# Patient Record
Sex: Female | Born: 1935 | Race: Black or African American | Hispanic: No | Marital: Married | Smoking: Never smoker
Health system: Southern US, Community
[De-identification: ages and names within clinical notes are randomized; demographics above are authoritative.]

## PROBLEM LIST (undated history)

## (undated) DIAGNOSIS — M65312 Trigger thumb, left thumb: Secondary | ICD-10-CM

## (undated) DIAGNOSIS — E213 Hyperparathyroidism, unspecified: Secondary | ICD-10-CM

## (undated) DIAGNOSIS — E7800 Pure hypercholesterolemia, unspecified: Secondary | ICD-10-CM

## (undated) DIAGNOSIS — I10 Essential (primary) hypertension: Secondary | ICD-10-CM

## (undated) DIAGNOSIS — J302 Other seasonal allergic rhinitis: Secondary | ICD-10-CM

## (undated) HISTORY — PX: TRIGGER FINGER RELEASE: SHX641

## (undated) HISTORY — PX: OTHER SURGICAL HISTORY: SHX169

---

## 1973-10-05 HISTORY — PX: ABDOMINAL HYSTERECTOMY: SHX81

## 1998-08-01 ENCOUNTER — Ambulatory Visit (HOSPITAL_COMMUNITY): Admission: RE | Admit: 1998-08-01 | Discharge: 1998-08-01 | Payer: Self-pay | Admitting: Internal Medicine

## 1998-08-01 ENCOUNTER — Encounter: Payer: Self-pay | Admitting: Internal Medicine

## 1999-04-03 ENCOUNTER — Ambulatory Visit (HOSPITAL_BASED_OUTPATIENT_CLINIC_OR_DEPARTMENT_OTHER): Admission: RE | Admit: 1999-04-03 | Discharge: 1999-04-03 | Payer: Self-pay | Admitting: Orthopedic Surgery

## 1999-08-14 ENCOUNTER — Ambulatory Visit (HOSPITAL_COMMUNITY): Admission: RE | Admit: 1999-08-14 | Discharge: 1999-08-14 | Payer: Self-pay | Admitting: Internal Medicine

## 1999-08-14 ENCOUNTER — Encounter: Payer: Self-pay | Admitting: Internal Medicine

## 2002-01-03 ENCOUNTER — Other Ambulatory Visit: Admission: RE | Admit: 2002-01-03 | Discharge: 2002-01-03 | Payer: Self-pay | Admitting: *Deleted

## 2003-02-12 ENCOUNTER — Ambulatory Visit (HOSPITAL_COMMUNITY): Admission: RE | Admit: 2003-02-12 | Discharge: 2003-02-12 | Payer: Self-pay | Admitting: Internal Medicine

## 2003-02-12 ENCOUNTER — Encounter: Payer: Self-pay | Admitting: Internal Medicine

## 2003-02-13 ENCOUNTER — Other Ambulatory Visit: Admission: RE | Admit: 2003-02-13 | Discharge: 2003-02-13 | Payer: Self-pay | Admitting: *Deleted

## 2003-11-19 ENCOUNTER — Ambulatory Visit (HOSPITAL_COMMUNITY): Admission: RE | Admit: 2003-11-19 | Discharge: 2003-11-19 | Payer: Self-pay | Admitting: Internal Medicine

## 2004-02-13 ENCOUNTER — Other Ambulatory Visit: Admission: RE | Admit: 2004-02-13 | Discharge: 2004-02-13 | Payer: Self-pay | Admitting: *Deleted

## 2007-09-12 ENCOUNTER — Other Ambulatory Visit: Admission: RE | Admit: 2007-09-12 | Discharge: 2007-09-12 | Payer: Self-pay | Admitting: *Deleted

## 2008-10-17 ENCOUNTER — Other Ambulatory Visit: Admission: RE | Admit: 2008-10-17 | Discharge: 2008-10-17 | Payer: Self-pay | Admitting: Gynecology

## 2011-12-02 ENCOUNTER — Ambulatory Visit (HOSPITAL_COMMUNITY)
Admission: RE | Admit: 2011-12-02 | Discharge: 2011-12-02 | Disposition: A | Discharge status: Home / Self Care | Payer: PRIVATE HEALTH INSURANCE | Source: Ambulatory Visit | Attending: Internal Medicine | Admitting: Internal Medicine

## 2011-12-02 DIAGNOSIS — M7989 Other specified soft tissue disorders: Secondary | ICD-10-CM

## 2011-12-02 DIAGNOSIS — R609 Edema, unspecified: Secondary | ICD-10-CM

## 2011-12-02 DIAGNOSIS — M79609 Pain in unspecified limb: Secondary | ICD-10-CM

## 2011-12-02 NOTE — Progress Notes (Signed)
Bilateral lower extremity venous duplex completed.  Preliminary report is negative for DVT, SVT, or a Baker's cyst. 

## 2012-12-03 DIAGNOSIS — M65312 Trigger thumb, left thumb: Secondary | ICD-10-CM

## 2012-12-03 HISTORY — DX: Trigger thumb, left thumb: M65.312

## 2012-12-06 ENCOUNTER — Other Ambulatory Visit: Payer: Self-pay | Admitting: Orthopedic Surgery

## 2012-12-07 ENCOUNTER — Other Ambulatory Visit: Payer: Self-pay | Admitting: Orthopedic Surgery

## 2012-12-08 ENCOUNTER — Other Ambulatory Visit: Payer: Self-pay

## 2012-12-08 ENCOUNTER — Encounter (HOSPITAL_BASED_OUTPATIENT_CLINIC_OR_DEPARTMENT_OTHER): Payer: Self-pay | Admitting: *Deleted

## 2012-12-08 ENCOUNTER — Encounter (HOSPITAL_BASED_OUTPATIENT_CLINIC_OR_DEPARTMENT_OTHER)
Admission: RE | Admit: 2012-12-08 | Discharge: 2012-12-08 | Disposition: A | Discharge status: Home / Self Care | Payer: Medicare Other | Source: Ambulatory Visit | Attending: Orthopedic Surgery | Admitting: Orthopedic Surgery

## 2012-12-08 LAB — BASIC METABOLIC PANEL
BUN: 14 mg/dL (ref 6–23)
Chloride: 102 mEq/L (ref 96–112)
Creatinine, Ser: 0.71 mg/dL (ref 0.50–1.10)
GFR calc Af Amer: >90 mL/min (ref >90)
Glucose, Bld: 98 mg/dL (ref 70–99)
Potassium: 3.6 mEq/L (ref 3.5–5.1)

## 2012-12-08 NOTE — Pre-Procedure Instructions (Signed)
To come for BMET and EKG 

## 2012-12-12 ENCOUNTER — Ambulatory Visit (HOSPITAL_BASED_OUTPATIENT_CLINIC_OR_DEPARTMENT_OTHER)
Admission: RE | Admit: 2012-12-12 | Discharge: 2012-12-12 | Disposition: A | Discharge status: Home / Self Care | Payer: Medicare Other | Source: Ambulatory Visit | Attending: Orthopedic Surgery | Admitting: Orthopedic Surgery

## 2012-12-12 ENCOUNTER — Encounter (HOSPITAL_BASED_OUTPATIENT_CLINIC_OR_DEPARTMENT_OTHER): Payer: Self-pay | Admitting: Anesthesiology

## 2012-12-12 ENCOUNTER — Encounter (HOSPITAL_BASED_OUTPATIENT_CLINIC_OR_DEPARTMENT_OTHER): Payer: Self-pay | Admitting: *Deleted

## 2012-12-12 ENCOUNTER — Encounter (HOSPITAL_BASED_OUTPATIENT_CLINIC_OR_DEPARTMENT_OTHER)
Admission: RE | Disposition: A | Discharge status: Home / Self Care | Payer: Self-pay | Source: Ambulatory Visit | Attending: Orthopedic Surgery

## 2012-12-12 ENCOUNTER — Ambulatory Visit (HOSPITAL_BASED_OUTPATIENT_CLINIC_OR_DEPARTMENT_OTHER): Payer: Medicare Other | Admitting: Anesthesiology

## 2012-12-12 DIAGNOSIS — J309 Allergic rhinitis, unspecified: Secondary | ICD-10-CM | POA: Insufficient documentation

## 2012-12-12 DIAGNOSIS — I10 Essential (primary) hypertension: Secondary | ICD-10-CM | POA: Insufficient documentation

## 2012-12-12 DIAGNOSIS — Z7982 Long term (current) use of aspirin: Secondary | ICD-10-CM | POA: Insufficient documentation

## 2012-12-12 DIAGNOSIS — Z79899 Other long term (current) drug therapy: Secondary | ICD-10-CM | POA: Insufficient documentation

## 2012-12-12 DIAGNOSIS — M6530 Trigger finger, unspecified finger: Secondary | ICD-10-CM | POA: Insufficient documentation

## 2012-12-12 HISTORY — PX: TRIGGER FINGER RELEASE: SHX641

## 2012-12-12 HISTORY — DX: Other seasonal allergic rhinitis: J30.2

## 2012-12-12 HISTORY — DX: Trigger thumb, left thumb: M65.312

## 2012-12-12 HISTORY — DX: Essential (primary) hypertension: I10

## 2012-12-12 HISTORY — DX: Pure hypercholesterolemia, unspecified: E78.00

## 2012-12-12 SURGERY — RELEASE, A1 PULLEY, FOR TRIGGER FINGER
Anesthesia: General | Site: Thumb | Laterality: Left | Wound class: Clean

## 2012-12-12 MED ORDER — ONDANSETRON HCL 4 MG/2ML IJ SOLN
INTRAMUSCULAR | Status: DC | PRN
  Administered 2012-12-12: 4 mg via INTRAVENOUS

## 2012-12-12 MED ORDER — FENTANYL CITRATE 0.05 MG/ML IJ SOLN
INTRAMUSCULAR | Status: DC | PRN
  Administered 2012-12-12: 50 ug via INTRAVENOUS

## 2012-12-12 MED ORDER — CHLORHEXIDINE GLUCONATE 4 % EX LIQD: 60.0000 mL | Freq: Once | CUTANEOUS | Status: DC

## 2012-12-12 MED ORDER — MIDAZOLAM HCL 2 MG/2ML IJ SOLN: 1.0000 mg | INTRAMUSCULAR | Status: DC | PRN

## 2012-12-12 MED ORDER — OXYCODONE HCL 5 MG PO TABS: 5.0000 mg | ORAL_TABLET | Freq: Once | ORAL | Status: DC | PRN

## 2012-12-12 MED ORDER — MIDAZOLAM HCL 2 MG/ML PO SYRP: 12.0000 mg | ORAL_SOLUTION | Freq: Once | ORAL | Status: DC | PRN

## 2012-12-12 MED ORDER — LACTATED RINGERS IV SOLN
INTRAVENOUS | Status: DC
  Administered 2012-12-12: 13:00:00 via INTRAVENOUS

## 2012-12-12 MED ORDER — CEFAZOLIN SODIUM-DEXTROSE 2-3 GM-% IV SOLR
2.0000 g | INTRAVENOUS | Status: DC
  Administered 2012-12-12: 2 g via INTRAVENOUS

## 2012-12-12 MED ORDER — PROPOFOL 10 MG/ML IV EMUL
INTRAVENOUS | Status: DC | PRN
  Administered 2012-12-12: 50 ug/kg/min via INTRAVENOUS

## 2012-12-12 MED ORDER — FENTANYL CITRATE 0.05 MG/ML IJ SOLN: 50.0000 ug | INTRAMUSCULAR | Status: DC | PRN

## 2012-12-12 MED ORDER — OXYCODONE HCL 5 MG/5ML PO SOLN: 5.0000 mg | Freq: Once | ORAL | Status: DC | PRN

## 2012-12-12 MED ORDER — DEXAMETHASONE SODIUM PHOSPHATE 4 MG/ML IJ SOLN
INTRAMUSCULAR | Status: DC | PRN
  Administered 2012-12-12: 10 mg via INTRAVENOUS

## 2012-12-12 MED ORDER — BUPIVACAINE HCL (PF) 0.25 % IJ SOLN
INTRAMUSCULAR | Status: DC | PRN
  Administered 2012-12-12: 5 mL

## 2012-12-12 MED ORDER — CEFAZOLIN SODIUM-DEXTROSE 2-3 GM-% IV SOLR: 2.0000 g | INTRAVENOUS | Status: DC

## 2012-12-12 MED ORDER — LIDOCAINE HCL (PF) 0.5 % IJ SOLN
INTRAMUSCULAR | Status: DC | PRN
  Administered 2012-12-12: 35 mL via INTRAVENOUS

## 2012-12-12 MED ORDER — HYDROMORPHONE HCL PF 1 MG/ML IJ SOLN: 0.2500 mg | INTRAMUSCULAR | Status: DC | PRN

## 2012-12-12 MED ORDER — LIDOCAINE HCL (CARDIAC) 20 MG/ML IV SOLN
INTRAVENOUS | Status: DC | PRN
  Administered 2012-12-12: 30 mg via INTRAVENOUS

## 2012-12-12 MED ORDER — HYDROCODONE-ACETAMINOPHEN 5-325 MG PO TABS: 1.0000 | ORAL_TABLET | Freq: Four times a day (QID) | ORAL | Status: DC | PRN

## 2012-12-12 SURGICAL SUPPLY — 35 items
BANDAGE COBAN STERILE 2 (GAUZE/BANDAGES/DRESSINGS) ×2 IMPLANT
BLADE SURG 15 STRL LF DISP TIS (BLADE) ×1 IMPLANT
BLADE SURG 15 STRL SS (BLADE) ×1
BNDG ESMARK 4X9 LF (GAUZE/BANDAGES/DRESSINGS) IMPLANT
CHLORAPREP W/TINT 26ML (MISCELLANEOUS) ×2 IMPLANT
CLOTH BEACON ORANGE TIMEOUT ST (SAFETY) ×2 IMPLANT
CORDS BIPOLAR (ELECTRODE) IMPLANT
COVER MAYO STAND STRL (DRAPES) ×2 IMPLANT
COVER TABLE BACK 60X90 (DRAPES) ×2 IMPLANT
CUFF TOURNIQUET SINGLE 18IN (TOURNIQUET CUFF) ×2 IMPLANT
DECANTER SPIKE VIAL GLASS SM (MISCELLANEOUS) IMPLANT
DRAPE EXTREMITY T 121X128X90 (DRAPE) ×2 IMPLANT
DRAPE SURG 17X23 STRL (DRAPES) ×2 IMPLANT
GAUZE XEROFORM 1X8 LF (GAUZE/BANDAGES/DRESSINGS) ×2 IMPLANT
GLOVE BIO SURGEON STRL SZ 6.5 (GLOVE) IMPLANT
GLOVE BIOGEL PI IND STRL 8.5 (GLOVE) ×1 IMPLANT
GLOVE BIOGEL PI INDICATOR 8.5 (GLOVE) ×1
GLOVE SKINSENSE NS SZ7.0 (GLOVE) ×1
GLOVE SKINSENSE STRL SZ7.0 (GLOVE) ×1 IMPLANT
GLOVE SURG ORTHO 8.0 STRL STRW (GLOVE) ×2 IMPLANT
GOWN BRE IMP PREV XXLGXLNG (GOWN DISPOSABLE) ×2 IMPLANT
GOWN PREVENTION PLUS XLARGE (GOWN DISPOSABLE) ×2 IMPLANT
NEEDLE 27GAX1X1/2 (NEEDLE) ×2 IMPLANT
NS IRRIG 1000ML POUR BTL (IV SOLUTION) ×2 IMPLANT
PACK BASIN DAY SURGERY FS (CUSTOM PROCEDURE TRAY) ×2 IMPLANT
PADDING CAST ABS 4INX4YD NS (CAST SUPPLIES) ×1
PADDING CAST ABS COTTON 4X4 ST (CAST SUPPLIES) ×1 IMPLANT
SPONGE GAUZE 4X4 12PLY (GAUZE/BANDAGES/DRESSINGS) ×2 IMPLANT
STOCKINETTE 4X48 STRL (DRAPES) ×2 IMPLANT
SUT VICRYL RAPIDE 4/0 PS 2 (SUTURE) ×2 IMPLANT
SYR BULB 3OZ (MISCELLANEOUS) ×2 IMPLANT
SYR CONTROL 10ML LL (SYRINGE) ×2 IMPLANT
TOWEL OR 17X24 6PK STRL BLUE (TOWEL DISPOSABLE) ×4 IMPLANT
UNDERPAD 30X30 INCONTINENT (UNDERPADS AND DIAPERS) ×2 IMPLANT
WATER STERILE IRR 1000ML POUR (IV SOLUTION) IMPLANT

## 2012-12-12 NOTE — Anesthesia Postprocedure Evaluation (Signed)
  Anesthesia Post-op Note  Patient: Erica Meadows  Procedure(s) Performed: Procedure(s) with comments: RELEASE TRIGGER FINGER/A-1 PULLEY LEFT THUMB (Left) - ANESTHESIA: IV REGIONAL FAB  Patient Location: PACU  Anesthesia Type:MAC and Bier block  Level of Consciousness: awake and alert   Airway and Oxygen Therapy: Patient Spontanous Breathing  Post-op Pain: mild  Post-op Assessment: Post-op Vital signs reviewed, Patient's Cardiovascular Status Stable, Respiratory Function Stable, Patent Airway, No signs of Nausea or vomiting, Adequate PO intake and Pain level controlled  Post-op Vital Signs: stable  Complications: No apparent anesthesia complications

## 2012-12-12 NOTE — Op Note (Signed)
Other Dictation: Dictation Number [id number]

## 2012-12-12 NOTE — Brief Op Note (Signed)
12/12/2012  2:17 PM  PATIENT:  Erica Meadows  77 y.o. female  PRE-OPERATIVE DIAGNOSIS:  LOCKED LEFT TRIGGER THUMB  POST-OPERATIVE DIAGNOSIS:  LOCKED LEFT TRIGGER THUMB  PROCEDURE:  Procedure(s) with comments: RELEASE TRIGGER FINGER/A-1 PULLEY LEFT THUMB (Left) - ANESTHESIA: IV REGIONAL FAB  SURGEON:  Surgeon(s) and Role:    * Nicki Reaper, MD - Primary  PHYSICIAN ASSISTANT:   ASSISTANTS: none   ANESTHESIA:   local and regional  EBL:  Total I/O In: 600 [I.V.:600] Out: -   BLOOD ADMINISTERED:none  DRAINS: none   LOCAL MEDICATIONS USED:  MARCAINE     SPECIMEN:  No Specimen  DISPOSITION OF SPECIMEN:  N/A  COUNTS:  YES  TOURNIQUET:   Total Tourniquet Time Documented: Forearm (Left) - 17 minutes Total: Forearm (Left) - 17 minutes   DICTATION: .Other Dictation: Dictation Number [id number]  PLAN OF CARE: Discharge to home after PACU  PATIENT DISPOSITION:  PACU - hemodynamically stable.

## 2012-12-12 NOTE — H&P (Signed)
Erica Meadows is 77 years-old.  She is right-hand dominant.  She complains of her left thumb being unable to bend. This has been going on for two months.  She is status post release of her A-1 pulley by myself ten years ago.  She has no history of injury on her left side. She has no history of diabetes, thyroid problems, arthritis or gout.  She complains of an intermittent, moderate, throbbing pain. She states it had popped for a period of time, but now no longer moves and her pain has disappeared.  She states that she can fully flex it with using her opposite hand.  Activity makes it worse, rest makes it better.    ALLERGIES:  None.  MEDICATIONS:   Aspirin, Crestor, Atenolol, Losartan, amlodipine, furosemide.  FAMILY MEDICAL HISTORY:   Negative.  SOCIAL HISTORY:   She does not smoke or drink, she is retired.  REVIEW OF SYSTEMS:   Positive for glasses, high blood pressure, otherwise negative 14 points.  Erica Meadows is an 77 y.o. female.   Chief Complaint: sts lt thumb HPI: see above  Past Medical History  Diagnosis Date  . High cholesterol   . Hypertension     under control; has been on med. x 15-20 yr.  . Seasonal allergies   . Trigger thumb of left hand 12/2012    Past Surgical History  Procedure Laterality Date  . Trigger finger release Right   . Abdominal hysterectomy  1975    partial    History reviewed. No pertinent family history. Social History:  reports that she has never smoked. She has never used smokeless tobacco. She reports that she does not drink alcohol or use illicit drugs.  Allergies: No Known Allergies  Medications Prior to Admission  Medication Sig Dispense Refill  . amLODipine (NORVASC) 10 MG tablet Take 10 mg by mouth daily.      Marland Kitchen aspirin 81 MG tablet Take 81 mg by mouth daily.      Marland Kitchen atenolol (TENORMIN) 25 MG tablet Take 25 mg by mouth daily.      . furosemide (LASIX) 20 MG tablet Take 20 mg by mouth 2 (two) times daily.      Marland Kitchen  losartan-hydrochlorothiazide (HYZAAR) 100-12.5 MG per tablet Take 1 tablet by mouth daily.      . rosuvastatin (CRESTOR) 10 MG tablet Take 10 mg by mouth daily.        Results for orders placed during the hospital encounter of 12/12/12 (from the past 48 hour(s))  POCT HEMOGLOBIN-HEMACUE     Status: None   Collection Time    12/12/12 12:55 PM      Result Value Range   Hemoglobin 13.7  12.0 - 15.0 g/dL    No results found.   Pertinent items are noted in HPI.  Blood pressure 148/67, pulse 58, temperature 98.5 F (36.9 C), temperature source Oral, resp. rate 16, height 5\' 2"  (1.575 m), weight 66.792 kg (147 lb 4 oz), SpO2 98.00%.  General appearance: alert, cooperative and appears stated age Head: Normocephalic, without obvious abnormality Neck: no JVD Resp: clear to auscultation bilaterally Cardio: regular rate and rhythm, S1, S2 normal, no murmur, click, rub or gallop GI: soft, non-tender; bowel sounds normal; no masses,  no organomegaly Extremities: normal Pulses: 2+ and symmetric Skin: Skin color, texture, turgor normal. No rashes or lesions Neurologic: Grossly normal Incision/Wound: na  Assessment/Plan DIAGNOSIS:   Stenosing tenosynovitis, locked left thumb trigger.    RECOMMENDATIONS/PLAN:  She would  like to have this surgically released and is advised of the risks and complications including infection, recurrence, injury to arteries, nerves, tendons, incomplete relief of symptoms and dystrophy.  She is scheduled for release A-1 pulley left thumb as an outpatient under regional anesthesia.  KUZMA,GARY R 12/12/2012, 1:42 PM

## 2012-12-12 NOTE — Anesthesia Procedure Notes (Signed)
Procedure Name: MAC Performed by: PEARSON, DONNA W Pre-anesthesia Checklist: Patient identified, Timeout performed, Emergency Drugs available, Suction available and Patient being monitored Patient Re-evaluated:Patient Re-evaluated prior to inductionOxygen Delivery Method: Simple face mask Placement Confirmation: positive ETCO2     

## 2012-12-12 NOTE — Transfer of Care (Signed)
Immediate Anesthesia Transfer of Care Note  Patient: Erica Meadows  Procedure(s) Performed: Procedure(s) with comments: RELEASE TRIGGER FINGER/A-1 PULLEY LEFT THUMB (Left) - ANESTHESIA: IV REGIONAL FAB  Patient Location: PACU  Anesthesia Type:MAC and Bier block  Level of Consciousness: awake, alert  and oriented  Airway & Oxygen Therapy: Patient Spontanous Breathing and Patient connected to face mask oxygen  Post-op Assessment: Report given to PACU RN and Post -op Vital signs reviewed and stable  Post vital signs: Reviewed and stable  Complications: No apparent anesthesia complications

## 2012-12-12 NOTE — Anesthesia Preprocedure Evaluation (Addendum)
Anesthesia Evaluation  Patient identified by MRN, date of birth, ID band Patient awake    Reviewed: Allergy & Precautions, H&P , NPO status , Patient's Chart, lab work & pertinent test results  Airway Mallampati: I TM Distance: >3 FB Neck ROM: Full    Dental   Pulmonary  breath sounds clear to auscultation        Cardiovascular hypertension, Rhythm:Regular Rate:Normal     Neuro/Psych    GI/Hepatic   Endo/Other    Renal/GU      Musculoskeletal   Abdominal   Peds  Hematology   Anesthesia Other Findings   Reproductive/Obstetrics                           Anesthesia Physical Anesthesia Plan  ASA: II  Anesthesia Plan: MAC and Bier Block   Post-op Pain Management:    Induction: Intravenous  Airway Management Planned: Simple Face Mask  Additional Equipment:   Intra-op Plan:   Post-operative Plan:   Informed Consent: I have reviewed the patients History and Physical, chart, labs and discussed the procedure including the risks, benefits and alternatives for the proposed anesthesia with the patient or authorized representative who has indicated his/her understanding and acceptance.     Plan Discussed with: CRNA and Surgeon  Anesthesia Plan Comments:        Anesthesia Quick Evaluation

## 2012-12-13 ENCOUNTER — Encounter (HOSPITAL_BASED_OUTPATIENT_CLINIC_OR_DEPARTMENT_OTHER): Payer: Self-pay | Admitting: Orthopedic Surgery

## 2012-12-13 NOTE — Op Note (Signed)
NAMEARMANDINA, Erica Meadows              ACCOUNT NO.:  [id number]  MEDICAL RECORD NO.:  [id number]  LOCATION:                                 FACILITY:  PHYSICIAN:  Cindee Salt, M.D.            DATE OF BIRTH:  DATE OF PROCEDURE:  12/12/2012 DATE OF DISCHARGE:                              OPERATIVE REPORT   PREOPERATIVE DIAGNOSIS:  Stenosing tenosynovitis, left thumb.  POSTOPERATIVE DIAGNOSIS:  Stenosing tenosynovitis, left thumb.  OPERATION:  Release A1 pulley, left thumb.  SURGEON:  Cindee Salt, M.D.  ANESTHESIA:  Forearm-based IV regional with local infiltration.  ANESTHESIOLOGIST:  Sheldon Silvan, M.D.  HISTORY:  The patient is a 77 year old female with a history of triggering of her left thumb.  This has not responded to conservative treatment.  She has elected to undergo surgical release.  Pre, peri, and postoperative course have been discussed along with the risks and complications.  She is aware that there is no guarantee with the surgery; possibility of infection; recurrence of injury to arteries, nerves, tendons, incomplete relief of symptoms, dystrophy.  In the preoperative area, the patient is seen.  The extremity was marked by both patient and surgeon.  Antibiotic given.  PROCEDURE:  The patient was brought to the operating room where a forearm-based IV regional anesthetic was carried out without difficulty. She was prepped using ChloraPrep, supine position, left arm free.  A 3- minute dry time was allowed.  Time-out taken, confirming patient and procedure.  A transverse incision was made over the A1 pulley left thumb carried down through subcutaneous tissue.  Bleeders were electrocauterized with bipolar.  The dissection carried down to the A1 pulley.  This was isolated, released on its radial aspect protecting the neurovascular bundles both radially and ulnarly.  The finger placed through a full range motion.  No further triggering was noted.  The wound was copiously  irrigated with saline.  The skin closed with interrupted 4-0 Vicryl Rapide sutures.  Local infiltration with 0.25% Marcaine without epinephrine was injected.  A sterile compressive dressing and no splint was applied.  On deflation of the tourniquet, all fingers immediately pinked.  She was taken to the recovery room for observation in satisfactory condition.  She will be discharged home to return to [facility] in 1 week on Norco.          ______________________________ Cindee Salt, M.D.     GK/MEDQ  D:  12/12/2012  T:  12/13/2012  Job:  [id number]

## 2014-06-19 ENCOUNTER — Ambulatory Visit
Admission: RE | Admit: 2014-06-19 | Discharge: 2014-06-19 | Disposition: A | Discharge status: Home / Self Care | Payer: Medicare Other | Source: Ambulatory Visit | Attending: Gastroenterology | Admitting: Gastroenterology

## 2014-06-19 ENCOUNTER — Encounter (INDEPENDENT_AMBULATORY_CARE_PROVIDER_SITE_OTHER): Payer: Self-pay

## 2014-06-19 ENCOUNTER — Other Ambulatory Visit: Payer: Self-pay | Admitting: Gastroenterology

## 2014-06-19 DIAGNOSIS — K635 Polyp of colon: Secondary | ICD-10-CM

## 2015-08-17 IMAGING — CR DG BE W/ AIR HIGH DENSITY
10 series · 10 of 10 positions shown · non-contrast
Comparison: None.

CLINICAL DATA: Incomplete colonoscopy earlier today. Patient had a
benign polyp removed 5 years ago at colonoscopy.

EXAM:
AIR CONTRAST BARIUM ENEMA
TECHNIQUE: Initial scout AP supine abdominal image obtained to insure adequate
colon cleansing. Barium was introduced into the colon in a
retrograde fashion and refluxed from the rectum to the distal
transverse colon. As much of the barium as possible was then removed
through the indwelling tube via gravity drain. Air was then
insufflated into the colon. Spot images of the colon followed by
overhead radiographs were obtained.
FLUOROSCOPY TIME:  4 min 42 sec, pulsed fluoroscopy.

[run (1 of 10)]
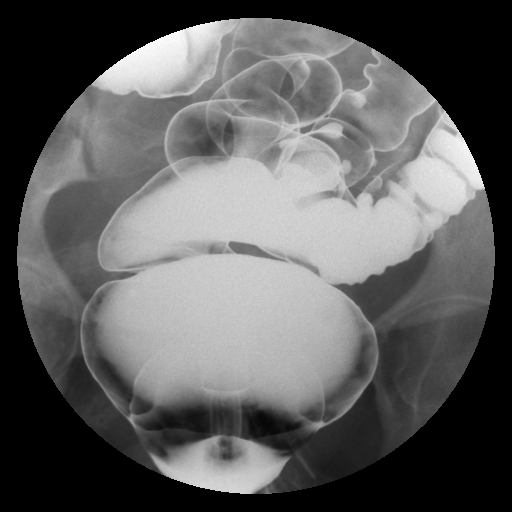

[run (2 of 10)]
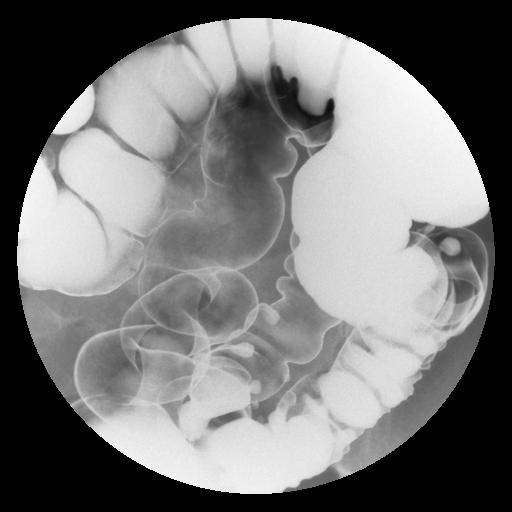

[run (3 of 10)]
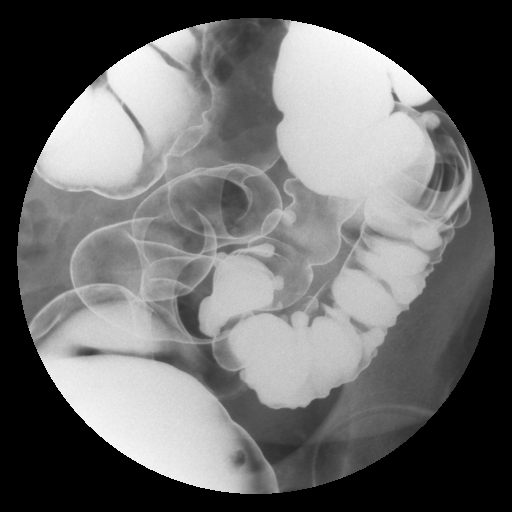

[run (4 of 10)]
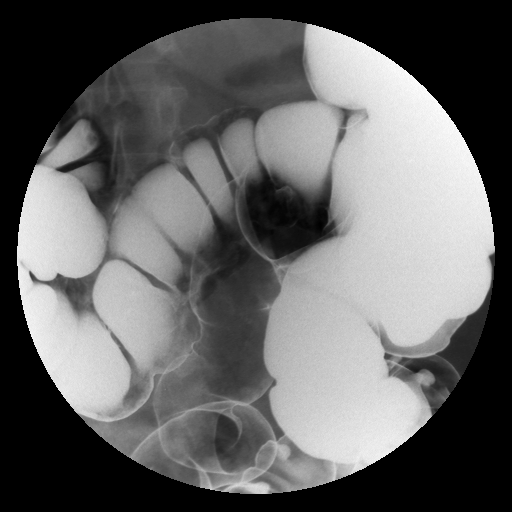

[run (5 of 10)]
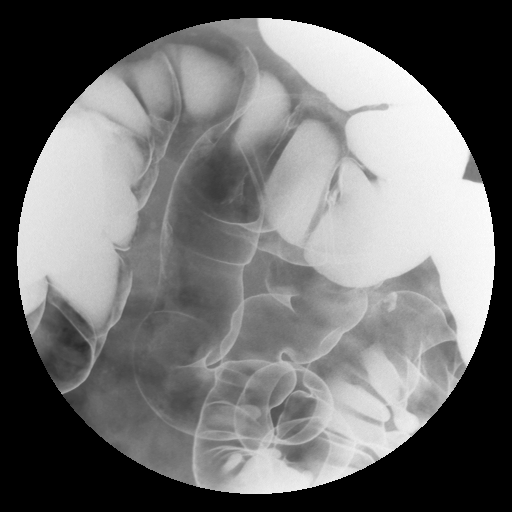

[run (6 of 10)]
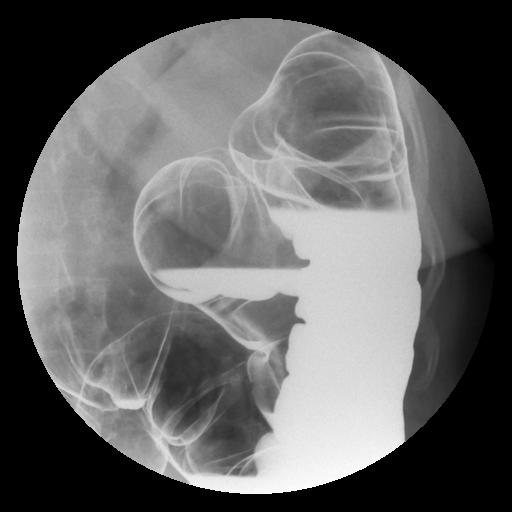

[run (7 of 10)]
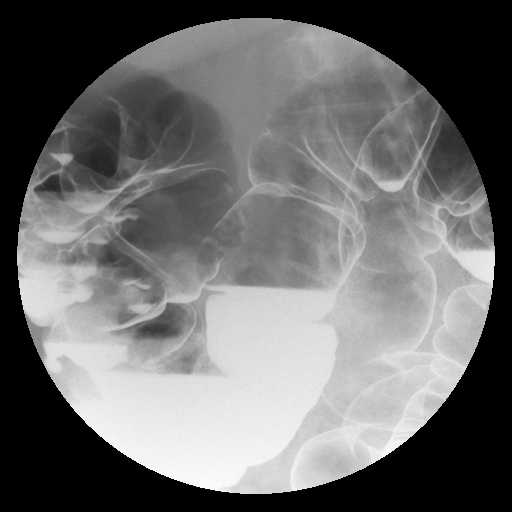

[run (8 of 10)]
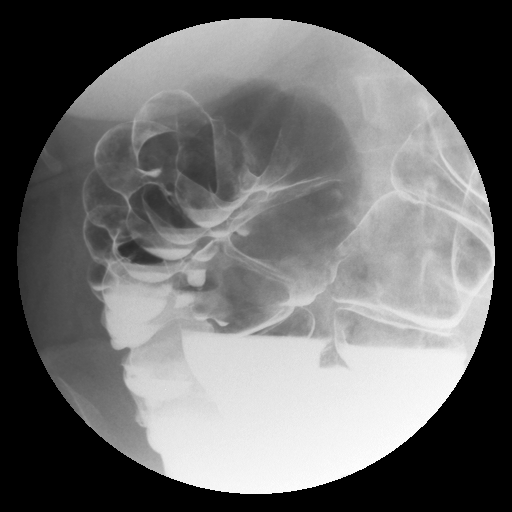

[run (9 of 10)]
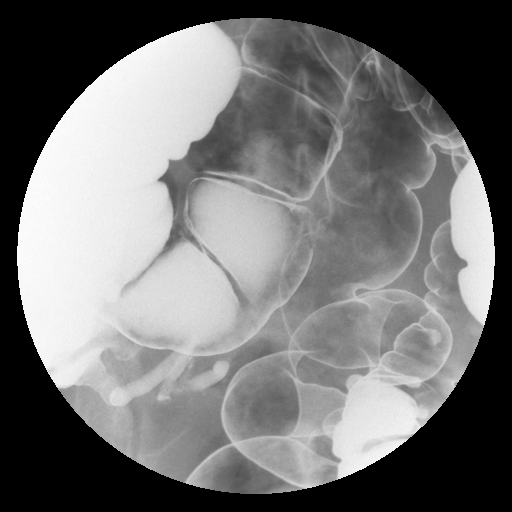

[run (10 of 10)]
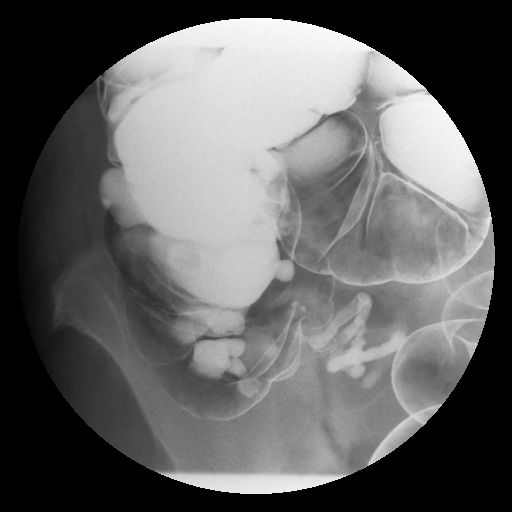

[10 of 10 positions shown; findings below may reference images not displayed]

FINDINGS: Preliminary scout AP supine abdominal image demonstrates moderate
residual air throughout the colon with fluid levels in the colon. No
free intraperitoneal air. Degenerative changes involving the lumbar
spine, sacroiliac joints, symphysis pubis and hips.

Markedly tortuous and redundant sigmoid colon containing numerous
diverticula. Benign appearing sessile polyp involving the mid
sigmoid colon arising from the right lateral wall. No visible polyps
or masses elsewhere. Diverticulosis involving the cecum and
ascending colon. Appendix fills normally with the barium mixture,
and diverticula are identified arising from the appendix. Post
evacuation image demonstrates near complete evacuation of the
rectum.
IMPRESSION: 1. Benign appearing sessile polyp arising from the mid sigmoid colon
along its right lateral wall.
2. No visible polyps or masses elsewhere in the colon.
3. Diverticulosis involving the sigmoid colon, cecum and ascending
colon. Note also made of diverticula arising from the appendix.

## 2018-01-20 ENCOUNTER — Other Ambulatory Visit (HOSPITAL_COMMUNITY): Payer: Self-pay | Admitting: Internal Medicine

## 2018-01-21 ENCOUNTER — Other Ambulatory Visit (HOSPITAL_COMMUNITY): Payer: Self-pay | Admitting: Internal Medicine

## 2018-02-22 ENCOUNTER — Other Ambulatory Visit (HOSPITAL_COMMUNITY): Payer: Self-pay | Admitting: Internal Medicine

## 2018-02-22 DIAGNOSIS — E213 Hyperparathyroidism, unspecified: Secondary | ICD-10-CM

## 2018-03-03 ENCOUNTER — Encounter (HOSPITAL_COMMUNITY): Payer: Self-pay | Admitting: Emergency Medicine

## 2018-03-03 ENCOUNTER — Emergency Department (HOSPITAL_COMMUNITY)
Admission: EM | Admit: 2018-03-03 | Discharge: 2018-03-03 | Disposition: A | Discharge status: Home / Self Care | Payer: Medicare Other | Attending: Emergency Medicine | Admitting: Emergency Medicine

## 2018-03-03 DIAGNOSIS — Y93G1 Activity, food preparation and clean up: Secondary | ICD-10-CM | POA: Diagnosis not present

## 2018-03-03 DIAGNOSIS — S61412A Laceration without foreign body of left hand, initial encounter: Secondary | ICD-10-CM | POA: Diagnosis not present

## 2018-03-03 DIAGNOSIS — Z23 Encounter for immunization: Secondary | ICD-10-CM | POA: Diagnosis not present

## 2018-03-03 DIAGNOSIS — W260XXA Contact with knife, initial encounter: Secondary | ICD-10-CM | POA: Diagnosis not present

## 2018-03-03 DIAGNOSIS — S61422A Laceration with foreign body of left hand, initial encounter: Secondary | ICD-10-CM

## 2018-03-03 DIAGNOSIS — I10 Essential (primary) hypertension: Secondary | ICD-10-CM | POA: Diagnosis not present

## 2018-03-03 DIAGNOSIS — Y999 Unspecified external cause status: Secondary | ICD-10-CM | POA: Insufficient documentation

## 2018-03-03 DIAGNOSIS — Y92010 Kitchen of single-family (private) house as the place of occurrence of the external cause: Secondary | ICD-10-CM | POA: Diagnosis not present

## 2018-03-03 MED ORDER — TETANUS-DIPHTH-ACELL PERTUSSIS 5-2.5-18.5 LF-MCG/0.5 IM SUSP
0.5000 mL | Freq: Once | INTRAMUSCULAR | Status: AC
Start: 2018-03-03 — End: 2018-03-03
  Administered 2018-03-03: 0.5 mL via INTRAMUSCULAR
  Filled 2018-03-03: qty 0.5

## 2018-03-03 MED ORDER — BACITRACIN-NEOMYCIN-POLYMYXIN 400-5-5000 EX OINT
TOPICAL_OINTMENT | Freq: Once | CUTANEOUS | Status: AC
  Administered 2018-03-03: 22:00:00 via TOPICAL
  Filled 2018-03-03: qty 1

## 2018-03-03 MED ORDER — BACITRACIN ZINC 500 UNIT/GM EX OINT
TOPICAL_OINTMENT | CUTANEOUS | Status: AC
  Filled 2018-03-03: qty 4.5

## 2018-03-03 MED ORDER — LIDOCAINE HCL (PF) 1 % IJ SOLN
30.0000 mL | Freq: Once | INTRAMUSCULAR | Status: AC
  Administered 2018-03-03: 30 mL
  Filled 2018-03-03: qty 30

## 2018-03-03 NOTE — ED Provider Notes (Signed)
[facility] DEPT Provider Note   CSN: [id number] Arrival date & time: 03/03/18  [place]     History   Chief Complaint Chief Complaint  Patient presents with  . Laceration    HPI Erica Meadows is a 82 y.o. female here for evaluation of laceration to the left dorsal hand sustained immediately PTA.  She was using a knife while cooking and accidentally cut herself.  States she cannot raise her left index finger.  She denies paresthesias.  Unknown tetanus status.  She is right-hand dominant.  HPI  Past Medical History:  Diagnosis Date  . High cholesterol   . Hypertension    under control; has been on med. x 15-20 yr.  . Seasonal allergies   . Trigger thumb of left hand 12/2012    There are no active problems to display for this patient.   Past Surgical History:  Procedure Laterality Date  . ABDOMINAL HYSTERECTOMY  1975   partial  . TRIGGER FINGER RELEASE Right   . TRIGGER FINGER RELEASE Left 12/12/2012   Procedure: RELEASE TRIGGER FINGER/A-1 PULLEY LEFT THUMB;  Surgeon: Wynonia Sours, MD;  Location: [facility];  Service: Orthopedics;  Laterality: Left;  ANESTHESIA: IV REGIONAL FAB     OB History   None      Home Medications    Prior to Admission medications   Medication Sig Start Date End Date Taking? Authorizing Provider  amLODipine (NORVASC) 10 MG tablet Take 10 mg by mouth daily.   Yes [provider]  aspirin 81 MG tablet Take 81 mg by mouth every evening.    Yes [provider]  carvedilol (COREG) 12.5 MG tablet Take 12.5 mg by mouth 2 (two) times daily. 02/03/18  Yes [provider]  Cholecalciferol (VITAMIN D PO) Take 5,000 Units by mouth daily.   Yes [provider]  furosemide (LASIX) 20 MG tablet Take 20 mg by mouth daily.    Yes [provider]  KLOR-CON M20 20 MEQ tablet TAKE 1 TABLET BY MOUTH DAILY WITH MEAL 02/03/18  Yes [provider]  losartan (COZAAR) 100  MG tablet Take 100 mg by mouth every evening.  02/12/18  Yes [provider]  rosuvastatin (CRESTOR) 10 MG tablet Take 10 mg by mouth daily.   Yes [provider]  HYDROcodone-acetaminophen (NORCO) 5-325 MG per tablet Take 1 tablet by mouth every 6 (six) hours as needed for pain. Patient not taking: Reported on 03/03/2018 12/12/12   Daryll Brod, MD    Family History No family history on file.  Social History Social History   Tobacco Use  . Smoking status: Never Smoker  . Smokeless tobacco: Never Used  Substance Use Topics  . Alcohol use: No  . Drug use: No     Allergies   Patient has no known allergies.   Review of Systems Review of Systems  Skin: Positive for wound.  All other systems reviewed and are negative.    Physical Exam Updated Vital Signs BP (!) 159/75 (BP Location: Right Arm)   Pulse 63   Temp 98.7 F (37.1 C) (Oral)   Resp 16   SpO2 97%   Physical Exam  Constitutional: She is oriented to person, place, and time. She appears well-developed and well-nourished.  Non-toxic appearance.  HENT:  Head: Normocephalic.  Right Ear: External ear normal.  Left Ear: External ear normal.  Nose: Nose normal.  Eyes: Conjunctivae and EOM are normal.  Neck: Full passive  range of motion without pain.  Cardiovascular: Normal rate.  Brisk cap refill to left fingertips, fingertips warm.  Pulmonary/Chest: Effort normal. No tachypnea. No respiratory distress.  Musculoskeletal: Normal range of motion.  Patient has no extension at MCP of left index finger.  Normal flexion against resistance.  Full thumb opposition to all fingers.  Normal left wrist active AROM and strength against resistance.  Neurological: She is alert and oriented to person, place, and time.  Sensation to light touch intact in median, ulnar, radial nerve distribution bilaterally.  5/5 handgrip bilaterally.  Skin: Skin is warm and dry. Capillary refill takes less than 2 seconds.  2 cm  straight laceration to dorsal, base of second metacarpal, left.  Hemostatic.  Psychiatric: Her behavior is normal. Thought content normal.     ED Treatments / Results  Labs (all labs ordered are listed, but only abnormal results are displayed) Labs Reviewed - No data to display  EKG None  Radiology No results found.  Procedures .Marland KitchenLaceration Repair Date/Time: 03/03/2018 11:25 PM Performed by: Kinnie Feil, PA-C Authorized by: Kinnie Feil, PA-C   Consent:    Consent obtained:  Verbal   Consent given by:  Patient   Risks discussed:  Infection, pain, poor cosmetic result, tendon damage and need for additional repair   Alternatives discussed:  Referral and no treatment Anesthesia (see MAR for exact dosages):    Anesthesia method:  Local infiltration   Local anesthetic:  Lidocaine 1% w/o epi Laceration details:    Location:  Hand   Hand location:  L hand, dorsum   Length (cm):  3 Repair type:    Repair type:  Simple Pre-procedure details:    Preparation:  Patient was prepped and draped in usual sterile fashion and imaging obtained to evaluate for foreign bodies Exploration:    Hemostasis achieved with:  Direct pressure   Wound exploration: wound explored through full range of motion and entire depth of wound probed and visualized     Wound extent: tendon damage     Tendon damage location:  Upper extremity   Upper extremity tendon damage location:  Finger extensor   Finger extensor tendon:  Extensor indicis   Tendon damage extent:  Partial transection   Tendon repair plan:  Refer for evaluation   Contaminated: no   Treatment:    Area cleansed with:  Saline   Amount of cleaning:  Extensive   Irrigation solution:  Sterile saline   Irrigation volume:  500   Irrigation method:  Pressure wash, syringe and tap   Visualized foreign bodies/material removed: no   Skin repair:    Repair method:  Sutures   Suture size:  5-0   Wound skin closure material used:  ethilon.   Suture technique:  Simple interrupted   Number of sutures:  3 Approximation:    Approximation:  Close Post-procedure details:    Dressing:  Antibiotic ointment and splint for protection   Patient tolerance of procedure:  Tolerated well, no immediate complications   (including critical care time)  Medications Ordered in ED Medications  bacitracin 500 UNIT/GM ointment (has no administration in time range)  Tdap (BOOSTRIX) injection 0.5 mL (0.5 mLs Intramuscular Given 03/03/18 2039)  lidocaine (PF) (XYLOCAINE) 1 % injection 30 mL (30 mLs Infiltration Given by Other 03/03/18 2035)  neomycin-bacitracin-polymyxin (NEOSPORIN) ointment ( Topical Given 03/03/18 2200)     Initial Impression / Assessment and Plan / ED Course  I have reviewed the triage vital signs and the  nursing notes.  Pertinent labs & imaging results that were available during my care of the patient were reviewed by me and considered in my medical decision making (see chart for details).    Exam concerning for tendon injury.  Extremity is neurovascularly intact.  Tetanus updated.  I spoke to Dr. Lenon Curt who recommends follow-up tomorrow in clinic.  Patient was put in a volar splint to help with index neutral position/protection.  She was given contact information and is aware she needs to call Dr. Sundra Aland office for follow-up.  Patient and family at bedside are in agreement. Final Clinical Impressions(s) / ED Diagnoses   Final diagnoses:  Laceration of left hand with foreign body, initial encounter    ED Discharge Orders    None       Arlean Hopping 03/03/18 2336    Drenda Freeze, MD 03/03/18 [phone]

## 2018-03-03 NOTE — ED Triage Notes (Signed)
Patient reports cutting top of hand with knife today. Approximately two inch laceration to left hand. Unable to move left index finger. Bleeding controlled.

## 2018-03-03 NOTE — Discharge Instructions (Addendum)
Given exam, there is concern for injury to extensor tendon of your index finger.  Keep laceration covered and wear your splint until you are evaluated by Dr. Lenon Curt.  Call his office tomorrow at 8 AM to schedule an appointment within 24 to 48 hours.

## 2018-03-03 NOTE — ED Notes (Signed)
Bed: GB61 Expected date:  Expected time:  Means of arrival:  Comments: triage

## 2018-03-05 HISTORY — PX: OTHER SURGICAL HISTORY: SHX169

## 2018-03-07 DIAGNOSIS — S61412A Laceration without foreign body of left hand, initial encounter: Secondary | ICD-10-CM | POA: Diagnosis not present

## 2018-03-07 DIAGNOSIS — M79642 Pain in left hand: Secondary | ICD-10-CM | POA: Diagnosis not present

## 2018-03-08 DIAGNOSIS — S66822A Laceration of other specified muscles, fascia and tendons at wrist and hand level, left hand, initial encounter: Secondary | ICD-10-CM | POA: Diagnosis not present

## 2018-03-08 DIAGNOSIS — S66312A Strain of extensor muscle, fascia and tendon of right middle finger at wrist and hand level, initial encounter: Secondary | ICD-10-CM | POA: Diagnosis not present

## 2018-03-09 ENCOUNTER — Encounter (HOSPITAL_COMMUNITY): Payer: Medicare Other

## 2018-03-10 ENCOUNTER — Encounter (HOSPITAL_COMMUNITY): Payer: Medicare Other

## 2018-03-21 ENCOUNTER — Encounter (HOSPITAL_COMMUNITY): Payer: Medicare Other

## 2018-03-21 DIAGNOSIS — S61412S Laceration without foreign body of left hand, sequela: Secondary | ICD-10-CM | POA: Diagnosis not present

## 2018-03-22 ENCOUNTER — Encounter (HOSPITAL_COMMUNITY): Payer: Medicare Other

## 2018-03-28 ENCOUNTER — Other Ambulatory Visit (HOSPITAL_COMMUNITY): Payer: Self-pay | Admitting: Internal Medicine

## 2018-03-28 DIAGNOSIS — E213 Hyperparathyroidism, unspecified: Secondary | ICD-10-CM

## 2018-03-29 ENCOUNTER — Other Ambulatory Visit (HOSPITAL_COMMUNITY): Payer: Self-pay | Admitting: Internal Medicine

## 2018-03-29 ENCOUNTER — Encounter (HOSPITAL_COMMUNITY): Payer: Medicare Other

## 2018-03-29 ENCOUNTER — Ambulatory Visit (HOSPITAL_COMMUNITY): Payer: Medicare Other

## 2018-03-29 DIAGNOSIS — E213 Hyperparathyroidism, unspecified: Secondary | ICD-10-CM

## 2018-03-30 ENCOUNTER — Encounter (HOSPITAL_COMMUNITY): Payer: Medicare Other

## 2018-04-01 ENCOUNTER — Ambulatory Visit: Payer: Medicare Other | Admitting: Cardiovascular Disease

## 2018-04-01 ENCOUNTER — Telehealth: Payer: Self-pay | Admitting: Cardiovascular Disease

## 2018-04-01 ENCOUNTER — Encounter: Payer: Self-pay | Admitting: Cardiovascular Disease

## 2018-04-01 VITALS — BP 134/60 | HR 59 | Ht 62.0 in | Wt 146.0 lb

## 2018-04-01 DIAGNOSIS — M7989 Other specified soft tissue disorders: Principal | ICD-10-CM

## 2018-04-01 DIAGNOSIS — I10 Essential (primary) hypertension: Secondary | ICD-10-CM | POA: Diagnosis not present

## 2018-04-01 DIAGNOSIS — M79605 Pain in left leg: Secondary | ICD-10-CM

## 2018-04-01 DIAGNOSIS — E7800 Pure hypercholesterolemia, unspecified: Secondary | ICD-10-CM

## 2018-04-01 DIAGNOSIS — E785 Hyperlipidemia, unspecified: Secondary | ICD-10-CM | POA: Insufficient documentation

## 2018-04-01 NOTE — Telephone Encounter (Signed)
Spoke with patient and received verbal OK to speak with daughter Lanelle Bal. Patient was seen by Dr. Gwenlyn Found today - referred by PCP for HTN, LE edema. Daughter states patient was under the impression she was to see Dr. Gwenlyn Found and get an echocardiogram since she has had edema since end of May. Daughter is very upset that there was no additional work up done for her mom other than an EKG. Daughter was notified that Dr. Gwenlyn Found routed his office note to Dr. Baird Cancer so she can review his assessment & plan. Advised that she needs to call her PCP office as Dr. Gwenlyn Found has evaluated her already and did not recommend additional testing. Informed her that PCP can also order echo if she feels this is needed.   Daughter also would like it on record that she weighed 136lbs when she got home, when our chart reflects 146lbs during today's visit.

## 2018-04-01 NOTE — Patient Instructions (Signed)
Your physician recommends that you schedule a follow-up appointment in: AS NEEDED  

## 2018-04-01 NOTE — Assessment & Plan Note (Signed)
History of essential hypertension with blood pressure measured today at 134/60.  She is on amlodipine, carvedilol and losartan.  Continue current meds at current dosing.

## 2018-04-01 NOTE — Assessment & Plan Note (Signed)
History of hyperlipidemia on statin therapy. 

## 2018-04-01 NOTE — Assessment & Plan Note (Signed)
Mild dependent left lower extremity edema mostly towards her ankle which has improved recently.  On exam this is really not significant and no further work-up is required.

## 2018-04-01 NOTE — Telephone Encounter (Signed)
New message   Patient's daughter Glenda Chroman ([phone]) calling with concerns regarding office visit today.  States patient was under the impression from PCP she would be getting an ECHO done today, not an EKG. Also concerned that the weight listed in the chart as 146 is incorrect.

## 2018-04-01 NOTE — Progress Notes (Signed)
04/01/2018 Erica Meadows   1936-04-01  [id number]  Primary Physician Glendale Chard, MD Primary Cardiologist: Lorretta Harp MD Lupe Carney, [place]  HPI:  Erica Meadows is a 82 y.o. thin appearing married African-American female 2, grandmother of 4 grandchildren referred by Dr. Tye Savoy for cardiovascular evaluation because of hypertension and swelling.  She has no cardiac risk factors other than hypertension and hyperlipidemia medically treated.  She is never had a heart attack or stroke.  She denies chest pain or shortness of breath.  Blood pressure appears well-controlled on her current medications.  She does have some mild dependent left lower extremity edema mostly towards the ankle.  This is really not significant on today's exam.   Current Meds  Medication Sig  . amLODipine (NORVASC) 10 MG tablet Take 10 mg by mouth daily.  Marland Kitchen aspirin 81 MG tablet Take 81 mg by mouth every evening.   . carvedilol (COREG) 12.5 MG tablet Take 12.5 mg by mouth 2 (two) times daily.  . Cholecalciferol (VITAMIN D PO) Take 5,000 Units by mouth daily.  . furosemide (LASIX) 20 MG tablet Take 20 mg by mouth daily.   Marland Kitchen KLOR-CON M20 20 MEQ tablet TAKE 1 TABLET BY MOUTH DAILY WITH MEAL  . losartan (COZAAR) 100 MG tablet Take 100 mg by mouth every evening.   . rosuvastatin (CRESTOR) 10 MG tablet Take 10 mg by mouth daily.     No Known Allergies  Social History   Socioeconomic History  . Marital status: Married    Spouse name: Not on file  . Number of children: Not on file  . Years of education: Not on file  . Highest education level: Not on file  Occupational History  . Not on file  Social Needs  . Financial resource strain: Not on file  . Food insecurity:    Worry: Not on file    Inability: Not on file  . Transportation needs:    Medical: Not on file    Non-medical: Not on file  Tobacco Use  . Smoking status: Never Smoker  . Smokeless tobacco: Never Used  Substance and Sexual  Activity  . Alcohol use: No  . Drug use: No  . Sexual activity: Not on file  Lifestyle  . Physical activity:    Days per week: Not on file    Minutes per session: Not on file  . Stress: Not on file  Relationships  . Social connections:    Talks on phone: Not on file    Gets together: Not on file    Attends religious service: Not on file    Active member of club or organization: Not on file    Attends meetings of clubs or organizations: Not on file    Relationship status: Not on file  . Intimate partner violence:    Fear of current or ex partner: Not on file    Emotionally abused: Not on file    Physically abused: Not on file    Forced sexual activity: Not on file  Other Topics Concern  . Not on file  Social History Narrative  . Not on file     Review of Systems: General: negative for chills, fever, night sweats or weight changes.  Cardiovascular: negative for chest pain, dyspnea on exertion, edema, orthopnea, palpitations, paroxysmal nocturnal dyspnea or shortness of breath Dermatological: negative for rash Respiratory: negative for cough or wheezing Urologic: negative for hematuria Abdominal: negative for nausea, vomiting, diarrhea, bright  red blood per rectum, melena, or hematemesis Neurologic: negative for visual changes, syncope, or dizziness All other systems reviewed and are otherwise negative except as noted above.    Blood pressure 134/60, pulse (!) 59, height 5\' 2"  (1.575 m), weight 146 lb (66.2 kg).  General appearance: alert and no distress Neck: no adenopathy, no carotid bruit, no JVD, supple, symmetrical, trachea midline and thyroid not enlarged, symmetric, no tenderness/mass/nodules Lungs: clear to auscultation bilaterally Heart: regular rate and rhythm, S1, S2 normal, no murmur, click, rub or gallop Extremities: extremities normal, atraumatic, no cyanosis or edema Pulses: 2+ and symmetric Skin: Skin color, texture, turgor normal. No rashes or  lesions Neurologic: Alert and oriented X 3, normal strength and tone. Normal symmetric reflexes. Normal coordination and gait  EKG sinus bradycardia 59 with LVH and septal Q waves.  I personally reviewed this EKG.  ASSESSMENT AND PLAN:   Essential hypertension History of essential hypertension with blood pressure measured today at 134/60.  She is on amlodipine, carvedilol and losartan.  Continue current meds at current dosing.  Hyperlipidemia History of hyperlipidemia on statin therapy  Pain and swelling of left lower extremity Mild dependent left lower extremity edema mostly towards her ankle which has improved recently.  On exam this is really not significant and no further work-up is required.      Lorretta Harp MD FACP,FACC,FAHA, [facility] 04/01/2018 11:02 AM

## 2018-04-01 NOTE — Telephone Encounter (Signed)
New Message   Tawanda from [facility], wants to know if the pt will get scheduled for an echo because she states she requested it in the referral. Please call

## 2018-04-04 ENCOUNTER — Other Ambulatory Visit (HOSPITAL_COMMUNITY): Payer: Medicare Other

## 2018-04-04 ENCOUNTER — Encounter (HOSPITAL_COMMUNITY): Payer: Medicare Other

## 2018-04-04 NOTE — Telephone Encounter (Signed)
Okay to order echo?

## 2018-04-04 NOTE — Telephone Encounter (Signed)
Will forward to Dr Gwenlyn Found for review regarding ordering Echo

## 2018-04-04 NOTE — Telephone Encounter (Signed)
Left message for patient to call to schedule echocardiogram.

## 2018-04-04 NOTE — Telephone Encounter (Signed)
Spoke with pt, echo scheduled. 

## 2018-04-05 ENCOUNTER — Encounter (HOSPITAL_COMMUNITY)
Admission: RE | Admit: 2018-04-05 | Discharge: 2018-04-05 | Disposition: A | Discharge status: Home / Self Care | Payer: Medicare Other | Source: Ambulatory Visit | Attending: Internal Medicine | Admitting: Internal Medicine

## 2018-04-05 ENCOUNTER — Other Ambulatory Visit (HOSPITAL_COMMUNITY): Payer: Medicare Other

## 2018-04-05 DIAGNOSIS — E213 Hyperparathyroidism, unspecified: Secondary | ICD-10-CM | POA: Diagnosis not present

## 2018-04-05 MED ORDER — TECHNETIUM TC 99M SESTAMIBI GENERIC - CARDIOLITE
25.0000 | Freq: Once | INTRAVENOUS | Status: AC | PRN
  Administered 2018-04-05: 25 via INTRAVENOUS

## 2018-04-06 DIAGNOSIS — M79642 Pain in left hand: Secondary | ICD-10-CM | POA: Diagnosis not present

## 2018-04-08 ENCOUNTER — Other Ambulatory Visit: Payer: Self-pay

## 2018-04-08 ENCOUNTER — Ambulatory Visit (HOSPITAL_COMMUNITY): Payer: Medicare Other | Attending: Cardiovascular Disease

## 2018-04-08 DIAGNOSIS — R001 Bradycardia, unspecified: Secondary | ICD-10-CM | POA: Diagnosis not present

## 2018-04-08 DIAGNOSIS — M7989 Other specified soft tissue disorders: Secondary | ICD-10-CM | POA: Diagnosis not present

## 2018-04-08 DIAGNOSIS — M79605 Pain in left leg: Secondary | ICD-10-CM | POA: Insufficient documentation

## 2018-04-08 DIAGNOSIS — E785 Hyperlipidemia, unspecified: Secondary | ICD-10-CM | POA: Diagnosis not present

## 2018-04-08 DIAGNOSIS — I10 Essential (primary) hypertension: Secondary | ICD-10-CM | POA: Diagnosis not present

## 2018-04-08 DIAGNOSIS — I083 Combined rheumatic disorders of mitral, aortic and tricuspid valves: Secondary | ICD-10-CM | POA: Insufficient documentation

## 2018-04-11 ENCOUNTER — Ambulatory Visit (HOSPITAL_COMMUNITY): Payer: Medicare Other

## 2018-04-11 ENCOUNTER — Other Ambulatory Visit (HOSPITAL_COMMUNITY): Payer: Medicare Other

## 2018-04-11 DIAGNOSIS — M79642 Pain in left hand: Secondary | ICD-10-CM | POA: Diagnosis not present

## 2018-04-12 ENCOUNTER — Ambulatory Visit (HOSPITAL_COMMUNITY): Payer: Medicare Other

## 2018-04-12 DIAGNOSIS — E213 Hyperparathyroidism, unspecified: Secondary | ICD-10-CM | POA: Diagnosis not present

## 2018-04-12 DIAGNOSIS — R609 Edema, unspecified: Secondary | ICD-10-CM | POA: Diagnosis not present

## 2018-04-12 DIAGNOSIS — D351 Benign neoplasm of parathyroid gland: Secondary | ICD-10-CM | POA: Diagnosis not present

## 2018-04-12 DIAGNOSIS — I10 Essential (primary) hypertension: Secondary | ICD-10-CM | POA: Diagnosis not present

## 2018-04-12 DIAGNOSIS — Z79899 Other long term (current) drug therapy: Secondary | ICD-10-CM | POA: Diagnosis not present

## 2018-04-13 ENCOUNTER — Ambulatory Visit: Payer: Medicare Other | Admitting: Cardiology

## 2018-04-14 DIAGNOSIS — M79642 Pain in left hand: Secondary | ICD-10-CM | POA: Diagnosis not present

## 2018-04-18 DIAGNOSIS — M79642 Pain in left hand: Secondary | ICD-10-CM | POA: Diagnosis not present

## 2018-04-21 DIAGNOSIS — M79642 Pain in left hand: Secondary | ICD-10-CM | POA: Diagnosis not present

## 2018-04-25 DIAGNOSIS — M79642 Pain in left hand: Secondary | ICD-10-CM | POA: Diagnosis not present

## 2018-04-28 DIAGNOSIS — M79642 Pain in left hand: Secondary | ICD-10-CM | POA: Diagnosis not present

## 2018-05-02 DIAGNOSIS — M79642 Pain in left hand: Secondary | ICD-10-CM | POA: Diagnosis not present

## 2018-05-02 DIAGNOSIS — E210 Primary hyperparathyroidism: Secondary | ICD-10-CM | POA: Diagnosis not present

## 2018-05-04 ENCOUNTER — Other Ambulatory Visit: Payer: Self-pay | Admitting: Surgery

## 2018-05-04 DIAGNOSIS — E210 Primary hyperparathyroidism: Secondary | ICD-10-CM

## 2018-05-05 DIAGNOSIS — E210 Primary hyperparathyroidism: Secondary | ICD-10-CM | POA: Diagnosis not present

## 2018-05-06 DIAGNOSIS — M79642 Pain in left hand: Secondary | ICD-10-CM | POA: Diagnosis not present

## 2018-05-11 ENCOUNTER — Ambulatory Visit
Admission: RE | Admit: 2018-05-11 | Discharge: 2018-05-11 | Disposition: A | Discharge status: Home / Self Care | Payer: Medicare Other | Source: Ambulatory Visit | Attending: Surgery | Admitting: Surgery

## 2018-05-11 DIAGNOSIS — E041 Nontoxic single thyroid nodule: Secondary | ICD-10-CM | POA: Diagnosis not present

## 2018-05-11 DIAGNOSIS — E210 Primary hyperparathyroidism: Secondary | ICD-10-CM

## 2018-05-12 DIAGNOSIS — M79642 Pain in left hand: Secondary | ICD-10-CM | POA: Diagnosis not present

## 2018-05-18 ENCOUNTER — Ambulatory Visit: Payer: Self-pay | Admitting: Surgery

## 2018-05-19 DIAGNOSIS — M79642 Pain in left hand: Secondary | ICD-10-CM | POA: Diagnosis not present

## 2018-05-26 DIAGNOSIS — M79642 Pain in left hand: Secondary | ICD-10-CM | POA: Diagnosis not present

## 2018-05-31 DIAGNOSIS — M79642 Pain in left hand: Secondary | ICD-10-CM | POA: Diagnosis not present

## 2018-06-08 DIAGNOSIS — M79642 Pain in left hand: Secondary | ICD-10-CM | POA: Diagnosis not present

## 2018-06-10 NOTE — Progress Notes (Signed)
Echo 04-08-18 epic   EKG 04-01-18 [place]. Gwenlyn Found  04-01-18 Epic

## 2018-06-10 NOTE — Patient Instructions (Signed)
Delisa ROSEMARIA INABINET  06/10/2018   Your procedure is scheduled on: 06-16-18   Report to [facility] Main  Entrance    Report to admitting at 6:30AM    Call this number if you have problems the morning of surgery [phone]     Remember: Do not eat food or drink liquids :After Midnight.     Take these medicines the morning of surgery with A SIP OF WATER: AMLODIPINE, CARVEDILOL, ROSUVASTATIN                                 You may not have any metal on your body including hair pins and              piercings  Do not wear jewelry, make-up, lotions, powders or perfumes, deodorant             Do not wear nail polish.  Do not shave  48 hours prior to surgery.     Do not bring valuables to the hospital. [place].  Contacts, dentures or bridgework may not be worn into surgery.       Patients discharged the day of surgery will not be allowed to drive home.  Name and phone number of your driver:  Special Instructions: N/A              Please read over the following fact sheets you were given: _____________________________________________________________________             [facility] - Preparing for Surgery Before surgery, you can play an important role.  Because skin is not sterile, your skin needs to be as free of germs as possible.  You can reduce the number of germs on your skin by washing with CHG (chlorahexidine gluconate) soap before surgery.  CHG is an antiseptic cleaner which kills germs and bonds with the skin to continue killing germs even after washing. Please DO NOT use if you have an allergy to CHG or antibacterial soaps.  If your skin becomes reddened/irritated stop using the CHG and inform your nurse when you arrive at Short Stay. Do not shave (including legs and underarms) for at least 48 hours prior to the first CHG shower.  You may shave your face/neck. Please follow these instructions  carefully:  1.  Shower with CHG Soap the night before surgery and the  morning of Surgery.  2.  If you choose to wash your hair, wash your hair first as usual with your  normal  shampoo.  3.  After you shampoo, rinse your hair and body thoroughly to remove the  shampoo.                           4.  Use CHG as you would any other liquid soap.  You can apply chg directly  to the skin and wash                       Gently with a scrungie or clean washcloth.  5.  Apply the CHG Soap to your body ONLY FROM THE NECK DOWN.   Do not use on face/ open  Wound or open sores. Avoid contact with eyes, ears mouth and genitals (private parts).                       Wash face,  Genitals (private parts) with your normal soap.             6.  Wash thoroughly, paying special attention to the area where your surgery  will be performed.  7.  Thoroughly rinse your body with warm water from the neck down.  8.  DO NOT shower/wash with your normal soap after using and rinsing off  the CHG Soap.                9.  Pat yourself dry with a clean towel.            10.  Wear clean pajamas.            11.  Place clean sheets on your bed the night of your first shower and do not  sleep with pets. Day of Surgery : Do not apply any lotions/deodorants the morning of surgery.  Please wear clean clothes to the hospital/surgery center.  FAILURE TO FOLLOW THESE INSTRUCTIONS MAY RESULT IN THE CANCELLATION OF YOUR SURGERY PATIENT SIGNATURE_________________________________  NURSE SIGNATURE__________________________________  ________________________________________________________________________

## 2018-06-13 ENCOUNTER — Encounter (HOSPITAL_COMMUNITY)
Admission: RE | Admit: 2018-06-13 | Discharge: 2018-06-13 | Disposition: A | Discharge status: Home / Self Care | Payer: Medicare Other | Source: Ambulatory Visit | Attending: Surgery | Admitting: Surgery

## 2018-06-13 ENCOUNTER — Other Ambulatory Visit: Payer: Self-pay

## 2018-06-13 ENCOUNTER — Encounter (HOSPITAL_COMMUNITY): Payer: Self-pay

## 2018-06-13 DIAGNOSIS — Z79899 Other long term (current) drug therapy: Secondary | ICD-10-CM | POA: Diagnosis not present

## 2018-06-13 DIAGNOSIS — D351 Benign neoplasm of parathyroid gland: Secondary | ICD-10-CM | POA: Diagnosis not present

## 2018-06-13 DIAGNOSIS — Z7982 Long term (current) use of aspirin: Secondary | ICD-10-CM | POA: Diagnosis not present

## 2018-06-13 DIAGNOSIS — E7800 Pure hypercholesterolemia, unspecified: Secondary | ICD-10-CM | POA: Diagnosis not present

## 2018-06-13 DIAGNOSIS — E210 Primary hyperparathyroidism: Secondary | ICD-10-CM | POA: Diagnosis not present

## 2018-06-13 DIAGNOSIS — I10 Essential (primary) hypertension: Secondary | ICD-10-CM | POA: Diagnosis not present

## 2018-06-13 HISTORY — DX: Hyperparathyroidism, unspecified: E21.3

## 2018-06-13 LAB — CBC
HCT: 37.4 % (ref 36.0–46.0)
HEMOGLOBIN: 12.3 g/dL (ref 12.0–15.0)
MCH: 30.1 pg (ref 26.0–34.0)
MCHC: 32.9 g/dL (ref 30.0–36.0)
MCV: 91.7 fL (ref 78.0–100.0)
Platelets: 253 10*3/uL (ref 150–400)
RBC: 4.08 MIL/uL (ref 3.87–5.11)
RDW: 13.3 % (ref 11.5–15.5)
WBC: 5.2 10*3/uL (ref 4.0–10.5)

## 2018-06-13 LAB — BASIC METABOLIC PANEL
ANION GAP: 6 (ref 5–15)
BUN: 15 mg/dL (ref 8–23)
CALCIUM: 10.8 mg/dL — AB (ref 8.9–10.3)
CO2: 32 mmol/L (ref 22–32)
Chloride: 106 mmol/L (ref 98–111)
Creatinine, Ser: 0.73 mg/dL (ref 0.44–1.00)
GFR calc Af Amer: >60 mL/min (ref >60)
GFR calc non Af Amer: >60 mL/min (ref >60)
Glucose, Bld: 138 mg/dL — ABNORMAL HIGH (ref 70–99)
Potassium: 4.1 mmol/L (ref 3.5–5.1)
Sodium: 144 mmol/L (ref 135–145)

## 2018-06-14 DIAGNOSIS — M79642 Pain in left hand: Secondary | ICD-10-CM | POA: Diagnosis not present

## 2018-06-15 ENCOUNTER — Encounter (HOSPITAL_COMMUNITY): Payer: Self-pay | Admitting: Surgery

## 2018-06-15 DIAGNOSIS — E210 Primary hyperparathyroidism: Secondary | ICD-10-CM | POA: Diagnosis present

## 2018-06-15 NOTE — H&P (Signed)
General Surgery [facility]  Erica Meadows DOB: 1936/08/23 Married / Language: English / Race: Black or African American Female   History of Present Illness   The patient is a 82 year old female who presents with primary hyperparathyroidism.  CHIEF COMPLAINT: primary hyperparathyroidism  Patient is referred by Dr. Glendale Chard for surgical evaluation and management of suspected primary hyperparathyroidism. Patient was noted to have elevated serum calcium levels ranging from 10.9-11.2. Vitamin D level was normal at 68. Patient has had no complications from hypercalcemia. She denies fatigue. She denies nephrolithiasis. She denies osteoporosis. She has had no recent fractures. Patient underwent nuclear medicine parathyroid scan on April 05, 2018. This was a limited study but raised the possibility of a right inferior parathyroid adenoma. Patient has no history of prior head or neck surgery. There is no family history of parathyroid disease.   Past Surgical History Hysterectomy (not due to cancer) - Partial   Diagnostic Studies History  Colonoscopy  1-5 years ago Mammogram  within last year  Allergies No Known Allergies [05/02/2018]:  Medication History AmLODIPine Besylate (10MG  Tablet, Oral) Active. Carvedilol (12.5MG  Tablet, Oral) Active. Furosemide (20MG  Tablet, Oral) Active. Klor-Con M20 [facility] Tablet ER, Oral) Active. Losartan Potassium (100MG  Tablet, Oral) Active. Rosuvastatin Calcium (10MG  Tablet, Oral) Active. Aspirin (81MG  Tablet, Oral) Active. Medications Reconciled  Social History Caffeine use  Carbonated beverages.  Family History Cerebrovascular Accident  Father, Mother. Hypertension  Father, Mother. Kidney Disease  Sister.  Pregnancy / Birth History Age at menarche  25 years. Gravida  2 Maternal age  3-20 Para  2  Other Problems High blood pressure  Hypercholesterolemia   Review of Systems   General Not Present- Appetite Loss, Chills, Fatigue, Fever, Night Sweats, Weight Gain and Weight Loss. Skin Not Present- Change in Wart/Mole, Dryness, Hives, Jaundice, New Lesions, Non-Healing Wounds, Rash and Ulcer. HEENT Present- Wears glasses/contact lenses. Not Present- Earache, Hearing Loss, Hoarseness, Nose Bleed, Oral Ulcers, Ringing in the Ears, Seasonal Allergies, Sinus Pain, Sore Throat, Visual Disturbances and Yellow Eyes. Respiratory Not Present- Bloody sputum, Chronic Cough, Difficulty Breathing, Snoring and Wheezing. Breast Not Present- Breast Mass, Breast Pain, Nipple Discharge and Skin Changes. Cardiovascular Present- Swelling of Extremities. Not Present- Chest Pain, Difficulty Breathing Lying Down, Leg Cramps, Palpitations, Rapid Heart Rate and Shortness of Breath. Gastrointestinal Not Present- Abdominal Pain, Bloating, Bloody Stool, Change in Bowel Habits, Chronic diarrhea, Constipation, Difficulty Swallowing, Excessive gas, Gets full quickly at meals, Hemorrhoids, Indigestion, Nausea, Rectal Pain and Vomiting. Female Genitourinary Not Present- Frequency, Nocturia, Painful Urination, Pelvic Pain and Urgency. Musculoskeletal Present- Swelling of Extremities. Not Present- Back Pain, Joint Pain, Joint Stiffness, Muscle Pain and Muscle Weakness. Psychiatric Not Present- Anxiety, Bipolar, Change in Sleep Pattern, Depression, Fearful and Frequent crying.  Vitals  Weight: 134.8 lb Height: 61in Body Surface Area: 1.6 m Body Mass Index: 25.47 kg/m  Temp.: 97.49F(Temporal)  Pulse: 72 (Regular)  BP: 124/60 (Sitting, Right Arm, Standard)  Physical Exam  See vital signs recorded above  GENERAL APPEARANCE Development: normal Nutritional status: normal Gross deformities: none  SKIN Rash, lesions, ulcers: none Induration, erythema: none Nodules: none palpable  EYES Conjunctiva and lids: normal Pupils: equal and reactive Iris: normal bilaterally  EARS, NOSE,  MOUTH, THROAT External ears: no lesion or deformity External nose: no lesion or deformity Hearing: grossly normal Lips: no lesion or deformity Dentition: normal for age Oral mucosa: moist  NECK Symmetric: yes Trachea: midline Thyroid: no palpable nodules in the thyroid bed  CHEST Respiratory  effort: normal Retraction or accessory muscle use: no Breath sounds: normal bilaterally Rales, rhonchi, wheeze: none  CARDIOVASCULAR Auscultation: regular rhythm, normal rate Murmurs: none Pulses: carotid and radial pulse 2+ palpable Lower extremity edema: none Lower extremity varicosities: none  MUSCULOSKELETAL Station and gait: normal Digits and nails: no clubbing or cyanosis Muscle strength: grossly normal all extremities Range of motion: grossly normal all extremities Deformity: Compression garment on left hand with underlying healing surgical wound.  LYMPHATIC Cervical: none palpable Supraclavicular: none palpable  PSYCHIATRIC Oriented to person, place, and time: yes Mood and affect: normal for situation Judgment and insight: appropriate for situation    Assessment & Plan  PRIMARY HYPERPARATHYROIDISM (E21.0)  Pt Education - Pamphlet Given - The Parathyroid Surgery Book: discussed with patient and provided information.  Follow Up - Call [facility] office after tests / studies doneto discuss further plans  Patient is referred by her primary care physician, Dr. Glendale Chard, for surgical evaluation and management of suspected primary hyperparathyroidism. Patient is provided with written literature on parathyroid disease to review at home.  Patient has elevated calcium levels ranging from 10.9-11.2. Vitamin D level is normal. Nuclear medicine parathyroid scan shows possible right inferior parathyroid adenoma.  I would like to continue her evaluation with a repeat parathyroid hormone level and a 24-hour urine collection for calcium. We will then arrange for an ultrasound  examination of the neck to both evaluate the underlying thyroid gland as well as possibly identify the parathyroid adenoma and confirmed the impression of the nuclear medicine parathyroid scan. If these studies are not conclusive, we will consider a 4D CT scan of the neck.  Patient understands the plan and is willing to proceed with further diagnostic testing. We will discuss these results as soon as they are available.  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  Plan to proceed with minimally invasive surgery.  The risks and benefits of the procedure have been discussed at length with the patient.  The patient understands the proposed procedure, potential alternative treatments, and the course of recovery to be expected.  All of the patient's questions have been answered at this time.  The patient wishes to proceed with surgery.   [name] Gemma, [facility] [facility] Office: [phone]

## 2018-06-16 ENCOUNTER — Encounter (HOSPITAL_COMMUNITY)
Admission: RE | Disposition: A | Discharge status: Home / Self Care | Payer: Self-pay | Source: Ambulatory Visit | Attending: Surgery

## 2018-06-16 ENCOUNTER — Encounter (HOSPITAL_COMMUNITY): Payer: Self-pay

## 2018-06-16 ENCOUNTER — Other Ambulatory Visit: Payer: Self-pay

## 2018-06-16 ENCOUNTER — Ambulatory Visit (HOSPITAL_COMMUNITY)
Admission: RE | Admit: 2018-06-16 | Discharge: 2018-06-16 | Disposition: A | Discharge status: Home / Self Care | Payer: Medicare Other | Source: Ambulatory Visit | Attending: Surgery | Admitting: Surgery

## 2018-06-16 ENCOUNTER — Ambulatory Visit (HOSPITAL_COMMUNITY): Payer: Medicare Other | Admitting: Anesthesiology

## 2018-06-16 DIAGNOSIS — E210 Primary hyperparathyroidism: Secondary | ICD-10-CM | POA: Diagnosis not present

## 2018-06-16 DIAGNOSIS — E785 Hyperlipidemia, unspecified: Secondary | ICD-10-CM | POA: Diagnosis not present

## 2018-06-16 DIAGNOSIS — E7800 Pure hypercholesterolemia, unspecified: Secondary | ICD-10-CM | POA: Insufficient documentation

## 2018-06-16 DIAGNOSIS — D351 Benign neoplasm of parathyroid gland: Secondary | ICD-10-CM | POA: Insufficient documentation

## 2018-06-16 DIAGNOSIS — Z7982 Long term (current) use of aspirin: Secondary | ICD-10-CM | POA: Diagnosis not present

## 2018-06-16 DIAGNOSIS — I10 Essential (primary) hypertension: Secondary | ICD-10-CM | POA: Insufficient documentation

## 2018-06-16 DIAGNOSIS — Z79899 Other long term (current) drug therapy: Secondary | ICD-10-CM | POA: Diagnosis not present

## 2018-06-16 HISTORY — PX: PARATHYROIDECTOMY: SHX19

## 2018-06-16 SURGERY — PARATHYROIDECTOMY
Anesthesia: General | Site: Neck | Laterality: Right

## 2018-06-16 MED ORDER — CHLORHEXIDINE GLUCONATE CLOTH 2 % EX PADS: 6.0000 | MEDICATED_PAD | Freq: Once | CUTANEOUS | Status: DC

## 2018-06-16 MED ORDER — SUGAMMADEX SODIUM 200 MG/2ML IV SOLN
INTRAVENOUS | Status: AC
  Filled 2018-06-16: qty 2

## 2018-06-16 MED ORDER — ONDANSETRON HCL 4 MG/2ML IJ SOLN
INTRAMUSCULAR | Status: DC | PRN
  Administered 2018-06-16: 4 mg via INTRAVENOUS

## 2018-06-16 MED ORDER — OXYCODONE HCL 5 MG/5ML PO SOLN
5.0000 mg | Freq: Once | ORAL | Status: DC | PRN
  Filled 2018-06-16: qty 5

## 2018-06-16 MED ORDER — 0.9 % SODIUM CHLORIDE (POUR BTL) OPTIME
TOPICAL | Status: DC | PRN
  Administered 2018-06-16: 1000 mL

## 2018-06-16 MED ORDER — OXYCODONE HCL 5 MG PO TABS: 5.0000 mg | ORAL_TABLET | Freq: Once | ORAL | Status: DC | PRN

## 2018-06-16 MED ORDER — EPHEDRINE 5 MG/ML INJ
INTRAVENOUS | Status: AC
  Filled 2018-06-16: qty 10

## 2018-06-16 MED ORDER — LIDOCAINE 2% (20 MG/ML) 5 ML SYRINGE
INTRAMUSCULAR | Status: AC
  Filled 2018-06-16: qty 5

## 2018-06-16 MED ORDER — FENTANYL CITRATE (PF) 100 MCG/2ML IJ SOLN
INTRAMUSCULAR | Status: AC
  Filled 2018-06-16: qty 2

## 2018-06-16 MED ORDER — LIDOCAINE HCL 4 % MT SOLN
OROMUCOSAL | Status: DC | PRN
  Administered 2018-06-16: 5 mL via TOPICAL

## 2018-06-16 MED ORDER — TRAMADOL HCL 50 MG PO TABS: 50.0000 mg | ORAL_TABLET | Freq: Four times a day (QID) | ORAL | 0 refills | Status: DC | PRN

## 2018-06-16 MED ORDER — ROCURONIUM BROMIDE 10 MG/ML (PF) SYRINGE
PREFILLED_SYRINGE | INTRAVENOUS | Status: AC
  Filled 2018-06-16: qty 10

## 2018-06-16 MED ORDER — LIDOCAINE 2% (20 MG/ML) 5 ML SYRINGE
INTRAMUSCULAR | Status: DC | PRN
  Administered 2018-06-16: 40 mg via INTRAVENOUS

## 2018-06-16 MED ORDER — BUPIVACAINE HCL (PF) 0.25 % IJ SOLN
INTRAMUSCULAR | Status: AC
  Filled 2018-06-16: qty 30

## 2018-06-16 MED ORDER — CEFAZOLIN SODIUM-DEXTROSE 2-4 GM/100ML-% IV SOLN
2.0000 g | INTRAVENOUS | Status: AC
  Administered 2018-06-16: 2 g via INTRAVENOUS
  Filled 2018-06-16: qty 100

## 2018-06-16 MED ORDER — ONDANSETRON HCL 4 MG/2ML IJ SOLN
INTRAMUSCULAR | Status: AC
  Filled 2018-06-16: qty 2

## 2018-06-16 MED ORDER — FENTANYL CITRATE (PF) 100 MCG/2ML IJ SOLN
INTRAMUSCULAR | Status: DC | PRN
  Administered 2018-06-16: 100 ug via INTRAVENOUS

## 2018-06-16 MED ORDER — ROCURONIUM BROMIDE 10 MG/ML (PF) SYRINGE
PREFILLED_SYRINGE | INTRAVENOUS | Status: DC | PRN
  Administered 2018-06-16: 40 mg via INTRAVENOUS

## 2018-06-16 MED ORDER — BUPIVACAINE HCL 0.25 % IJ SOLN
INTRAMUSCULAR | Status: DC | PRN
  Administered 2018-06-16: 7 mL

## 2018-06-16 MED ORDER — FENTANYL CITRATE (PF) 100 MCG/2ML IJ SOLN: 25.0000 ug | INTRAMUSCULAR | Status: DC | PRN

## 2018-06-16 MED ORDER — EPHEDRINE SULFATE-NACL 50-0.9 MG/10ML-% IV SOSY
PREFILLED_SYRINGE | INTRAVENOUS | Status: DC | PRN
  Administered 2018-06-16 (×2): 5 mg via INTRAVENOUS

## 2018-06-16 MED ORDER — LACTATED RINGERS IV SOLN
INTRAVENOUS | Status: DC
  Administered 2018-06-16 (×2): via INTRAVENOUS

## 2018-06-16 MED ORDER — SUGAMMADEX SODIUM 200 MG/2ML IV SOLN
INTRAVENOUS | Status: DC | PRN
  Administered 2018-06-16: 140 mg via INTRAVENOUS

## 2018-06-16 MED ORDER — DEXAMETHASONE SODIUM PHOSPHATE 10 MG/ML IJ SOLN
INTRAMUSCULAR | Status: DC | PRN
  Administered 2018-06-16: 10 mg via INTRAVENOUS

## 2018-06-16 MED ORDER — PROPOFOL 10 MG/ML IV BOLUS
INTRAVENOUS | Status: AC
  Filled 2018-06-16: qty 20

## 2018-06-16 MED ORDER — PROPOFOL 10 MG/ML IV BOLUS
INTRAVENOUS | Status: DC | PRN
  Administered 2018-06-16: 100 mg via INTRAVENOUS

## 2018-06-16 MED ORDER — DEXAMETHASONE SODIUM PHOSPHATE 10 MG/ML IJ SOLN
INTRAMUSCULAR | Status: AC
  Filled 2018-06-16: qty 1

## 2018-06-16 SURGICAL SUPPLY — 36 items
ATTRACTOMAT 16X20 MAGNETIC DRP (DRAPES) ×3 IMPLANT
BLADE SURG 15 STRL LF DISP TIS (BLADE) ×1 IMPLANT
BLADE SURG 15 STRL SS (BLADE) ×2
CHLORAPREP W/TINT 26ML (MISCELLANEOUS) ×3 IMPLANT
CLIP VESOCCLUDE MED 6/CT (CLIP) ×6 IMPLANT
CLIP VESOCCLUDE SM WIDE 6/CT (CLIP) ×6 IMPLANT
CLOSURE WOUND 1/2 X4 (GAUZE/BANDAGES/DRESSINGS)
COVER SURGICAL LIGHT HANDLE (MISCELLANEOUS) ×3 IMPLANT
DERMABOND ADVANCED (GAUZE/BANDAGES/DRESSINGS)
DERMABOND ADVANCED .7 DNX12 (GAUZE/BANDAGES/DRESSINGS) IMPLANT
DRAPE LAPAROTOMY T 98X78 PEDS (DRAPES) ×3 IMPLANT
ELECT PENCIL ROCKER SW 15FT (MISCELLANEOUS) ×3 IMPLANT
ELECT REM PT RETURN 15FT ADLT (MISCELLANEOUS) ×3 IMPLANT
GAUZE 4X4 16PLY RFD (DISPOSABLE) ×3 IMPLANT
GAUZE SPONGE 4X4 12PLY STRL (GAUZE/BANDAGES/DRESSINGS) ×3 IMPLANT
GLOVE SURG ORTHO 8.0 STRL STRW (GLOVE) ×3 IMPLANT
GOWN STRL REUS W/TWL XL LVL3 (GOWN DISPOSABLE) ×9 IMPLANT
HEMOSTAT SURGICEL 2X4 FIBR (HEMOSTASIS) IMPLANT
ILLUMINATOR WAVEGUIDE N/F (MISCELLANEOUS) IMPLANT
KIT BASIN OR (CUSTOM PROCEDURE TRAY) ×3 IMPLANT
LIGHT WAVEGUIDE WIDE FLAT (MISCELLANEOUS) IMPLANT
NEEDLE HYPO 25X1 1.5 SAFETY (NEEDLE) ×3 IMPLANT
PACK BASIC VI WITH GOWN DISP (CUSTOM PROCEDURE TRAY) ×3 IMPLANT
POWDER SURGICEL 3.0 GRAM (HEMOSTASIS) IMPLANT
STRIP CLOSURE SKIN 1/2X4 (GAUZE/BANDAGES/DRESSINGS) IMPLANT
SUT MNCRL AB 4-0 PS2 18 (SUTURE) ×3 IMPLANT
SUT SILK 2 0 (SUTURE)
SUT SILK 2-0 18XBRD TIE 12 (SUTURE) IMPLANT
SUT VIC AB 3-0 SH 18 (SUTURE) ×3 IMPLANT
SYR BULB IRRIGATION 50ML (SYRINGE) ×3 IMPLANT
SYR CONTROL 10ML LL (SYRINGE) ×3 IMPLANT
TAPE CLOTH SURG 4X10 WHT LF (GAUZE/BANDAGES/DRESSINGS) ×3 IMPLANT
TAPE STRIPS DRAPE STRL (GAUZE/BANDAGES/DRESSINGS) ×3 IMPLANT
TOWEL OR 17X26 10 PK STRL BLUE (TOWEL DISPOSABLE) ×3 IMPLANT
TOWEL OR NON WOVEN STRL DISP B (DISPOSABLE) ×3 IMPLANT
YANKAUER SUCT BULB TIP 10FT TU (MISCELLANEOUS) ×3 IMPLANT

## 2018-06-16 NOTE — Interval H&P Note (Signed)
History and Physical Interval Note:  06/16/2018 8:24 AM  Erica Meadows  has presented today for surgery, with the diagnosis of primary hyperparathyroidism.  The various methods of treatment have been discussed with the patient and family. After consideration of risks, benefits and other options for treatment, the patient has consented to    Procedure(s): RIGHT INFERIOR PARATHYROIDECTOMY (Right) as a surgical intervention .    The patient's history has been reviewed, patient examined, no change in status, stable for surgery.  I have reviewed the patient's chart and labs.  Questions were answered to the patient's satisfaction.    [name] Gemma, [facility] Office: [place]

## 2018-06-16 NOTE — Anesthesia Preprocedure Evaluation (Signed)
Anesthesia Evaluation  Patient identified by MRN, date of birth, ID band Patient awake    History of Anesthesia Complications Negative for: history of anesthetic complications  Airway Mallampati: III  TM Distance: >3 FB Neck ROM: Full    Dental  (+) Teeth Intact   Pulmonary neg pulmonary ROS,    breath sounds clear to auscultation       Cardiovascular hypertension, Pt. on medications and Pt. on home beta blockers (-) angina(-) Past MI and (-) CHF  Rhythm:Regular     Neuro/Psych negative neurological ROS  negative psych ROS   GI/Hepatic negative GI ROS, Neg liver ROS,   Endo/Other  primary hyperparathyroidism  Renal/GU negative Renal ROS     Musculoskeletal negative musculoskeletal ROS (+)   Abdominal   Peds  Hematology negative hematology ROS (+)   Anesthesia Other Findings TTE 7/19: Left ventricle: The cavity size was normal. Wall thickness was   increased in a pattern of moderate LVH. Systolic function was   normal. The estimated ejection fraction was in the range of 60%   to 65%. Wall motion was normal; there were no regional wall   motion abnormalities. Left ventricular diastolic function   parameters were normal. - Aortic valve: There was trivial regurgitation. - Atrial septum: No defect or patent foramen ovale was identified.   Reproductive/Obstetrics                             Anesthesia Physical Anesthesia Plan  ASA: II  Anesthesia Plan: General   Post-op Pain Management:    Induction: Intravenous  PONV Risk Score and Plan: 3 and Ondansetron and Dexamethasone  Airway Management Planned: Oral ETT  Additional Equipment: None  Intra-op Plan:   Post-operative Plan: Extubation in OR  Informed Consent: I have reviewed the patients History and Physical, chart, labs and discussed the procedure including the risks, benefits and alternatives for the proposed anesthesia  with the patient or authorized representative who has indicated his/her understanding and acceptance.   Dental advisory given  Plan Discussed with: CRNA and Surgeon  Anesthesia Plan Comments:         Anesthesia Quick Evaluation

## 2018-06-16 NOTE — Anesthesia Procedure Notes (Signed)
Procedure Name: Intubation Date/Time: 06/16/2018 8:47 AM Performed by: Sharlette Dense, CRNA Patient Re-evaluated:Patient Re-evaluated prior to induction Oxygen Delivery Method: Circle system utilized Preoxygenation: Pre-oxygenation with 100% oxygen Induction Type: IV induction Ventilation: Mask ventilation without difficulty and Oral airway inserted - appropriate to patient size Laryngoscope Size: Sabra Heck and 2 Grade View: Grade I Tube type: Reinforced Tube size: 7.0 mm Number of attempts: 1 Placement Confirmation: ETT inserted through vocal cords under direct vision,  positive ETCO2 and breath sounds checked- equal and bilateral Secured at: 21 cm Tube secured with: Tape Dental Injury: Teeth and Oropharynx as per pre-operative assessment

## 2018-06-16 NOTE — Transfer of Care (Signed)
Immediate Anesthesia Transfer of Care Note  Patient: Erica Meadows  Procedure(s) Performed: RIGHT INFERIOR PARATHYROIDECTOMY (Right Neck)  Patient Location: PACU  Anesthesia Type:General  Level of Consciousness: awake and alert   Airway & Oxygen Therapy: Patient Spontanous Breathing and Patient connected to face mask oxygen  Post-op Assessment: Report given to RN and Post -op Vital signs reviewed and stable  Post vital signs: Reviewed and stable  Last Vitals:  Vitals Value Taken Time  BP 162/71 06/16/2018  9:45 AM  Temp    Pulse 51 06/16/2018  9:46 AM  Resp    SpO2 100 % 06/16/2018  9:46 AM  Vitals shown include unvalidated device data.  Last Pain:  Vitals:   06/16/18 0724  TempSrc:   PainSc: 0-No pain         Complications: No apparent anesthesia complications

## 2018-06-16 NOTE — Discharge Instructions (Signed)
[facility]  THYROID & PARATHYROID SURGERY:  POST-OP INSTRUCTIONS  Always review your discharge instruction sheet from the facility where your surgery was performed.  A prescription for pain medication may be given to you upon discharge.  Take your pain medication as prescribed.  If narcotic pain medicine is not needed, then you may take acetaminophen (Tylenol) or ibuprofen (Advil) as needed.  Take your usually prescribed medications unless otherwise directed.  If you need a refill on your pain medication, please contact our office during regular business hours.  Prescriptions cannot be processed by our office after 5 pm or on weekends.  Start with a light diet upon arrival home, such as soup and crackers or toast.  Be sure to drink plenty of fluids daily.  Resume your normal diet the day after surgery.  Most patients will experience some swelling and bruising on the chest and neck area.  Ice packs will help.  Swelling and bruising can take several days to resolve.   It is common to experience some constipation after surgery.  Increasing fluid intake and taking a stool softener (Colace) will usually help or prevent this problem.  A mild laxative (Milk of Magnesia or Miralax) should be taken according to package directions if there has been no bowel movement after 48 hours.  You have steri-strips and a gauze dressing over your incision.  You may remove the gauze bandage on the second day after surgery, and you may shower at that time.  Leave your steri-strips (small skin tapes) in place directly over the incision.  These strips should remain on the skin for 5-7 days and then be removed.  You may get them wet in the shower and pat them dry.  You may resume regular (light) daily activities beginning the next day (such as daily self-care, walking, climbing stairs) gradually increasing activities as tolerated.  You may have sexual intercourse when it is comfortable.  Refrain from  any heavy lifting or straining until approved by your doctor.  You may drive when you no longer are taking prescription pain medication, you can comfortably wear a seatbelt, and you can safely maneuver your car and apply brakes.  You should see your doctor in the office for a follow-up appointment approximately three weeks after your surgery.  Make sure that you call for this appointment within a day or two after you arrive home to insure a convenient appointment time.  WHEN TO CALL YOUR DOCTOR: -- Fever greater than 101.5 -- Inability to urinate -- Nausea and/or vomiting - persistent -- Extreme swelling or bruising -- Continued bleeding from incision -- Increased pain, redness, or drainage from the incision -- Difficulty swallowing or breathing -- Muscle cramping or spasms -- Numbness or tingling in hands or around lips  The clinic staff is available to answer your questions during regular business hours.  Please don't hesitate to call and ask to speak to one of the nurses if you have concerns.  Shawna Kiener, MD [facility] Office: [phone] 

## 2018-06-16 NOTE — Op Note (Signed)
OPERATIVE REPORT - PARATHYROIDECTOMY  Preoperative diagnosis: Primary hyperparathyroidism  Postop diagnosis: Same  Procedure: Right inferior minimally invasive parathyroidectomy  Surgeon:  [name] Gemma, MD  Anesthesia: General endotracheal  Estimated blood loss: Minimal  Preparation: ChloraPrep  Indications: Patient is referred by Dr. Glendale Chard for surgical evaluation and management of suspected primary hyperparathyroidism. Patient was noted to have elevated serum calcium levels ranging from 10.9-11.2. Vitamin D level was normal at 68. Patient has had no complications from hypercalcemia. She denies fatigue. She denies nephrolithiasis. She denies osteoporosis. She has had no recent fractures. Patient underwent nuclear medicine parathyroid scan on April 05, 2018. This was a limited study but raised the possibility of a right inferior parathyroid adenoma. USN confirmed a right inferior adenoma.  Now for minimally invasive surgery.  Procedure: The patient was prepared in the pre-operative holding area. The patient was brought to the operating room and placed in a supine position on the operating room table. Following administration of general anesthesia, the patient was positioned and then prepped and draped in the usual strict aseptic fashion. After ascertaining that an adequate level of anesthesia been achieved, a neck incision was made with a #15 blade. Dissection was carried through subcutaneous tissues and platysma. Hemostasis was obtained with the electrocautery. Skin flaps were developed circumferentially and a Weitlander retractor was placed for exposure.  Strap muscles were incised in the midline. Strap muscles were reflected lateralley exposing the thyroid lobe. With gentle blunt dissection the thyroid lobe was mobilized.  Dissection was carried through adipose tissue and an enlarged parathyroid gland was identified. It was gently mobilized. Vascular structures were divided  between small and medium ligaclips. Care was taken to avoid the recurrent laryngeal nerve and the esophagus. The parathyroid gland was completely excised. It was submitted to pathology where frozen section confirmed parathyroid tissue consistent with adenoma.  Neck was irrigated with warm saline and good hemostasis was noted. Strap muscles were reapproximated in the midline with interrupted 3-0 Vicryl sutures. Platysma was closed with interrupted 3-0 Vicryl sutures. Skin was closed with a running 4-0 Monocryl subcuticular suture. Marcaine was infiltrated circumferentially. Wound was washed and dried and Steristrips were applied. Patient was awakened from anesthesia and brought to the recovery room. The patient tolerated the procedure well.   [name] Gemma, MD [facility] Office: [phone]

## 2018-06-17 ENCOUNTER — Encounter (HOSPITAL_COMMUNITY): Payer: Self-pay | Admitting: Surgery

## 2018-06-17 NOTE — Anesthesia Postprocedure Evaluation (Signed)
Anesthesia Post Note  Patient: [name]  Procedure(s) Performed: RIGHT INFERIOR PARATHYROIDECTOMY (Right Neck)     Patient location during evaluation: PACU Anesthesia Type: General Level of consciousness: awake and alert Pain management: pain level controlled Vital Signs Assessment: post-procedure vital signs reviewed and stable Respiratory status: spontaneous breathing, nonlabored ventilation, respiratory function stable and patient connected to nasal cannula oxygen Cardiovascular status: blood pressure returned to baseline and stable Postop Assessment: no apparent nausea or vomiting Anesthetic complications: no    Last Vitals:  Vitals:   06/16/18 1030 06/16/18 1044  BP: (!) 147/60 (!) 150/70  Pulse:  (!) 58  Resp:  14  Temp: 36.6 C 36.7 C  SpO2:  98%    Last Pain:  Vitals:   06/16/18 1044  TempSrc:   PainSc: 0-No pain                 Sohana Austell

## 2018-06-21 DIAGNOSIS — M79642 Pain in left hand: Secondary | ICD-10-CM | POA: Diagnosis not present

## 2018-06-29 DIAGNOSIS — M79642 Pain in left hand: Secondary | ICD-10-CM | POA: Diagnosis not present

## 2018-07-04 DIAGNOSIS — E210 Primary hyperparathyroidism: Secondary | ICD-10-CM | POA: Diagnosis not present

## 2018-07-04 DIAGNOSIS — M79642 Pain in left hand: Secondary | ICD-10-CM | POA: Diagnosis not present

## 2018-07-04 DIAGNOSIS — S61412D Laceration without foreign body of left hand, subsequent encounter: Secondary | ICD-10-CM | POA: Diagnosis not present

## 2018-07-21 DIAGNOSIS — Z1231 Encounter for screening mammogram for malignant neoplasm of breast: Secondary | ICD-10-CM | POA: Diagnosis not present

## 2018-07-21 LAB — HM MAMMOGRAPHY: HM Mammogram: NORMAL (ref 0–4)

## 2018-08-11 ENCOUNTER — Ambulatory Visit (INDEPENDENT_AMBULATORY_CARE_PROVIDER_SITE_OTHER): Payer: Medicare Other

## 2018-08-11 ENCOUNTER — Other Ambulatory Visit (HOSPITAL_COMMUNITY)
Admission: RE | Admit: 2018-08-11 | Discharge: 2018-08-11 | Disposition: A | Discharge status: Home / Self Care | Payer: Medicare Other | Source: Ambulatory Visit | Attending: Nurse Practitioner | Admitting: Nurse Practitioner

## 2018-08-11 ENCOUNTER — Ambulatory Visit (INDEPENDENT_AMBULATORY_CARE_PROVIDER_SITE_OTHER): Payer: Medicare Other | Admitting: Internal Medicine

## 2018-08-11 ENCOUNTER — Encounter: Payer: Self-pay | Admitting: Internal Medicine

## 2018-08-11 VITALS — BP 142/62 | HR 67 | Temp 98.3°F | Ht 61.0 in | Wt 138.2 lb

## 2018-08-11 VITALS — BP 142/62 | HR 67 | Temp 98.3°F | Ht 61.0 in | Wt 138.0 lb

## 2018-08-11 DIAGNOSIS — I10 Essential (primary) hypertension: Secondary | ICD-10-CM

## 2018-08-11 DIAGNOSIS — Z0000 Encounter for general adult medical examination without abnormal findings: Secondary | ICD-10-CM | POA: Diagnosis not present

## 2018-08-11 DIAGNOSIS — N898 Other specified noninflammatory disorders of vagina: Secondary | ICD-10-CM | POA: Insufficient documentation

## 2018-08-11 DIAGNOSIS — R39198 Other difficulties with micturition: Secondary | ICD-10-CM | POA: Diagnosis not present

## 2018-08-11 DIAGNOSIS — Z1272 Encounter for screening for malignant neoplasm of vagina: Secondary | ICD-10-CM | POA: Insufficient documentation

## 2018-08-11 DIAGNOSIS — R6889 Other general symptoms and signs: Secondary | ICD-10-CM | POA: Diagnosis not present

## 2018-08-11 LAB — POCT UA - MICROALBUMIN
Albumin/Creatinine Ratio, Urine, POC: 30
Creatinine, POC: 200 mg/dL
MICROALBUMIN (UR) POC: 10 mg/L

## 2018-08-11 LAB — POCT URINALYSIS DIPSTICK
Bilirubin, UA: NEGATIVE
Blood, UA: NEGATIVE
Glucose, UA: NEGATIVE
Ketones, UA: NEGATIVE
LEUKOCYTES UA: NEGATIVE
NITRITE UA: NEGATIVE
PROTEIN UA: NEGATIVE
Spec Grav, UA: 1.015 (ref 1.010–1.025)
Urobilinogen, UA: 0.2 E.U./dL
pH, UA: 7 (ref 5.0–8.0)

## 2018-08-11 NOTE — Patient Instructions (Signed)
[name] , Thank you for taking time to come for your Medicare Wellness Visit. I appreciate your ongoing commitment to your health goals. Please review the following plan we discussed and let me know if I can assist you in the future.   Screening recommendations/referrals: Colonoscopy: not required Mammogram: up to date Bone Density: up to date Recommended yearly ophthalmology/optometry visit for glaucoma screening and checkup Recommended yearly dental visit for hygiene and checkup  Vaccinations: Influenza vaccine: up to date per patient belief Pneumococcal vaccine: decline Tdap vaccine: 02/2018 Shingles vaccine: dec    Advanced directives: Advance directive discussed with you today. Even though you declined this today please call our office should you change your mind and we can give you the proper paperwork for you to fill out.   Conditions/risks identified: Overweight: patient does not currently participate in structured exercise. She is working on increasing her water intake  Next appointment: 11/15/2018 at 11:00a   Preventive Care 65 Years and Older, Female Preventive care refers to lifestyle choices and visits with your health care provider that can promote health and wellness. What does preventive care include?  A yearly physical exam. This is also called an annual well check.  Dental exams once or twice a year.  Routine eye exams. Ask your health care provider how often you should have your eyes checked.  Personal lifestyle choices, including:  Daily care of your teeth and gums.  Regular physical activity.  Eating a healthy diet.  Avoiding tobacco and drug use.  Limiting alcohol use.  Practicing safe sex.  Taking low-dose aspirin every day.  Taking vitamin and mineral supplements as recommended by your health care provider. What happens during an annual well check? The services and screenings done by your health care provider during your annual well check  will depend on your age, overall health, lifestyle risk factors, and family history of disease. Counseling  Your health care provider may ask you questions about your:  Alcohol use.  Tobacco use.  Drug use.  Emotional well-being.  Home and relationship well-being.  Sexual activity.  Eating habits.  History of falls.  Memory and ability to understand (cognition).  Work and work Statistician.  Reproductive health. Screening  You may have the following tests or measurements:  Height, weight, and BMI.  Blood pressure.  Lipid and cholesterol levels. These may be checked every 5 years, or more frequently if you are over 16 years old.  Skin check.  Lung cancer screening. You may have this screening every year starting at age 63 if you have a 30-pack-year history of smoking and currently smoke or have quit within the past 15 years.  Fecal occult blood test (FOBT) of the stool. You may have this test every year starting at age 72.  Flexible sigmoidoscopy or colonoscopy. You may have a sigmoidoscopy every 5 years or a colonoscopy every 10 years starting at age 50.  Hepatitis C blood test.  Hepatitis B blood test.  Sexually transmitted disease (STD) testing.  Diabetes screening. This is done by checking your blood sugar (glucose) after you have not eaten for a while (fasting). You may have this done every 1-3 years.  Bone density scan. This is done to screen for osteoporosis. You may have this done starting at age 37.  Mammogram. This may be done every 1-2 years. Talk to your health care provider about how often you should have regular mammograms. Talk with your health care provider about your test results, treatment options, and  if necessary, the need for more tests. Vaccines  Your health care provider may recommend certain vaccines, such as:  Influenza vaccine. This is recommended every year.  Tetanus, diphtheria, and acellular pertussis (Tdap, Td) vaccine. You may  need a Td booster every 10 years.  Zoster vaccine. You may need this after age 40.  Pneumococcal 13-valent conjugate (PCV13) vaccine. One dose is recommended after age 70.  Pneumococcal polysaccharide (PPSV23) vaccine. One dose is recommended after age 46. Talk to your health care provider about which screenings and vaccines you need and how often you need them. This information is not intended to replace advice given to you by your health care provider. Make sure you discuss any questions you have with your health care provider. Document Released: 10/18/2015 Document Revised: 06/10/2016 Document Reviewed: 07/23/2015 Elsevier Interactive Patient Education  2017 [place] Prevention in the Home Falls can cause injuries. They can happen to people of all ages. There are many things you can do to make your home safe and to help prevent falls. What can I do on the outside of my home?  Regularly fix the edges of walkways and driveways and fix any cracks.  Remove anything that might make you trip as you walk through a door, such as a raised step or threshold.  Trim any bushes or trees on the path to your home.  Use bright outdoor lighting.  Clear any walking paths of anything that might make someone trip, such as rocks or tools.  Regularly check to see if handrails are loose or broken. Make sure that both sides of any steps have handrails.  Any raised decks and porches should have guardrails on the edges.  Have any leaves, snow, or ice cleared regularly.  Use sand or salt on walking paths during winter.  Clean up any spills in your garage right away. This includes oil or grease spills. What can I do in the bathroom?  Use night lights.  Install grab bars by the toilet and in the tub and shower. Do not use towel bars as grab bars.  Use non-skid mats or decals in the tub or shower.  If you need to sit down in the shower, use a plastic, non-slip stool.  Keep the floor  dry. Clean up any water that spills on the floor as soon as it happens.  Remove soap buildup in the tub or shower regularly.  Attach bath mats securely with double-sided non-slip rug tape.  Do not have throw rugs and other things on the floor that can make you trip. What can I do in the bedroom?  Use night lights.  Make sure that you have a light by your bed that is easy to reach.  Do not use any sheets or blankets that are too big for your bed. They should not hang down onto the floor.  Have a firm chair that has side arms. You can use this for support while you get dressed.  Do not have throw rugs and other things on the floor that can make you trip. What can I do in the kitchen?  Clean up any spills right away.  Avoid walking on wet floors.  Keep items that you use a lot in easy-to-reach places.  If you need to reach something above you, use a strong step stool that has a grab bar.  Keep electrical cords out of the way.  Do not use floor polish or wax that makes floors slippery. If you  must use wax, use non-skid floor wax.  Do not have throw rugs and other things on the floor that can make you trip. What can I do with my stairs?  Do not leave any items on the stairs.  Make sure that there are handrails on both sides of the stairs and use them. Fix handrails that are broken or loose. Make sure that handrails are as long as the stairways.  Check any carpeting to make sure that it is firmly attached to the stairs. Fix any carpet that is loose or worn.  Avoid having throw rugs at the top or bottom of the stairs. If you do have throw rugs, attach them to the floor with carpet tape.  Make sure that you have a light switch at the top of the stairs and the bottom of the stairs. If you do not have them, ask someone to add them for you. What else can I do to help prevent falls?  Wear shoes that:  Do not have high heels.  Have rubber bottoms.  Are comfortable and fit you  well.  Are closed at the toe. Do not wear sandals.  If you use a stepladder:  Make sure that it is fully opened. Do not climb a closed stepladder.  Make sure that both sides of the stepladder are locked into place.  Ask someone to hold it for you, if possible.  Clearly mark and make sure that you can see:  Any grab bars or handrails.  First and last steps.  Where the edge of each step is.  Use tools that help you move around (mobility aids) if they are needed. These include:  Canes.  Walkers.  Scooters.  Crutches.  Turn on the lights when you go into a dark area. Replace any light bulbs as soon as they burn out.  Set up your furniture so you have a clear path. Avoid moving your furniture around.  If any of your floors are uneven, fix them.  If there are any pets around you, be aware of where they are.  Review your medicines with your doctor. Some medicines can make you feel dizzy. This can increase your chance of falling. Ask your doctor what other things that you can do to help prevent falls. This information is not intended to replace advice given to you by your health care provider. Make sure you discuss any questions you have with your health care provider. Document Released: 07/18/2009 Document Revised: 02/27/2016 Document Reviewed: 10/26/2014 Elsevier Interactive Patient Education  2017 [employer].

## 2018-08-11 NOTE — Progress Notes (Signed)
Subjective:     Patient ID: Erica Meadows , female    DOB: 1936-03-06 , 82 y.o.   MRN: [id number]   Chief Complaint  Patient presents with  . Hypertension    HPI Here for FU HTN Her BP normally rungs 130's/ 60's. She has noticed her urine stream sprays to her buttocks for the past few months, and since last week has noticed a vaginal discharge with slight yellow tinge, but no odor or itching present. She is not sexually active with her husband presently. And denies having sex with anyone else but her husband.   Past Medical History:  Diagnosis Date  . High cholesterol   . Hyperparathyroidism ([place])   . Hypertension    under control; has been on med. x 15-20 yr.  . Seasonal allergies   . Trigger thumb of left hand 12/2012     Family History  Problem Relation Age of Onset  . Hypertension Mother   . Stroke Mother   . Stroke Father   . Hypertension Father      Current Outpatient Medications:  .  amLODipine (NORVASC) 10 MG tablet, Take 10 mg by mouth daily., Disp: , Rfl:  .  aspirin 81 MG tablet, Take 81 mg by mouth every evening. , Disp: , Rfl:  .  carvedilol (COREG) 12.5 MG tablet, Take 12.5 mg by mouth 2 (two) times daily., Disp: , Rfl: 5 .  cholecalciferol (VITAMIN D) 1000 units tablet, Take 5,000 Units by mouth daily. , Disp: , Rfl:  .  furosemide (LASIX) 20 MG tablet, Take 20 mg by mouth daily. , Disp: , Rfl:  .  KLOR-CON M20 20 MEQ tablet, Take 20 mEq by mouth daily. , Disp: , Rfl: 11 .  losartan-hydrochlorothiazide (HYZAAR) 100-12.5 MG tablet, Take 1 tablet by mouth daily., Disp: , Rfl: 1 .  rosuvastatin (CRESTOR) 10 MG tablet, Take 10 mg by mouth daily., Disp: , Rfl:  .  traMADol (ULTRAM) 50 MG tablet, Take 1-2 tablets (50-100 mg total) by mouth every 6 (six) hours as needed. (Patient not taking: Reported on 08/11/2018), Disp: 15 tablet, Rfl: 0   No Known Allergies   Review of Systems  Constitutional: Negative for diaphoresis.  HENT: Negative for tinnitus.    Eyes: Negative for visual disturbance.  Respiratory: Negative for chest tightness and shortness of breath.   Cardiovascular: Positive for palpitations. Negative for chest pain and leg swelling.       Mild on both, L>R which is not new for her. Elevation helps.   Gastrointestinal: Negative for constipation, diarrhea and nausea.  Endocrine: Positive for cold intolerance.  Genitourinary: Positive for vaginal discharge. Negative for dysuria, frequency and urgency.       Has mild stress incontinence on occasion. Vaginal discharge x 1 week.  Skin: Negative for rash.  Neurological: Negative for weakness and headaches.    Objective:  Physical Exam    Today's Vitals   08/11/18 1120  BP: (!) 142/62  Pulse: 67  Temp: 98.3 F (36.8 C)  TempSrc: Oral  SpO2: 98%  Weight: 138 lb (62.6 kg)  Height: 5\' 1"  (1.549 m)   Body mass index is 26.07 kg/m. Constitutional: She is oriented to person, place, and time. She appears well-developed and well-nourished. No distress.  HENT:  Head: Normocephalic and atraumatic.  Right Ear: External ear normal.  Left Ear: External ear normal.  Nose: Nose normal.  Eyes: Conjunctivae are normal. Right eye exhibits no discharge. Left eye exhibits no discharge. No scleral  icterus.  Neck: Neck supple. No thyromegaly present.  No carotid bruits bilaterally  Cardiovascular: Normal rate and regular rhythm.  No murmur heard. Pulmonary/Chest: Effort normal and breath sounds normal. No respiratory distress.  Musculoskeletal: Normal range of motion. She exhibits no edema.  Lymphadenopathy:    She has no cervical adenopathy.  Neurological: She is alert and oriented to person, place, and time.  Skin: Skin is warm and dry. Capillary refill takes less than 2 seconds. No rash noted. She is not diaphoretic.  Psychiatric: She has a normal mood and affect. Her behavior is normal. Judgment and thought content normal.  Nursing note reviewed. Pelvic- external genitalia has some  atrophic changes, there is erythematous tissue in the urethra. Vaginal cuff is pink, and has light brown discharge which appeared when I opened the speculum more. I did not see any lesions or skin tears. I used the spatula to obtain cells from these areas for pap Assessment And Plan:     1. Essential hypertension- not well controled. Will continue to monitor this and for now will stay on same medications.  - CMP14 + Anion Gap - CBC no Diff - Lipid Profile - POCT Urinalysis Dipstick (81002) - POCT UA - Microalbumin  2. Urine stream spraying- acute, maybe she has a urethra polyp causing this. She was sent to: - Ambulatory referral to Urology  3. Cold intolerance- chronic - TSH - T3, free - T4, Free  4. Vaginal discharge- acute - NuSwab Vaginitis (VG) - Anaerobic and Aerobic Culture We will call her with results and if positive for treatment.  5. Screening for vaginal cancer- screen.  - Pap Lb, rfx HPV ASCU   Kennen Stammer RODRIGUEZ-SOUTH[place], PA-C

## 2018-08-11 NOTE — Patient Instructions (Signed)
I have sent a referral to urology to check your urethra and stream abnormality  I am sending swabs to check for any kind of infection. We will call you

## 2018-08-11 NOTE — Progress Notes (Signed)
Subjective:   Erica Meadows is a 82 y.o. female who presents for Medicare Annual (Subsequent) preventive examination.  Review of Systems:  n/a Cardiac Risk Factors include: advanced age (>52men, >65 women);sedentary lifestyle     Objective:     Vitals: BP (!) 142/62 (BP Location: Left Arm)   Pulse 67   Temp 98.3 F (36.8 C)   Ht 5\' 1"  (1.549 m)   Wt 138 lb 3.2 oz (62.7 kg)   SpO2 98%   BMI 26.11 kg/m   Body mass index is 26.11 kg/m.  Advanced Directives 08/11/2018 06/16/2018 06/13/2018 12/08/2012  Does Patient Have a Medical Advance Directive? No No No Patient does not have advance directive;Patient would like information  Would patient like information on creating a medical advance directive? No - Patient declined No - Patient declined Yes (MAU/Ambulatory/Procedural Areas - Information given) Advance directive packet given    Tobacco Social History   Tobacco Use  Smoking Status Never Smoker  Smokeless Tobacco Never Used     Counseling given: Not Answered   Clinical Intake:  Pre-visit preparation completed: Yes  Pain : No/denies pain     Nutritional Status: BMI 25 -29 Overweight Nutritional Risks: None Diabetes: No  How often do you need to have someone help you when you read instructions, pamphlets, or other written materials from your doctor or pharmacy?: 1 - Never What is the last grade level you completed in school?: some college  Interpreter Needed?: No  Information entered by :: NAllen LPN  Past Medical History:  Diagnosis Date  . High cholesterol   . Hyperparathyroidism ([place])   . Hypertension    under control; has been on med. x 15-20 yr.  . Seasonal allergies   . Trigger thumb of left hand 12/2012   Past Surgical History:  Procedure Laterality Date  . ABDOMINAL HYSTERECTOMY  1975   partial  . HAND  SURGERY  Left 03/2018   ANTERIOR  HAND ;  CUT INTO TENDON ON HAND WHILE CHOPPING CABBAGE ;     . PARATHYROIDECTOMY Right 06/16/2018   Procedure: RIGHT INFERIOR PARATHYROIDECTOMY;  Surgeon: [name] Gemma, MD;  Location: [facility];  Service: General;  Laterality: Right;  . TRIGGER FINGER RELEASE Right   . TRIGGER FINGER RELEASE Left 12/12/2012   Procedure: RELEASE TRIGGER FINGER/A-1 PULLEY LEFT THUMB;  Surgeon: Wynonia Sours, MD;  Location: [facility];  Service: Orthopedics;  Laterality: Left;  ANESTHESIA: IV REGIONAL FAB   Family History  Problem Relation Age of Onset  . Hypertension Mother   . Stroke Mother   . Stroke Father   . Hypertension Father    Social History   Socioeconomic History  . Marital status: Married    Spouse name: Not on file  . Number of children: Not on file  . Years of education: Not on file  . Highest education level: Not on file  Occupational History  . Occupation: retired  Scientific laboratory technician  . Financial resource strain: Not on file  . Food insecurity:    Worry: Not on file    Inability: Not on file  . Transportation needs:    Medical: Not on file    Non-medical: Not on file  Tobacco Use  . Smoking status: Never Smoker  . Smokeless tobacco: Never Used  Substance and Sexual Activity  . Alcohol use: No  . Drug use: No  . Sexual activity: Not Currently  Lifestyle  . Physical activity:    Days per week:  Not on file    Minutes per session: Not on file  . Stress: Not on file  Relationships  . Social connections:    Talks on phone: Not on file    Gets together: Not on file    Attends religious service: Not on file    Active member of club or organization: Not on file    Attends meetings of clubs or organizations: Not on file    Relationship status: Not on file  Other Topics Concern  . Not on file  Social History Narrative  . Not on file    Outpatient Encounter Medications as of 08/11/2018  Medication Sig  . amLODipine (NORVASC) 10 MG tablet Take 10 mg by mouth daily.  Marland Kitchen aspirin 81 MG tablet Take 81 mg by mouth every evening.   . carvedilol (COREG) 12.5 MG tablet Take  12.5 mg by mouth 2 (two) times daily.  . cholecalciferol (VITAMIN D) 1000 units tablet Take 5,000 Units by mouth daily.   . furosemide (LASIX) 20 MG tablet Take 20 mg by mouth daily.   Marland Kitchen KLOR-CON M20 20 MEQ tablet Take 20 mEq by mouth daily.   Marland Kitchen losartan-hydrochlorothiazide (HYZAAR) 100-12.5 MG tablet Take 1 tablet by mouth daily.  . rosuvastatin (CRESTOR) 10 MG tablet Take 10 mg by mouth daily.  . traMADol (ULTRAM) 50 MG tablet Take 1-2 tablets (50-100 mg total) by mouth every 6 (six) hours as needed. (Patient not taking: Reported on 08/11/2018)   No facility-administered encounter medications on file as of 08/11/2018.     Activities of Daily Living In your present state of health, do you have any difficulty performing the following activities: 08/11/2018 06/16/2018  Hearing? N N  Vision? N N  Difficulty concentrating or making decisions? N N  Walking or climbing stairs? N N  Dressing or bathing? N N  Doing errands, shopping? N -  Preparing Food and eating ? N -  Using the Toilet? N -  In the past six months, have you accidently leaked urine? Y -  Comment if wait too long, stress incont at times -  Do you have problems with loss of bowel control? N -  Managing your Medications? N -  Managing your Finances? N -  Housekeeping or managing your Housekeeping? N -  Some recent data might be hidden    Patient Care Team: Glendale Chard, MD as PCP - General (Internal Medicine)    Assessment:   This is a routine wellness examination for Jeraldean.  Exercise Activities and Dietary recommendations Current Exercise Habits: The patient does not participate in regular exercise at present, Exercise limited by: None identified  Goals    . DIET - INCREASE WATER INTAKE (pt-stated)       Fall Risk Fall Risk  08/11/2018  Falls in the past year? 0  Risk for fall due to : Medication side effect   Is the patient's home free of loose throw rugs in walkways, pet beds, electrical cords, etc?   yes       Grab bars in the bathroom? yes      Handrails on the stairs?   n/a      Adequate lighting?   yes  Timed Get Up and Go performed: n/a  Depression Screen PHQ 2/9 Scores 08/11/2018  PHQ - 2 Score 0  PHQ- 9 Score 0     Cognitive Function     6CIT Screen 08/11/2018  What Year? 0 points  What month? 0 points  What  time? 0 points  Count back from 20 0 points  Months in reverse 0 points  Repeat phrase 0 points  Total Score 0    Immunization History  Administered Date(s) Administered  . Tdap 03/03/2018    Qualifies for Shingles Vaccine? yes  Screening Tests Health Maintenance  Topic Date Due  . DEXA SCAN  07/26/2003  . INFLUENZA VACCINE  05/05/2018  . PNA vac Low Risk Adult (1 of 2 - PCV13) 08/12/2019 (Originally 07/26/2003)  . TETANUS/TDAP  03/03/2028    Cancer Screenings: Lung: Low Dose CT Chest recommended if Age 68-80 years, 30 pack-year currently smoking OR have quit w/in 15years. Patient does not qualify. Breast:  Up to date on Mammogram? Yes   Up to date of Bone Density/Dexa? Yes Colorectal: not required  Additional Screenings: : Hepatitis C Screening: n/a     Plan:    Patient states that she believes she got the flu vaccine this season.  She refuses pneumonia vaccines. She has a goal to increase her water intake.  I have personally reviewed and noted the following in the patient's chart:   . Medical and social history . Use of alcohol, tobacco or illicit drugs  . Current medications and supplements . Functional ability and status . Nutritional status . Physical activity . Advanced directives . List of other physicians . Hospitalizations, surgeries, and ER visits in previous 12 months . Vitals . Screenings to include cognitive, depression, and falls . Referrals and appointments  In addition, I have reviewed and discussed with patient certain preventive protocols, quality metrics, and best practice recommendations. A written personalized care plan  for preventive services as well as general preventive health recommendations were provided to patient.     Kellie Simmering, LPN  33/05/3290

## 2018-08-12 ENCOUNTER — Other Ambulatory Visit: Payer: Self-pay | Admitting: Nurse Practitioner

## 2018-08-12 DIAGNOSIS — Z1272 Encounter for screening for malignant neoplasm of vagina: Secondary | ICD-10-CM

## 2018-08-12 DIAGNOSIS — N898 Other specified noninflammatory disorders of vagina: Secondary | ICD-10-CM

## 2018-08-12 LAB — CMP14 + ANION GAP
ALT: 9 IU/L (ref 0–32)
ANION GAP: 19 mmol/L — AB (ref 10.0–18.0)
AST: 18 IU/L (ref 0–40)
Albumin/Globulin Ratio: 2.1 (ref 1.2–2.2)
Albumin: 4.7 g/dL (ref 3.5–4.7)
Alkaline Phosphatase: 78 IU/L (ref 39–117)
BILIRUBIN TOTAL: 0.5 mg/dL (ref 0.0–1.2)
BUN/Creatinine Ratio: 17 (ref 12–28)
BUN: 11 mg/dL (ref 8–27)
CALCIUM: 10.5 mg/dL — AB (ref 8.7–10.3)
CO2: 26 mmol/L (ref 20–29)
Chloride: 99 mmol/L (ref 96–106)
Creatinine, Ser: 0.66 mg/dL (ref 0.57–1.00)
GFR, EST AFRICAN AMERICAN: 96 mL/min/{1.73_m2} (ref >59)
GFR, EST NON AFRICAN AMERICAN: 84 mL/min/{1.73_m2} (ref >59)
GLUCOSE: 109 mg/dL — AB (ref 65–99)
Globulin, Total: 2.2 g/dL (ref 1.5–4.5)
POTASSIUM: 3.6 mmol/L (ref 3.5–5.2)
SODIUM: 144 mmol/L (ref 134–144)
TOTAL PROTEIN: 6.9 g/dL (ref 6.0–8.5)

## 2018-08-12 LAB — LIPID PANEL
CHOL/HDL RATIO: 2.2 ratio (ref 0.0–4.4)
Cholesterol, Total: 169 mg/dL (ref 100–199)
HDL: 78 mg/dL (ref >39)
LDL CALC: 77 mg/dL (ref 0–99)
Triglycerides: 71 mg/dL (ref 0–149)
VLDL Cholesterol Cal: 14 mg/dL (ref 5–40)

## 2018-08-12 LAB — CBC
Hematocrit: 37.7 % (ref 34.0–46.6)
Hemoglobin: 12.3 g/dL (ref 11.1–15.9)
MCH: 29.6 pg (ref 26.6–33.0)
MCHC: 32.6 g/dL (ref 31.5–35.7)
MCV: 91 fL (ref 79–97)
PLATELETS: 286 10*3/uL (ref 150–450)
RBC: 4.15 x10E6/uL (ref 3.77–5.28)
RDW: 12.5 % (ref 12.3–15.4)
WBC: 5.4 10*3/uL (ref 3.4–10.8)

## 2018-08-12 LAB — TSH: TSH: 1.23 u[IU]/mL (ref 0.450–4.500)

## 2018-08-12 LAB — T3, FREE: T3, Free: 2.7 pg/mL (ref 2.0–4.4)

## 2018-08-12 LAB — T4, FREE: Free T4: 1.2 ng/dL (ref 0.82–1.77)

## 2018-08-15 LAB — ANAEROBIC AND AEROBIC CULTURE

## 2018-08-16 LAB — CYTOLOGY - PAP: DIAGNOSIS: NEGATIVE

## 2018-08-16 LAB — CERVICOVAGINAL ANCILLARY ONLY
BACTERIAL VAGINITIS: NEGATIVE
Candida vaginitis: NEGATIVE

## 2018-08-17 ENCOUNTER — Other Ambulatory Visit: Payer: Self-pay | Admitting: Internal Medicine

## 2018-08-17 MED ORDER — CIPROFLOXACIN HCL 500 MG PO TABS: 500.0000 mg | ORAL_TABLET | Freq: Two times a day (BID) | ORAL | 0 refills | Status: AC

## 2018-08-24 DIAGNOSIS — E210 Primary hyperparathyroidism: Secondary | ICD-10-CM | POA: Diagnosis not present

## 2018-09-06 ENCOUNTER — Other Ambulatory Visit: Payer: Self-pay

## 2018-09-08 ENCOUNTER — Telehealth: Payer: Self-pay

## 2018-09-08 ENCOUNTER — Other Ambulatory Visit: Payer: Self-pay | Admitting: Internal Medicine

## 2018-09-08 DIAGNOSIS — N898 Other specified noninflammatory disorders of vagina: Secondary | ICD-10-CM

## 2018-09-08 MED ORDER — AMOXICILLIN-POT CLAVULANATE 875-125 MG PO TABS: 1.0000 | ORAL_TABLET | Freq: Two times a day (BID) | ORAL | 0 refills | Status: AC

## 2018-09-08 NOTE — Progress Notes (Unsigned)
OK, we can try Augmentin instead, but if there is a connection from the rectum to the vagina, it may not go away.  ===View-only below this line===  ----- Message ----- From: Octavio Manns Sent: 09/08/2018  11:20 AM EST To: Shelby Mattocks, PA-C Subject: medication not working                         Pt says that the cipro you prescribed isnt working.

## 2018-09-08 NOTE — Telephone Encounter (Signed)
-----   Message from Shelby Mattocks, [place] sent at 09/08/2018 11:23 AM EST ----- Regarding: RE: medication not working OK, we can try Augmentin instead, but if there is a connection from the rectum to the vagina, it may not go away.  ----- Message ----- From: Octavio Manns Sent: 09/08/2018  11:20 AM EST To: Shelby Mattocks, PA-C Subject: medication not working                         Pt says that the cipro you prescribed isnt working.

## 2018-09-21 ENCOUNTER — Encounter: Payer: Self-pay | Admitting: Internal Medicine

## 2018-09-21 ENCOUNTER — Ambulatory Visit (INDEPENDENT_AMBULATORY_CARE_PROVIDER_SITE_OTHER): Payer: Medicare Other | Admitting: Internal Medicine

## 2018-09-21 VITALS — BP 130/70 | HR 56 | Temp 98.0°F | Ht 61.0 in | Wt 139.6 lb

## 2018-09-21 DIAGNOSIS — Z8742 Personal history of other diseases of the female genital tract: Secondary | ICD-10-CM

## 2018-09-21 DIAGNOSIS — R151 Fecal smearing: Secondary | ICD-10-CM

## 2018-09-21 DIAGNOSIS — Z09 Encounter for follow-up examination after completed treatment for conditions other than malignant neoplasm: Secondary | ICD-10-CM

## 2018-09-21 DIAGNOSIS — I10 Essential (primary) hypertension: Secondary | ICD-10-CM | POA: Diagnosis not present

## 2018-09-21 DIAGNOSIS — N898 Other specified noninflammatory disorders of vagina: Secondary | ICD-10-CM

## 2018-09-21 MED ORDER — FUROSEMIDE 20 MG PO TABS: ORAL_TABLET | ORAL | 0 refills | Status: DC

## 2018-09-21 MED ORDER — KLOR-CON M20 20 MEQ PO TBCR: 20.0000 meq | EXTENDED_RELEASE_TABLET | Freq: Every day | ORAL | 0 refills | Status: DC

## 2018-09-21 NOTE — Progress Notes (Signed)
Subjective:     Patient ID: Erica Meadows , female    DOB: 1936-05-11 , 82 y.o.   MRN: [id number]   Chief Complaint  Patient presents with  . Follow-up    HPI  Pt is here for FU brown vaginal discharge and has resolved. Will see urologist next month for urinary stream abnormality. Sometimes finds stool on her after hours of having a BM and does not feel it. She denies full BM incontinence. Denies constipation. This has been going on for several months. She has made sure she cleans herself well.   Past Medical History:  Diagnosis Date  . High cholesterol   . Hyperparathyroidism ([place])   . Hypertension    under control; has been on med. x 15-20 yr.  . Seasonal allergies   . Trigger thumb of left hand 12/2012     Family History  Problem Relation Age of Onset  . Hypertension Mother   . Stroke Mother   . Stroke Father   . Hypertension Father      Current Outpatient Medications:  .  amLODipine (NORVASC) 10 MG tablet, Take 10 mg by mouth daily., Disp: , Rfl:  .  aspirin 81 MG tablet, Take 81 mg by mouth every evening. , Disp: , Rfl:  .  carvedilol (COREG) 12.5 MG tablet, Take 12.5 mg by mouth 2 (two) times daily., Disp: , Rfl: 5 .  cholecalciferol (VITAMIN D) 1000 units tablet, Take 5,000 Units by mouth daily. , Disp: , Rfl:  .  furosemide (LASIX) 20 MG tablet, Take 20 mg by mouth daily. , Disp: , Rfl:  .  KLOR-CON M20 20 MEQ tablet, Take 20 mEq by mouth daily. , Disp: , Rfl: 11 .  losartan-hydrochlorothiazide (HYZAAR) 100-12.5 MG tablet, Take 1 tablet by mouth daily., Disp: , Rfl: 1 .  rosuvastatin (CRESTOR) 10 MG tablet, Take 10 mg by mouth daily., Disp: , Rfl:  .  traMADol (ULTRAM) 50 MG tablet, Take 1-2 tablets (50-100 mg total) by mouth every 6 (six) hours as needed., Disp: 15 tablet, Rfl: 0   No Known Allergies   Review of Systems  Constitutional: Negative for chills, diaphoresis and fever.  Respiratory: Negative for chest tightness and shortness of breath.    Cardiovascular: Negative for chest pain, palpitations and leg swelling.  Gastrointestinal: Negative for constipation, diarrhea, nausea and vomiting.  Genitourinary: Negative for dysuria and vaginal discharge.  Musculoskeletal: Positive for arthralgias.       R lateral hand sole    Today's Vitals   09/21/18 1026  BP: 130/70  Pulse: (!) 56  Temp: 98 F (36.7 C)  TempSrc: Oral  SpO2: 96%  Weight: 139 lb 9.6 oz (63.3 kg)  Height: 5\' 1"  (1.549 m)   Body mass index is 26.38 kg/m.   Objective:  Physical Exam  Constitutional: She is oriented to person, place, and time. She appears well-developed and well-nourished. No distress.  HENT:  Head: Normocephalic and atraumatic.  Right Ear: External ear normal.  Left Ear: External ear normal.  Nose: Nose normal.  Eyes: Conjunctivae are normal. Right eye exhibits no discharge. Left eye exhibits no discharge. No scleral icterus.  Neck: Neck supple. No thyromegaly present.  No carotid bruits bilaterally  Cardiovascular: Normal rate and regular rhythm.  No murmur heard. Pulmonary/Chest: Effort normal and breath sounds normal. No respiratory distress.  Musculoskeletal: Normal range of motion. She exhibits no edema.  Lymphadenopathy:    She has no cervical adenopathy.  Neurological: She is alert and  oriented to person, place, and time.  Skin: Skin is warm and dry. Capillary refill takes less than 2 seconds. No rash noted. She is not diaphoretic.  Psychiatric: She has a normal mood and affect. Her behavior is normal. Judgment and thought content normal.  Nursing note reviewed.  Assessment And Plan:    1. Essential hypertension- Stable. May continue same meds. No labs needed today.  2. Vaginal discharge- resolved. May have come from stool she gets hours after having a BM.   3. Fecal smearing- chronic. I offered her to try PT, or referral to general surgeon, but she wants to think about it.  FU with Dr Baird Cancer for HTN in 3 weeks.     Lia Vigilante RODRIGUEZ-SOUTHWORTH, PA-C

## 2018-10-27 ENCOUNTER — Telehealth: Payer: Self-pay

## 2018-10-27 NOTE — Telephone Encounter (Signed)
Called pt and LVM for pt to call office back to schedule an appointment to follow-up for vaginal concerns.

## 2018-10-31 DIAGNOSIS — N3582 Other urethral stricture, female: Secondary | ICD-10-CM | POA: Diagnosis not present

## 2018-10-31 DIAGNOSIS — N952 Postmenopausal atrophic vaginitis: Secondary | ICD-10-CM | POA: Diagnosis not present

## 2018-10-31 DIAGNOSIS — R3913 Splitting of urinary stream: Secondary | ICD-10-CM | POA: Diagnosis not present

## 2018-11-01 ENCOUNTER — Encounter: Payer: Self-pay | Admitting: Internal Medicine

## 2018-11-01 ENCOUNTER — Ambulatory Visit (INDEPENDENT_AMBULATORY_CARE_PROVIDER_SITE_OTHER): Payer: Medicare Other | Admitting: Internal Medicine

## 2018-11-01 VITALS — BP 132/80 | HR 63 | Temp 97.7°F | Ht 61.0 in | Wt 141.4 lb

## 2018-11-01 DIAGNOSIS — R600 Localized edema: Secondary | ICD-10-CM | POA: Diagnosis not present

## 2018-11-01 DIAGNOSIS — N898 Other specified noninflammatory disorders of vagina: Secondary | ICD-10-CM | POA: Insufficient documentation

## 2018-11-01 LAB — POCT URINALYSIS DIPSTICK
Bilirubin, UA: NEGATIVE
Glucose, UA: NEGATIVE
Ketones, UA: NEGATIVE
Leukocytes, UA: NEGATIVE
Nitrite, UA: NEGATIVE
Protein, UA: POSITIVE — AB
Spec Grav, UA: 1.02 (ref 1.010–1.025)
Urobilinogen, UA: 1 E.U./dL
pH, UA: 7 (ref 5.0–8.0)

## 2018-11-01 NOTE — Progress Notes (Signed)
Subjective:     Patient ID: Erica Meadows , female    DOB: 1936/06/06 , 83 y.o.   MRN: [id number]   Chief Complaint  Patient presents with  . Vaginal Discharge    HPI  Here for recurrent brown vaginal discharge since last week. Saw Urology and was placed on estrogen cream. Pelvic PT was not mentioned. She has apt for FU next week.   Past Medical History:  Diagnosis Date  . High cholesterol   . Hyperparathyroidism ([place])   . Hypertension    under control; has been on med. x 15-20 yr.  . Seasonal allergies   . Trigger thumb of left hand 12/2012     Family History  Problem Relation Age of Onset  . Hypertension Mother   . Stroke Mother   . Stroke Father   . Hypertension Father      Current Outpatient Medications:  .  amLODipine (NORVASC) 10 MG tablet, Take 10 mg by mouth daily., Disp: , Rfl:  .  aspirin 81 MG tablet, Take 81 mg by mouth every evening. , Disp: , Rfl:  .  carvedilol (COREG) 12.5 MG tablet, Take 12.5 mg by mouth 2 (two) times daily., Disp: , Rfl: 5 .  cholecalciferol (VITAMIN D) 1000 units tablet, Take 5,000 Units by mouth daily. , Disp: , Rfl:  .  furosemide (LASIX) 20 MG tablet, Take 2 on Tuesdays and Fridays, then one a day the rest of the day., Disp: 300 tablet, Rfl: 0 .  KLOR-CON M20 20 MEQ tablet, Take 1 tablet (20 mEq total) by mouth daily., Disp: 90 tablet, Rfl: 0 .  losartan-hydrochlorothiazide (HYZAAR) 100-12.5 MG tablet, Take 1 tablet by mouth daily., Disp: , Rfl: 1 .  rosuvastatin (CRESTOR) 10 MG tablet, Take 10 mg by mouth daily., Disp: , Rfl:    No Known Allergies   Review of Systems  Constitutional: Negative for chills, diaphoresis and fever.  Respiratory: Negative for shortness of breath.   Cardiovascular: Positive for leg swelling. Negative for chest pain.       Noticed her ankles are swollen this am, she denies adding salt to her food and washes her canned vegetables.   Genitourinary: Positive for vaginal discharge.     Today's  Vitals   11/01/18 1053  BP: 132/80  Pulse: 63  Temp: 97.7 F (36.5 C)  TempSrc: Oral  SpO2: 96%  Weight: 141 lb 6.4 oz (64.1 kg)  Height: 5\' 1"  (1.549 m)  PainSc: 0-No pain   Body mass index is 26.72 kg/m.   Objective:  Physical Exam Vitals signs and nursing note reviewed.  Constitutional:      Appearance: Normal appearance. She is normal weight.  HENT:     Head: Normocephalic.     Right Ear: External ear normal.     Left Ear: External ear normal.     Nose: Nose normal.  Eyes:     General: No scleral icterus. Neck:     Musculoskeletal: Neck supple. No neck rigidity.  Cardiovascular:     Pulses: Normal pulses.     Comments: Has +1/4 pitting edema of ankles Pulmonary:     Effort: Pulmonary effort is normal.  Genitourinary:    General: Normal vulva.     Vagina: Vaginal discharge present.     Comments: Has light brown discharge in vaginal cuff.  Lymphadenopathy:     Cervical: No cervical adenopathy.  Neurological:     Mental Status: She is alert.      Assessment  And Plan:    1. Vaginal discharge- acute and recurrent.  - Anaerobic and Aerobic Culture - NuSwab Vaginitis (VG) - POCT Urinalysis Dipstick (81002)  2. Localized edema-acute. Advised to avoid high sodium foods. FU if they persist.   I will call her when the results are back before I start treatment,    Eliot Bencivenga RODRIGUEZ-SOUTHWORTH, PA-C

## 2018-11-02 ENCOUNTER — Other Ambulatory Visit (HOSPITAL_COMMUNITY)
Admission: RE | Admit: 2018-11-02 | Discharge: 2018-11-02 | Disposition: A | Discharge status: Home / Self Care | Payer: Medicare Other | Source: Ambulatory Visit | Attending: Nurse Practitioner | Admitting: Nurse Practitioner

## 2018-11-02 ENCOUNTER — Other Ambulatory Visit: Payer: Self-pay | Admitting: Nurse Practitioner

## 2018-11-02 DIAGNOSIS — N898 Other specified noninflammatory disorders of vagina: Secondary | ICD-10-CM

## 2018-11-03 LAB — CYTOLOGY, (ORAL, ANAL, URETHRAL) ANCILLARY ONLY
Bacterial vaginitis: NEGATIVE
Candida vaginitis: NEGATIVE
Chlamydia: NEGATIVE
Neisseria Gonorrhea: NEGATIVE
Trichomonas: NEGATIVE

## 2018-11-04 ENCOUNTER — Other Ambulatory Visit: Payer: Self-pay | Admitting: Internal Medicine

## 2018-11-05 LAB — ANAEROBIC AND AEROBIC CULTURE

## 2018-11-12 ENCOUNTER — Other Ambulatory Visit: Payer: Self-pay | Admitting: Internal Medicine

## 2018-11-15 ENCOUNTER — Ambulatory Visit: Payer: Medicare Other | Admitting: Internal Medicine

## 2018-11-30 ENCOUNTER — Other Ambulatory Visit: Payer: Self-pay | Admitting: Internal Medicine

## 2018-11-30 DIAGNOSIS — R3913 Splitting of urinary stream: Secondary | ICD-10-CM | POA: Diagnosis not present

## 2018-12-12 ENCOUNTER — Other Ambulatory Visit: Payer: Self-pay | Admitting: Internal Medicine

## 2018-12-13 ENCOUNTER — Encounter: Payer: Self-pay | Admitting: Internal Medicine

## 2018-12-13 ENCOUNTER — Ambulatory Visit (INDEPENDENT_AMBULATORY_CARE_PROVIDER_SITE_OTHER): Payer: Medicare Other | Admitting: Internal Medicine

## 2018-12-13 VITALS — BP 132/68 | HR 68 | Ht 61.0 in | Wt 139.6 lb

## 2018-12-13 DIAGNOSIS — E2839 Other primary ovarian failure: Secondary | ICD-10-CM

## 2018-12-13 DIAGNOSIS — I10 Essential (primary) hypertension: Secondary | ICD-10-CM

## 2018-12-13 DIAGNOSIS — N952 Postmenopausal atrophic vaginitis: Secondary | ICD-10-CM | POA: Diagnosis not present

## 2018-12-13 DIAGNOSIS — R7309 Other abnormal glucose: Secondary | ICD-10-CM | POA: Diagnosis not present

## 2018-12-13 DIAGNOSIS — N368 Other specified disorders of urethra: Secondary | ICD-10-CM | POA: Diagnosis not present

## 2018-12-13 NOTE — Progress Notes (Signed)
Subjective:     Patient ID: Erica Meadows , female    DOB: January 30, 1936 , 83 y.o.   MRN: [id number]   Chief Complaint  Patient presents with  . Hypertension    follow up for htn     HPI  Hypertension  This is a chronic problem. The current episode started more than 1 year ago. The problem has been gradually improving since onset. The problem is controlled. Pertinent negatives include no blurred vision, chest pain, palpitations or shortness of breath. Risk factors for coronary artery disease include post-menopausal state.     Past Medical History:  Diagnosis Date  . High cholesterol   . Hyperparathyroidism ([place])   . Hypertension    under control; has been on med. x 15-20 yr.  . Seasonal allergies   . Trigger thumb of left hand 12/2012     Family History  Problem Relation Age of Onset  . Hypertension Mother   . Stroke Mother   . Stroke Father   . Hypertension Father      Current Outpatient Medications:  .  amLODipine (NORVASC) 10 MG tablet, Take 10 mg by mouth daily., Disp: , Rfl:  .  aspirin 81 MG tablet, Take 81 mg by mouth every evening. , Disp: , Rfl:  .  carvedilol (COREG) 12.5 MG tablet, TAKE 1 TABLET BY MOUTH TWICE A DAY WITH FOOD, Disp: 180 tablet, Rfl: 1 .  cholecalciferol (VITAMIN D) 1000 units tablet, Take 5,000 Units by mouth daily. , Disp: , Rfl:  .  furosemide (LASIX) 20 MG tablet, TAKE 2 ON TUESDAYS AND FRIDAYS, THEN ONE A DAY THE REST OF THE DAYS IN THE WEEK, Disp: 300 tablet, Rfl: 0 .  KLOR-CON M20 20 MEQ tablet, TAKE 1 TABLET BY MOUTH EVERY DAY, Disp: 90 tablet, Rfl: 0 .  losartan-hydrochlorothiazide (HYZAAR) 100-12.5 MG tablet, TAKE 1 TABLET BY MOUTH EVERY DAY, Disp: 90 tablet, Rfl: 1 .  rosuvastatin (CRESTOR) 10 MG tablet, Take 10 mg by mouth daily., Disp: , Rfl:    No Known Allergies   Review of Systems  Constitutional: Negative.   Eyes: Negative for blurred vision.  Respiratory: Negative.  Negative for shortness of breath.   Cardiovascular:  Negative.  Negative for chest pain and palpitations.  Gastrointestinal: Negative.   Genitourinary:       States her urine sprayed when she urinates. She was seen by Urology, diagnosed with urethral prolapse. She has since been started on vaginal premarin cream.   Neurological: Negative.   Psychiatric/Behavioral: Negative.      Today's Vitals   12/13/18 1210  BP: 132/68  Pulse: 68  SpO2: 97%  Weight: 139 lb 9.6 oz (63.3 kg)  Height: 5' 1"  (1.549 m)   Body mass index is 26.38 kg/m.   Objective:  Physical Exam Vitals signs and nursing note reviewed.  Constitutional:      Appearance: Normal appearance.  HENT:     Head: Normocephalic and atraumatic.  Cardiovascular:     Rate and Rhythm: Normal rate and regular rhythm.     Heart sounds: Normal heart sounds.  Pulmonary:     Effort: Pulmonary effort is normal.     Breath sounds: Normal breath sounds.  Skin:    General: Skin is warm.  Neurological:     General: No focal deficit present.     Mental Status: She is alert.  Psychiatric:        Mood and Affect: Mood normal.  Behavior: Behavior normal.         Assessment And Plan:     1. Essential hypertension  Controlled. She will continue with current meds. Pt is aware that optimal control is less than 130/80. She will rto in Nov 2020 for her next AWV.   - BMP8+EGFR  2. Other abnormal glucose  HER A1C HAS BEEN ELEVATED IN THE PAST. I WILL CHECK AN A1C, BMET TODAY. SHE WAS ENCOURAGED TO AVOID SUGARY BEVERAGES AND PROCESSED FOODS INCLUDNG BREADS, RICE AND PASTA.  - Hemoglobin A1c  3. Urethral prolapse  She has had urology evaluation. She has noticed some improvement with use of perineal topical progesterone supplementation.   4. Vaginitis, atrophic  I will request Urology notes. She will continue with premarin vaginal cream three days weekly.   5. Estrogen deficiency  I will refer her to [facility] for dexa scan. Her last one was in 2017 with Dr. Garwin Brothers. I will  request a copy of this as well.   - DG Bone Density; Future        [name], MD

## 2018-12-14 LAB — BMP8+EGFR
BUN/Creatinine Ratio: 22 (ref 12–28)
BUN: 14 mg/dL (ref 8–27)
CO2: 27 mmol/L (ref 20–29)
Calcium: 10.3 mg/dL (ref 8.7–10.3)
Chloride: 101 mmol/L (ref 96–106)
Creatinine, Ser: 0.63 mg/dL (ref 0.57–1.00)
GFR calc Af Amer: 98 mL/min/{1.73_m2} (ref >59)
GFR calc non Af Amer: 85 mL/min/{1.73_m2} (ref >59)
Glucose: 119 mg/dL — ABNORMAL HIGH (ref 65–99)
Potassium: 3.6 mmol/L (ref 3.5–5.2)
Sodium: 141 mmol/L (ref 134–144)

## 2018-12-14 LAB — HEMOGLOBIN A1C
Est. average glucose Bld gHb Est-mCnc: 131 mg/dL
Hgb A1c MFr Bld: 6.2 % — ABNORMAL HIGH (ref 4.8–5.6)

## 2018-12-22 ENCOUNTER — Telehealth: Payer: Self-pay

## 2018-12-22 NOTE — Telephone Encounter (Signed)
Number disconnected letter mailed to the patient.

## 2018-12-22 NOTE — Telephone Encounter (Signed)
-----   Message from Glendale Chard, MD sent at 12/14/2018 11:21 PM EDT ----- Your hba1c is 6.3, this is in predm range. At 6.5, you are considered diabetic.  Your kidney fxn is nl.

## 2018-12-29 ENCOUNTER — Telehealth: Payer: Self-pay

## 2018-12-29 NOTE — Telephone Encounter (Signed)
The pt was notified of her last lab results. 

## 2019-01-26 ENCOUNTER — Other Ambulatory Visit: Payer: Self-pay | Admitting: Internal Medicine

## 2019-03-04 ENCOUNTER — Other Ambulatory Visit: Payer: Self-pay | Admitting: Internal Medicine

## 2019-03-05 ENCOUNTER — Other Ambulatory Visit: Payer: Self-pay | Admitting: Internal Medicine

## 2019-04-26 ENCOUNTER — Other Ambulatory Visit: Payer: Self-pay | Admitting: Internal Medicine

## 2019-05-22 ENCOUNTER — Other Ambulatory Visit: Payer: Self-pay | Admitting: Internal Medicine

## 2019-05-30 ENCOUNTER — Other Ambulatory Visit: Payer: Self-pay | Admitting: Nurse Practitioner

## 2019-05-30 ENCOUNTER — Telehealth: Payer: Self-pay | Admitting: Internal Medicine

## 2019-05-30 NOTE — Telephone Encounter (Signed)
I called the patient to change 06/14/2019 telephone AWV to video or in office.  She's not comfortable with technology for video, so she agreed to come into the office. VDM (DD)

## 2019-06-13 ENCOUNTER — Other Ambulatory Visit: Payer: Self-pay

## 2019-06-14 ENCOUNTER — Ambulatory Visit: Payer: Medicare Other

## 2019-06-14 ENCOUNTER — Ambulatory Visit (INDEPENDENT_AMBULATORY_CARE_PROVIDER_SITE_OTHER): Payer: Medicare Other

## 2019-06-14 VITALS — BP 115/58 | HR 60 | Temp 98.5°F | Ht 59.4 in | Wt 137.8 lb

## 2019-06-14 DIAGNOSIS — Z0000 Encounter for general adult medical examination without abnormal findings: Secondary | ICD-10-CM

## 2019-06-14 NOTE — Progress Notes (Signed)
Subjective:   Erica Meadows is a 83 y.o. female who presents for Medicare Annual (Subsequent) preventive examination.  Review of Systems:  n/a Cardiac Risk Factors include: advanced age (>38men, >27 women);hypertension;dyslipidemia;sedentary lifestyle     Objective:     Vitals: BP (!) 115/58 (BP Location: Left Arm, Patient Position: Sitting, Cuff Size: Normal)   Pulse 60   Temp 98.5 F (36.9 C) (Oral)   Ht 4' 11.4" (1.509 m)   Wt 137 lb 12.8 oz (62.5 kg)   BMI 27.46 kg/m   Body mass index is 27.46 kg/m.  Advanced Directives 06/14/2019 08/11/2018 06/16/2018 06/13/2018 12/08/2012  Does Patient Have a Medical Advance Directive? No No No No Patient does not have advance directive;Patient would like information  Would patient like information on creating a medical advance directive? - No - Patient declined No - Patient declined Yes (MAU/Ambulatory/Procedural Areas - Information given) Advance directive packet given    Tobacco Social History   Tobacco Use  Smoking Status Never Smoker  Smokeless Tobacco Never Used     Counseling given: Not Answered   Clinical Intake:  Pre-visit preparation completed: Yes  Pain : 0-10 Pain Score: 5  Pain Type: Acute pain Pain Location: Finger (Comment which one)(middle) Pain Orientation: Right, Left Pain Descriptors / Indicators: Aching Pain Onset: More than a month ago Pain Frequency: Intermittent Pain Relieving Factors: splints help some Effect of Pain on Daily Activities: drops things  Pain Relieving Factors: splints help some  Nutritional Status: BMI 25 -29 Overweight Nutritional Risks: None Diabetes: No  How often do you need to have someone help you when you read instructions, pamphlets, or other written materials from your doctor or pharmacy?: 1 - Never What is the last grade level you completed in school?: 1 year college  Interpreter Needed?: No  Information entered by :: NAllen LPN  Past Medical History:  Diagnosis  Date  . High cholesterol   . Hyperparathyroidism ([place])   . Hypertension    under control; has been on med. x 15-20 yr.  . Seasonal allergies   . Trigger thumb of left hand 12/2012   Past Surgical History:  Procedure Laterality Date  . ABDOMINAL HYSTERECTOMY  1975   partial  . HAND  SURGERY  Left 03/2018   ANTERIOR  HAND ;  CUT INTO TENDON ON HAND WHILE CHOPPING CABBAGE ;     . hysterectomy partial    . PARATHYROIDECTOMY Right 06/16/2018   Procedure: RIGHT INFERIOR PARATHYROIDECTOMY;  Surgeon: [name] Gemma, MD;  Location: [facility];  Service: General;  Laterality: Right;  . TRIGGER FINGER RELEASE Right   . TRIGGER FINGER RELEASE Left 12/12/2012   Procedure: RELEASE TRIGGER FINGER/A-1 PULLEY LEFT THUMB;  Surgeon: Wynonia Sours, MD;  Location: [facility];  Service: Orthopedics;  Laterality: Left;  ANESTHESIA: IV REGIONAL FAB   Family History  Problem Relation Age of Onset  . Hypertension Mother   . Stroke Mother   . Stroke Father   . Hypertension Father    Social History   Socioeconomic History  . Marital status: Married    Spouse name: Not on file  . Number of children: Not on file  . Years of education: Not on file  . Highest education level: Not on file  Occupational History  . Occupation: retired  Scientific laboratory technician  . Financial resource strain: Not hard at all  . Food insecurity    Worry: Never true    Inability: Never true  . Transportation  needs    Medical: No    Non-medical: No  Tobacco Use  . Smoking status: Never Smoker  . Smokeless tobacco: Never Used  Substance and Sexual Activity  . Alcohol use: No  . Drug use: No  . Sexual activity: Not Currently  Lifestyle  . Physical activity    Days per week: 0 days    Minutes per session: 0 min  . Stress: Not at all  Relationships  . Social Herbalist on phone: Not on file    Gets together: Not on file    Attends religious service: Not on file    Active member of club or organization: Not on  file    Attends meetings of clubs or organizations: Not on file    Relationship status: Not on file  Other Topics Concern  . Not on file  Social History Narrative  . Not on file    Outpatient Encounter Medications as of 06/14/2019  Medication Sig  . amLODipine (NORVASC) 10 MG tablet TAKE 1 TABLET BY MOUTH EVERY DAY  . aspirin 81 MG tablet Take 81 mg by mouth every evening.   . carvedilol (COREG) 12.5 MG tablet TAKE 1 TABLET BY MOUTH TWICE A DAY WITH FOOD  . cholecalciferol (VITAMIN D) 1000 units tablet Take 5,000 Units by mouth daily.   . furosemide (LASIX) 20 MG tablet TAKE 2 ON TUESDAYS AND FRIDAYS, THEN ONE A DAY THE REST OF THE DAYS IN THE WEEK  . KLOR-CON M20 20 MEQ tablet TAKE 1 TABLET BY MOUTH EVERY DAY  . losartan-hydrochlorothiazide (HYZAAR) 100-12.5 MG tablet TAKE 1 TABLET BY MOUTH EVERY DAY  . rosuvastatin (CRESTOR) 10 MG tablet TAKE 1 TABLET BY MOUTH EVERY DAY   No facility-administered encounter medications on file as of 06/14/2019.     Activities of Daily Living In your present state of health, do you have any difficulty performing the following activities: 06/14/2019 08/11/2018  Hearing? N N  Vision? N N  Difficulty concentrating or making decisions? N N  Walking or climbing stairs? N N  Dressing or bathing? N N  Doing errands, shopping? N N  Preparing Food and eating ? N N  Using the Toilet? N N  In the past six months, have you accidently leaked urine? Y Y  Comment - if wait too long, stress incont at times  Do you have problems with loss of bowel control? N N  Managing your Medications? N N  Managing your Finances? N N  Housekeeping or managing your Housekeeping? N N  Some recent data might be hidden    Patient Care Team: Glendale Chard, MD as PCP - General (Internal Medicine)    Assessment:   This is a routine wellness examination for Erica Meadows.  Exercise Activities and Dietary recommendations Current Exercise Habits: The patient does not participate in  regular exercise at present  Goals    . DIET - INCREASE WATER INTAKE (pt-stated)    . Patient Stated     06/14/2019, wants to keep A1C down so she does not get diabetes       Fall Risk Fall Risk  06/14/2019 11/01/2018 08/11/2018  Falls in the past year? 0 0 0  Risk for fall due to : Medication side effect - Medication side effect  Follow up Falls evaluation completed;Education provided;Falls prevention discussed - -   Is the patient's home free of loose throw rugs in walkways, pet beds, electrical cords, etc?   yes  Grab bars in the bathroom? yes      Handrails on the stairs?   yes      Adequate lighting?   yes  Timed Get Up and Go performed: n/a  Depression Screen PHQ 2/9 Scores 06/14/2019 11/01/2018 08/11/2018  PHQ - 2 Score 0 0 0  PHQ- 9 Score - - 0     Cognitive Function     6CIT Screen 06/14/2019 08/11/2018  What Year? 0 points 0 points  What month? 0 points 0 points  What time? 0 points 0 points  Count back from 20 0 points 0 points  Months in reverse 0 points 0 points  Repeat phrase 0 points 0 points  Total Score 0 0    Immunization History  Administered Date(s) Administered  . Tdap 03/03/2018    Qualifies for Shingles Vaccine? yes  Screening Tests Health Maintenance  Topic Date Due  . DEXA SCAN  07/26/2003  . INFLUENZA VACCINE  05/06/2019  . PNA vac Low Risk Adult (1 of 2 - PCV13) 08/12/2019 (Originally 07/26/2003)  . TETANUS/TDAP  03/03/2028    Cancer Screenings: Lung: Low Dose CT Chest recommended if Age 60-80 years, 30 pack-year currently smoking OR have quit w/in 15years. Patient does not qualify. Breast:  Up to date on Mammogram? Yes   Up to date of Bone Density/Dexa? No Colorectal: not required  Additional Screenings: : Hepatitis C Screening: n/a     Plan:    Patient wants to keep A1C down so she does not get diabetes.   I have personally reviewed and noted the following in the patient's chart:   . Medical and social history . Use of  alcohol, tobacco or illicit drugs  . Current medications and supplements . Functional ability and status . Nutritional status . Physical activity . Advanced directives . List of other physicians . Hospitalizations, surgeries, and ER visits in previous 12 months . Vitals . Screenings to include cognitive, depression, and falls . Referrals and appointments  In addition, I have reviewed and discussed with patient certain preventive protocols, quality metrics, and best practice recommendations. A written personalized care plan for preventive services as well as general preventive health recommendations were provided to patient.     Kellie Simmering, LPN  [phone]

## 2019-06-14 NOTE — Patient Instructions (Signed)
Erica Meadows , Thank you for taking time to come for your Medicare Wellness Visit. I appreciate your ongoing commitment to your health goals. Please review the following plan we discussed and let me know if I can assist you in the future.   Screening recommendations/referrals: Colonoscopy: 04/2014 Mammogram: 07/2018 Bone Density: 2017 Recommended yearly ophthalmology/optometry visit for glaucoma screening and checkup Recommended yearly dental visit for hygiene and checkup  Vaccinations: Influenza vaccine: declines at this time Pneumococcal vaccine: postponed Tdap vaccine: 02/2018 Shingles vaccine: discussed    Advanced directives: Advance directive discussed with you today. Even though you declined this today please call our office should you change your mind and we can give you the proper paperwork for you to fill out.   Conditions/risks identified: overweight  Next appointment: 08/23/2019 at 2:30   Preventive Care 44 Years and Older, Female Preventive care refers to lifestyle choices and visits with your health care provider that can promote health and wellness. What does preventive care include?  A yearly physical exam. This is also called an annual well check.  Dental exams once or twice a year.  Routine eye exams. Ask your health care provider how often you should have your eyes checked.  Personal lifestyle choices, including:  Daily care of your teeth and gums.  Regular physical activity.  Eating a healthy diet.  Avoiding tobacco and drug use.  Limiting alcohol use.  Practicing safe sex.  Taking low-dose aspirin every day.  Taking vitamin and mineral supplements as recommended by your health care provider. What happens during an annual well check? The services and screenings done by your health care provider during your annual well check will depend on your age, overall health, lifestyle risk factors, and family history of disease. Counseling  Your health care  provider may ask you questions about your:  Alcohol use.  Tobacco use.  Drug use.  Emotional well-being.  Home and relationship well-being.  Sexual activity.  Eating habits.  History of falls.  Memory and ability to understand (cognition).  Work and work Statistician.  Reproductive health. Screening  You may have the following tests or measurements:  Height, weight, and BMI.  Blood pressure.  Lipid and cholesterol levels. These may be checked every 5 years, or more frequently if you are over 80 years old.  Skin check.  Lung cancer screening. You may have this screening every year starting at age 14 if you have a 30-pack-year history of smoking and currently smoke or have quit within the past 15 years.  Fecal occult blood test (FOBT) of the stool. You may have this test every year starting at age 41.  Flexible sigmoidoscopy or colonoscopy. You may have a sigmoidoscopy every 5 years or a colonoscopy every 10 years starting at age 69.  Hepatitis C blood test.  Hepatitis B blood test.  Sexually transmitted disease (STD) testing.  Diabetes screening. This is done by checking your blood sugar (glucose) after you have not eaten for a while (fasting). You may have this done every 1-3 years.  Bone density scan. This is done to screen for osteoporosis. You may have this done starting at age 66.  Mammogram. This may be done every 1-2 years. Talk to your health care provider about how often you should have regular mammograms. Talk with your health care provider about your test results, treatment options, and if necessary, the need for more tests. Vaccines  Your health care provider may recommend certain vaccines, such as:  Influenza vaccine. This  is recommended every year.  Tetanus, diphtheria, and acellular pertussis (Tdap, Td) vaccine. You may need a Td booster every 10 years.  Zoster vaccine. You may need this after age 10.  Pneumococcal 13-valent conjugate (PCV13)  vaccine. One dose is recommended after age 54.  Pneumococcal polysaccharide (PPSV23) vaccine. One dose is recommended after age 44. Talk to your health care provider about which screenings and vaccines you need and how often you need them. This information is not intended to replace advice given to you by your health care provider. Make sure you discuss any questions you have with your health care provider. Document Released: 10/18/2015 Document Revised: 06/10/2016 Document Reviewed: 07/23/2015 Elsevier Interactive Patient Education  2017 [place] Prevention in the Home Falls can cause injuries. They can happen to people of all ages. There are many things you can do to make your home safe and to help prevent falls. What can I do on the outside of my home?  Regularly fix the edges of walkways and driveways and fix any cracks.  Remove anything that might make you trip as you walk through a door, such as a raised step or threshold.  Trim any bushes or trees on the path to your home.  Use bright outdoor lighting.  Clear any walking paths of anything that might make someone trip, such as rocks or tools.  Regularly check to see if handrails are loose or broken. Make sure that both sides of any steps have handrails.  Any raised decks and porches should have guardrails on the edges.  Have any leaves, snow, or ice cleared regularly.  Use sand or salt on walking paths during winter.  Clean up any spills in your garage right away. This includes oil or grease spills. What can I do in the bathroom?  Use night lights.  Install grab bars by the toilet and in the tub and shower. Do not use towel bars as grab bars.  Use non-skid mats or decals in the tub or shower.  If you need to sit down in the shower, use a plastic, non-slip stool.  Keep the floor dry. Clean up any water that spills on the floor as soon as it happens.  Remove soap buildup in the tub or shower regularly.   Attach bath mats securely with double-sided non-slip rug tape.  Do not have throw rugs and other things on the floor that can make you trip. What can I do in the bedroom?  Use night lights.  Make sure that you have a light by your bed that is easy to reach.  Do not use any sheets or blankets that are too big for your bed. They should not hang down onto the floor.  Have a firm chair that has side arms. You can use this for support while you get dressed.  Do not have throw rugs and other things on the floor that can make you trip. What can I do in the kitchen?  Clean up any spills right away.  Avoid walking on wet floors.  Keep items that you use a lot in easy-to-reach places.  If you need to reach something above you, use a strong step stool that has a grab bar.  Keep electrical cords out of the way.  Do not use floor polish or wax that makes floors slippery. If you must use wax, use non-skid floor wax.  Do not have throw rugs and other things on the floor that can make you  trip. What can I do with my stairs?  Do not leave any items on the stairs.  Make sure that there are handrails on both sides of the stairs and use them. Fix handrails that are broken or loose. Make sure that handrails are as long as the stairways.  Check any carpeting to make sure that it is firmly attached to the stairs. Fix any carpet that is loose or worn.  Avoid having throw rugs at the top or bottom of the stairs. If you do have throw rugs, attach them to the floor with carpet tape.  Make sure that you have a light switch at the top of the stairs and the bottom of the stairs. If you do not have them, ask someone to add them for you. What else can I do to help prevent falls?  Wear shoes that:  Do not have high heels.  Have rubber bottoms.  Are comfortable and fit you well.  Are closed at the toe. Do not wear sandals.  If you use a stepladder:  Make sure that it is fully opened. Do not climb  a closed stepladder.  Make sure that both sides of the stepladder are locked into place.  Ask someone to hold it for you, if possible.  Clearly mark and make sure that you can see:  Any grab bars or handrails.  First and last steps.  Where the edge of each step is.  Use tools that help you move around (mobility aids) if they are needed. These include:  Canes.  Walkers.  Scooters.  Crutches.  Turn on the lights when you go into a dark area. Replace any light bulbs as soon as they burn out.  Set up your furniture so you have a clear path. Avoid moving your furniture around.  If any of your floors are uneven, fix them.  If there are any pets around you, be aware of where they are.  Review your medicines with your doctor. Some medicines can make you feel dizzy. This can increase your chance of falling. Ask your doctor what other things that you can do to help prevent falls. This information is not intended to replace advice given to you by your health care provider. Make sure you discuss any questions you have with your health care provider. Document Released: 07/18/2009 Document Revised: 02/27/2016 Document Reviewed: 10/26/2014 Elsevier Interactive Patient Education  2017 [employer].

## 2019-07-13 ENCOUNTER — Other Ambulatory Visit: Payer: Self-pay | Admitting: Internal Medicine

## 2019-07-25 ENCOUNTER — Other Ambulatory Visit: Payer: Self-pay | Admitting: Internal Medicine

## 2019-07-25 ENCOUNTER — Encounter: Payer: Self-pay | Admitting: Internal Medicine

## 2019-07-27 DIAGNOSIS — R29890 Loss of height: Secondary | ICD-10-CM | POA: Diagnosis not present

## 2019-07-27 DIAGNOSIS — Z1231 Encounter for screening mammogram for malignant neoplasm of breast: Secondary | ICD-10-CM | POA: Diagnosis not present

## 2019-07-27 DIAGNOSIS — M8588 Other specified disorders of bone density and structure, other site: Secondary | ICD-10-CM | POA: Diagnosis not present

## 2019-07-27 DIAGNOSIS — Z780 Asymptomatic menopausal state: Secondary | ICD-10-CM | POA: Diagnosis not present

## 2019-07-27 LAB — HM DEXA SCAN

## 2019-07-27 LAB — HM MAMMOGRAPHY: HM Mammogram: NORMAL (ref 0–4)

## 2019-07-27 IMAGING — US US THYROID
1 series · 13 of 25 positions shown · non-contrast
Comparison: Nuclear medicine sestamibi scan 04/05/2018

CLINICAL DATA: Other. 81-year-old female with hyperparathyroidism
and hypercalcemia. Nuclear medicine sestamibi scan suggested the
possibility of a right inferior parathyroid adenoma.

EXAM:
THYROID ULTRASOUND
TECHNIQUE: Ultrasound examination of the thyroid gland and adjacent soft
tissues was performed.

[Series 1: us thyroid · 0.04mm/px · 55 acquisitions, 13 frames shown]
[im 1/55]
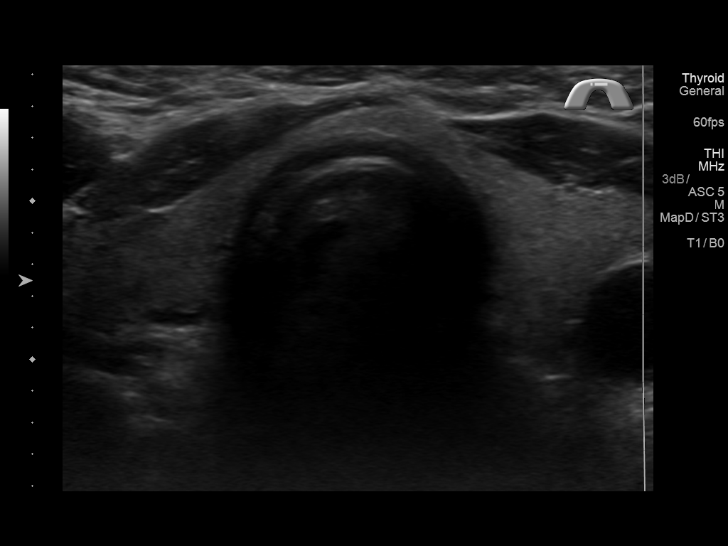
[im 5/55]
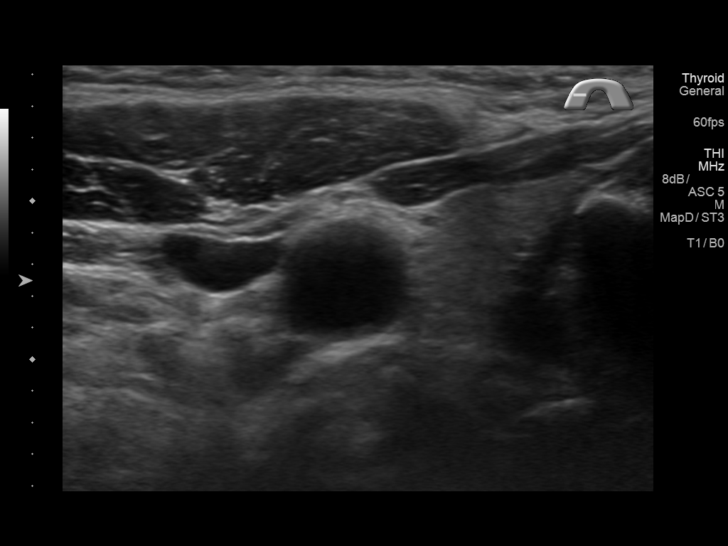
[im 10/55]
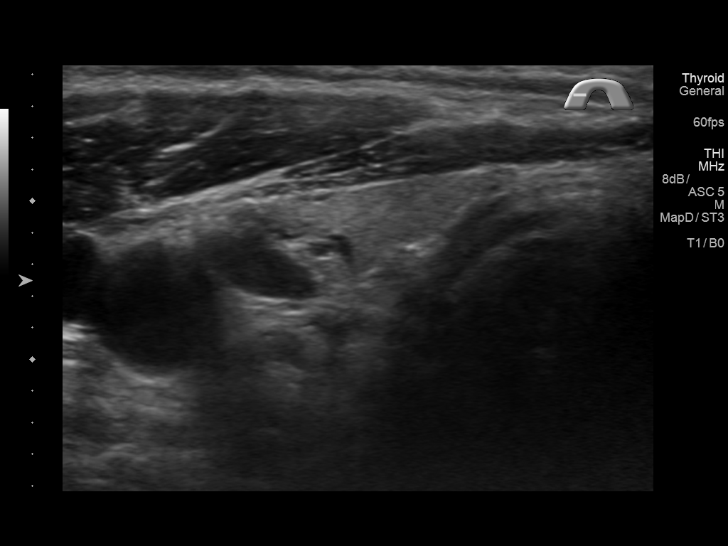
[im 14/55]
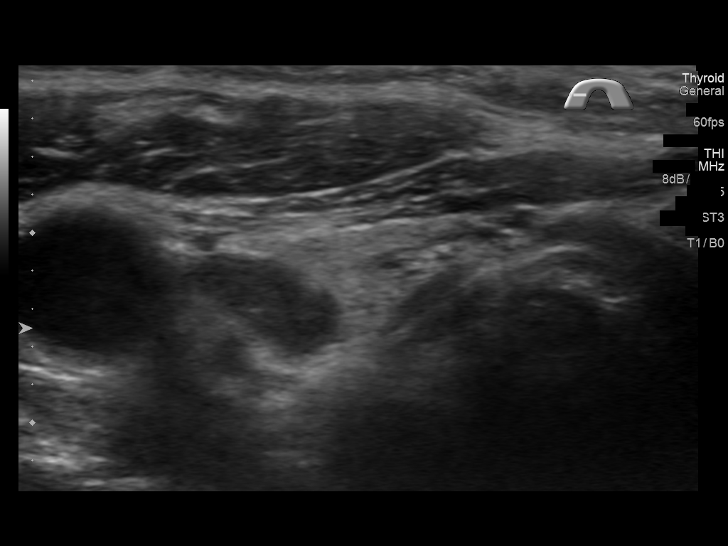
[im 19/55]
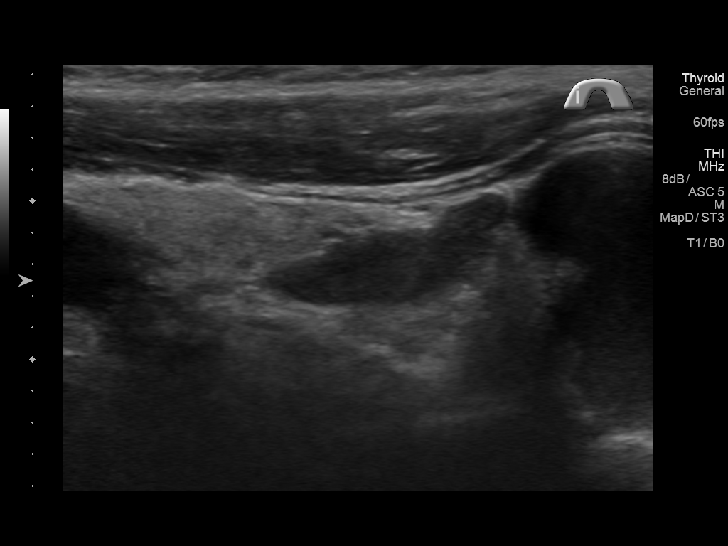
[im 23/55]
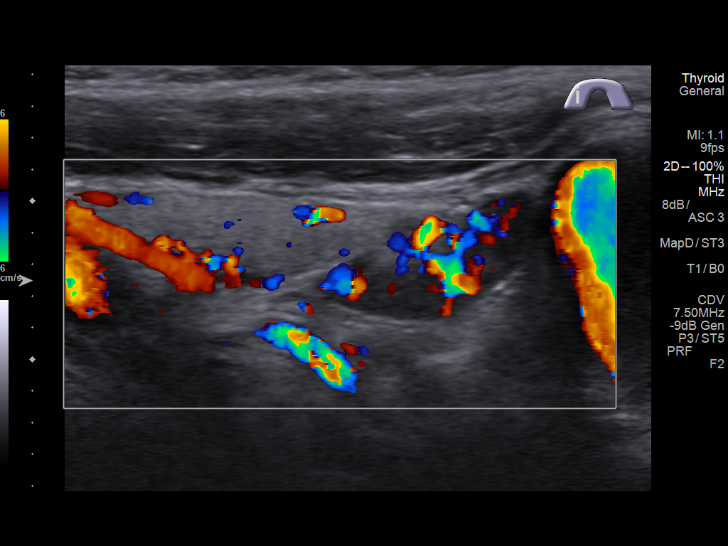
[im 28/55]
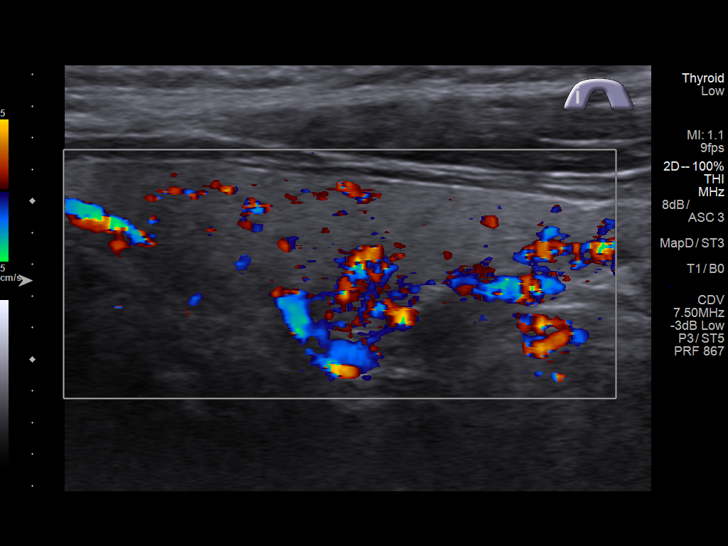
[im 32/55]
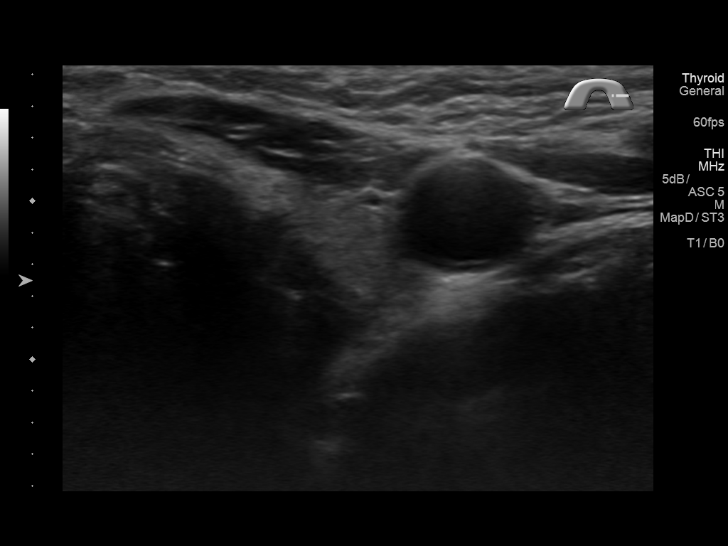
[im 37/55]
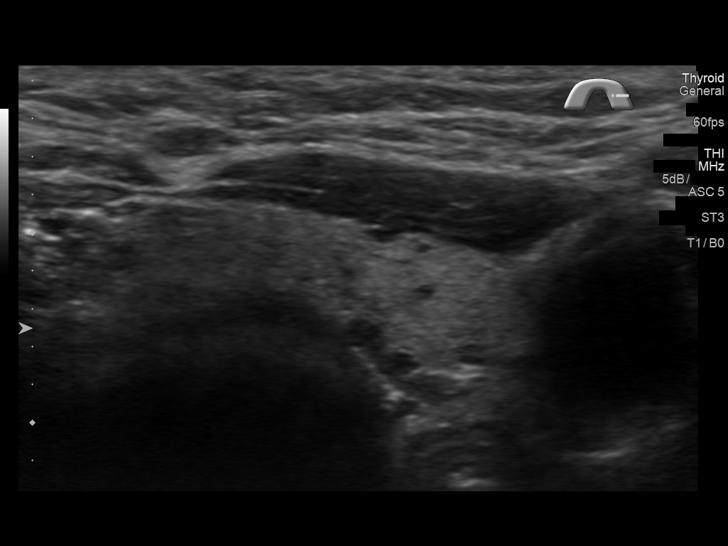
[im 41/55]
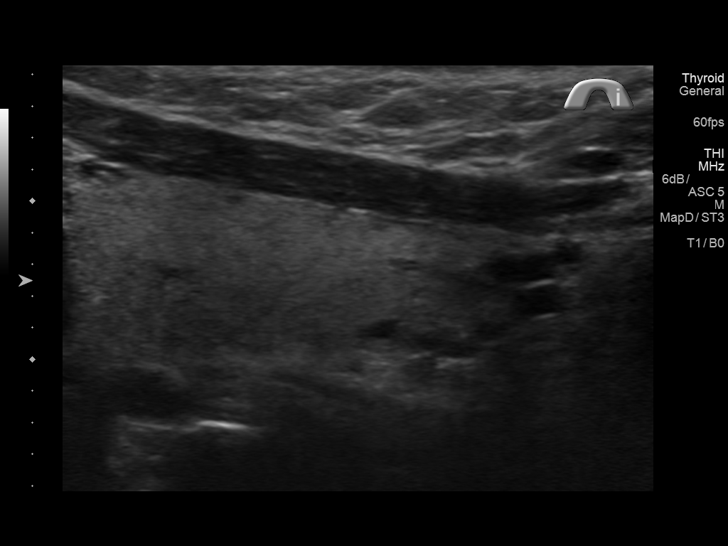
[im 46/55]
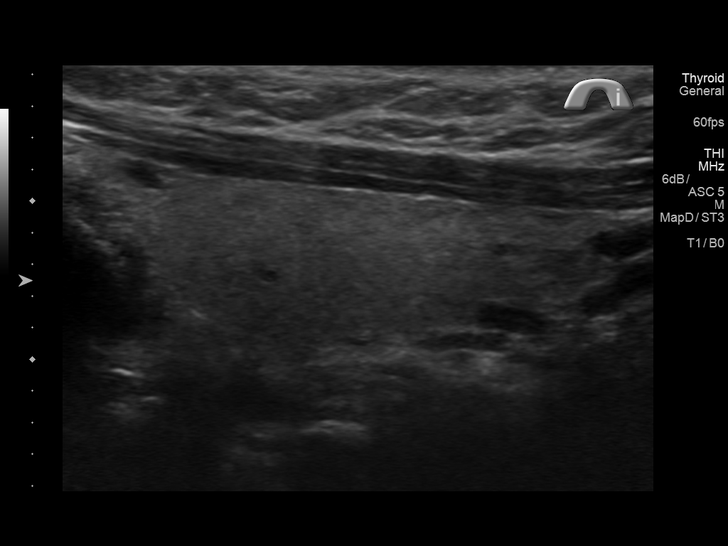
[im 50/55]
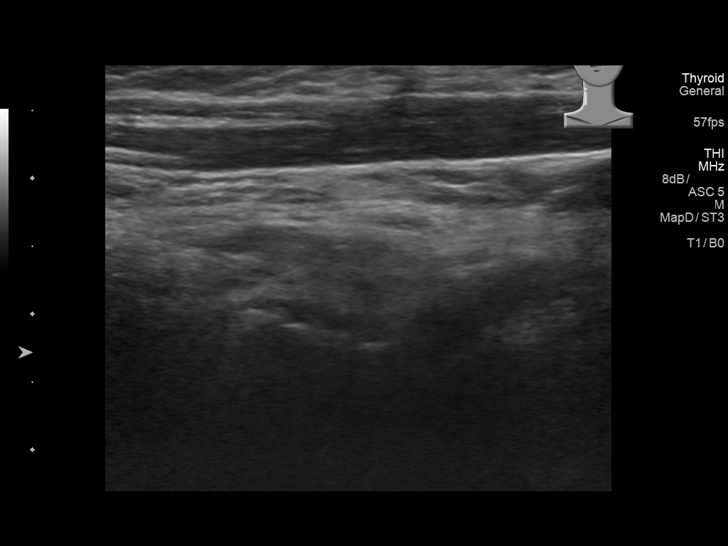
[im 55/55]
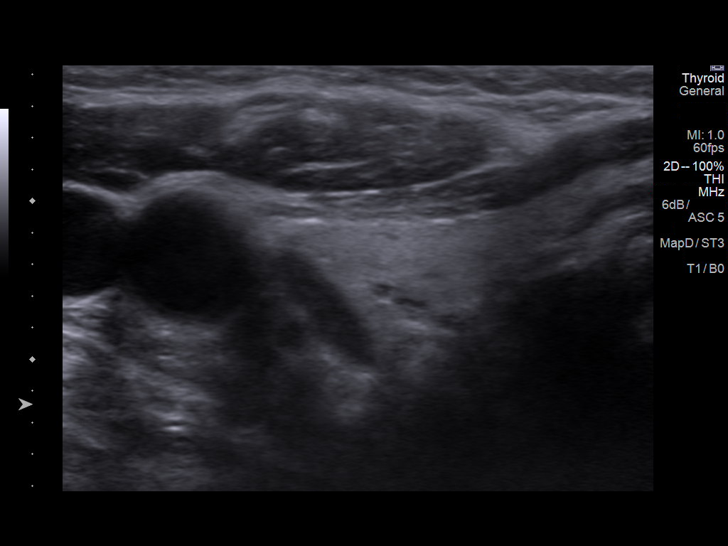

[13 of 25 positions shown; findings below may reference images not displayed]

FINDINGS: Parenchymal Echotexture: Normal

Isthmus: 0.2 cm

Right lobe: 3.5 x 1.0 x 1.6 cm

Left lobe: 3.5 x 0.9 x 1.0 cm

_________________________________________________________

Estimated total number of nodules >/= 1 cm: 0

Number of spongiform nodules >/=  2 cm not described below (TR1): 0

Number of mixed cystic and solid nodules >/= 1.5 cm not described
below (TR2): 0

_________________________________________________________

No discrete nodules are seen within the thyroid gland. Inferior to
the right thyroid gland is a elongated hypoechoic soft tissue nodule
measuring 1.6 x 0.6 x 0.8 cm. There is positive vascularity within
the soft tissue on color Doppler ultrasound. This finding
corresponds with the suspected location of parathyroid adenoma on
the recent prior sestamibi scan.
IMPRESSION: 1. Positive for an elongated hypoechoic soft tissue nodule measuring
1.6 x 0.6 x 0.8 cm inferior to the right thyroid gland most likely
representing a parathyroid adenoma.
2. Normal sonographic appearance of the thyroid gland.

## 2019-08-02 ENCOUNTER — Encounter: Payer: Self-pay | Admitting: Internal Medicine

## 2019-08-14 ENCOUNTER — Encounter: Payer: Self-pay | Admitting: Internal Medicine

## 2019-08-14 ENCOUNTER — Telehealth: Payer: Self-pay

## 2019-08-14 NOTE — Telephone Encounter (Signed)
-----   Message from Glendale Chard, MD sent at 08/13/2019  5:27 PM EST ----- Please contact pt - bone density positive for osteopenia, thinning of the bone. Needs repeat study in 2 years. Should walk 20-30 min three days per week and take calcium/vitamin D supplements.

## 2019-08-14 NOTE — Telephone Encounter (Signed)
Bone density results given

## 2019-08-14 NOTE — Telephone Encounter (Signed)
1st attempt to give bone densitiy results

## 2019-08-16 ENCOUNTER — Ambulatory Visit: Payer: Medicare Other | Admitting: Internal Medicine

## 2019-08-16 ENCOUNTER — Ambulatory Visit: Payer: Medicare Other

## 2019-08-17 ENCOUNTER — Ambulatory Visit: Payer: Medicare Other

## 2019-08-17 ENCOUNTER — Ambulatory Visit: Payer: Medicare Other | Admitting: Internal Medicine

## 2019-08-23 ENCOUNTER — Encounter: Payer: Self-pay | Admitting: Internal Medicine

## 2019-08-23 ENCOUNTER — Other Ambulatory Visit (HOSPITAL_COMMUNITY)
Admission: RE | Admit: 2019-08-23 | Discharge: 2019-08-23 | Disposition: A | Discharge status: Home / Self Care | Payer: Medicare Other | Source: Ambulatory Visit | Attending: Internal Medicine | Admitting: Internal Medicine

## 2019-08-23 ENCOUNTER — Other Ambulatory Visit: Payer: Self-pay

## 2019-08-23 ENCOUNTER — Ambulatory Visit (INDEPENDENT_AMBULATORY_CARE_PROVIDER_SITE_OTHER): Payer: Medicare Other | Admitting: Internal Medicine

## 2019-08-23 VITALS — Temp 98.3°F | Ht 59.9 in | Wt 136.4 lb

## 2019-08-23 DIAGNOSIS — I10 Essential (primary) hypertension: Secondary | ICD-10-CM | POA: Diagnosis not present

## 2019-08-23 DIAGNOSIS — R7309 Other abnormal glucose: Secondary | ICD-10-CM

## 2019-08-23 DIAGNOSIS — H8110 Benign paroxysmal vertigo, unspecified ear: Secondary | ICD-10-CM | POA: Diagnosis not present

## 2019-08-23 DIAGNOSIS — Z79899 Other long term (current) drug therapy: Secondary | ICD-10-CM | POA: Diagnosis not present

## 2019-08-23 DIAGNOSIS — H6123 Impacted cerumen, bilateral: Secondary | ICD-10-CM

## 2019-08-23 DIAGNOSIS — N898 Other specified noninflammatory disorders of vagina: Secondary | ICD-10-CM | POA: Diagnosis not present

## 2019-08-23 DIAGNOSIS — Z6826 Body mass index (BMI) 26.0-26.9, adult: Secondary | ICD-10-CM

## 2019-08-23 DIAGNOSIS — E7800 Pure hypercholesterolemia, unspecified: Secondary | ICD-10-CM

## 2019-08-23 DIAGNOSIS — Z0000 Encounter for general adult medical examination without abnormal findings: Secondary | ICD-10-CM

## 2019-08-23 DIAGNOSIS — M79609 Pain in unspecified limb: Secondary | ICD-10-CM

## 2019-08-23 DIAGNOSIS — E663 Overweight: Secondary | ICD-10-CM

## 2019-08-23 DIAGNOSIS — R202 Paresthesia of skin: Secondary | ICD-10-CM

## 2019-08-23 LAB — POCT URINALYSIS DIPSTICK
Bilirubin, UA: NEGATIVE
Blood, UA: NEGATIVE
Glucose, UA: NEGATIVE
Ketones, UA: NEGATIVE
Leukocytes, UA: NEGATIVE
Nitrite, UA: NEGATIVE
Protein, UA: NEGATIVE
Spec Grav, UA: 1.02 (ref 1.010–1.025)
Urobilinogen, UA: 0.2 E.U./dL
pH, UA: 5.5 (ref 5.0–8.0)

## 2019-08-23 MED ORDER — MECLIZINE HCL 25 MG PO TABS: 25.0000 mg | ORAL_TABLET | Freq: Three times a day (TID) | ORAL | 0 refills | Status: DC | PRN

## 2019-08-23 NOTE — Patient Instructions (Signed)
Calcium - 600mg  once daily Vitamin D3 - 5000 units once daily  Health Maintenance, Female Adopting a healthy lifestyle and getting preventive care are important in promoting health and wellness. Ask your health care provider about:  The right schedule for you to have regular tests and exams.  Things you can do on your own to prevent diseases and keep yourself healthy. What should I know about diet, weight, and exercise? Eat a healthy diet   Eat a diet that includes plenty of vegetables, fruits, low-fat dairy products, and lean protein.  Do not eat a lot of foods that are high in solid fats, added sugars, or sodium. Maintain a healthy weight Body mass index (BMI) is used to identify weight problems. It estimates body fat based on height and weight. Your health care provider can help determine your BMI and help you achieve or maintain a healthy weight. Get regular exercise Get regular exercise. This is one of the most important things you can do for your health. Most adults should:  Exercise for at least 150 minutes each week. The exercise should increase your heart rate and make you sweat (moderate-intensity exercise).  Do strengthening exercises at least twice a week. This is in addition to the moderate-intensity exercise.  Spend less time sitting. Even light physical activity can be beneficial. Watch cholesterol and blood lipids Have your blood tested for lipids and cholesterol at 81 years of age, then have this test every 5 years. Have your cholesterol levels checked more often if:  Your lipid or cholesterol levels are high.  You are older than 81 years of age.  You are at high risk for heart disease. What should I know about cancer screening? Depending on your health history and family history, you may need to have cancer screening at various ages. This may include screening for:  Breast cancer.  Cervical cancer.  Colorectal cancer.  Skin cancer.  Lung cancer. What  should I know about heart disease, diabetes, and high blood pressure? Blood pressure and heart disease  High blood pressure causes heart disease and increases the risk of stroke. This is more likely to develop in people who have high blood pressure readings, are of African descent, or are overweight.  Have your blood pressure checked: ? Every 3-5 years if you are 7-42 years of age. ? Every year if you are 3 years old or older. Diabetes Have regular diabetes screenings. This checks your fasting blood sugar level. Have the screening done:  Once every three years after age 61 if you are at a normal weight and have a low risk for diabetes.  More often and at a younger age if you are overweight or have a high risk for diabetes. What should I know about preventing infection? Hepatitis B If you have a higher risk for hepatitis B, you should be screened for this virus. Talk with your health care provider to find out if you are at risk for hepatitis B infection. Hepatitis C Testing is recommended for:  Everyone born from [birth year] through 1963.  Anyone with known risk factors for hepatitis C. Sexually transmitted infections (STIs)  Get screened for STIs, including gonorrhea and chlamydia, if: ? You are sexually active and are younger than 81 years of age. ? You are older than 81 years of age and your health care provider tells you that you are at risk for this type of infection. ? Your sexual activity has changed since you were last screened, and you are  at increased risk for chlamydia or gonorrhea. Ask your health care provider if you are at risk.  Ask your health care provider about whether you are at high risk for HIV. Your health care provider may recommend a prescription medicine to help prevent HIV infection. If you choose to take medicine to prevent HIV, you should first get tested for HIV. You should then be tested every 3 months for as long as you are taking the medicine. Pregnancy  If  you are about to stop having your period (premenopausal) and you may become pregnant, seek counseling before you get pregnant.  Take 400 to 800 micrograms (mcg) of folic acid every day if you become pregnant.  Ask for birth control (contraception) if you want to prevent pregnancy. Osteoporosis and menopause Osteoporosis is a disease in which the bones lose minerals and strength with aging. This can result in bone fractures. If you are 70 years old or older, or if you are at risk for osteoporosis and fractures, ask your health care provider if you should:  Be screened for bone loss.  Take a calcium or vitamin D supplement to lower your risk of fractures.  Be given hormone replacement therapy (HRT) to treat symptoms of menopause. Follow these instructions at home: Lifestyle  Do not use any products that contain nicotine or tobacco, such as cigarettes, e-cigarettes, and chewing tobacco. If you need help quitting, ask your health care provider.  Do not use street drugs.  Do not share needles.  Ask your health care provider for help if you need support or information about quitting drugs. Alcohol use  Do not drink alcohol if: ? Your health care provider tells you not to drink. ? You are pregnant, may be pregnant, or are planning to become pregnant.  If you drink alcohol: ? Limit how much you use to 0-1 drink a day. ? Limit intake if you are breastfeeding.  Be aware of how much alcohol is in your drink. In the [place] one drink equals one 12 oz bottle of beer (355 mL), one 5 oz glass of wine (148 mL), or one 1 oz glass of hard liquor (44 mL). General instructions  Schedule regular health, dental, and eye exams.  Stay current with your vaccines.  Tell your health care provider if: ? You often feel depressed. ? You have ever been abused or do not feel safe at home. Summary  Adopting a healthy lifestyle and getting preventive care are important in promoting health and wellness.   Follow your health care provider's instructions about healthy diet, exercising, and getting tested or screened for diseases.  Follow your health care provider's instructions on monitoring your cholesterol and blood pressure. This information is not intended to replace advice given to you by your health care provider. Make sure you discuss any questions you have with your health care provider. Document Released: 04/06/2011 Document Revised: 09/14/2018 Document Reviewed: 09/14/2018 Elsevier Patient Education  2020 [employer].

## 2019-08-24 LAB — CMP14+EGFR
ALT: 10 IU/L (ref 0–32)
AST: 16 IU/L (ref 0–40)
Albumin/Globulin Ratio: 2 (ref 1.2–2.2)
Albumin: 4.5 g/dL (ref 3.6–4.6)
Alkaline Phosphatase: 67 IU/L (ref 39–117)
BUN/Creatinine Ratio: 18 (ref 12–28)
BUN: 13 mg/dL (ref 8–27)
Bilirubin Total: 0.5 mg/dL (ref 0.0–1.2)
CO2: 26 mmol/L (ref 20–29)
Calcium: 10.6 mg/dL — ABNORMAL HIGH (ref 8.7–10.3)
Chloride: 101 mmol/L (ref 96–106)
Creatinine, Ser: 0.74 mg/dL (ref 0.57–1.00)
GFR calc Af Amer: 88 mL/min/{1.73_m2} (ref >59)
GFR calc non Af Amer: 76 mL/min/{1.73_m2} (ref >59)
Globulin, Total: 2.2 g/dL (ref 1.5–4.5)
Glucose: 111 mg/dL — ABNORMAL HIGH (ref 65–99)
Potassium: 3.2 mmol/L — ABNORMAL LOW (ref 3.5–5.2)
Sodium: 143 mmol/L (ref 134–144)
Total Protein: 6.7 g/dL (ref 6.0–8.5)

## 2019-08-24 LAB — LIPID PANEL
Chol/HDL Ratio: 2.3 ratio (ref 0.0–4.4)
Cholesterol, Total: 149 mg/dL (ref 100–199)
HDL: 66 mg/dL (ref >39)
LDL Chol Calc (NIH): 68 mg/dL (ref 0–99)
Triglycerides: 80 mg/dL (ref 0–149)
VLDL Cholesterol Cal: 15 mg/dL (ref 5–40)

## 2019-08-24 LAB — CBC
Hematocrit: 36.5 % (ref 34.0–46.6)
Hemoglobin: 12.3 g/dL (ref 11.1–15.9)
MCH: 30.8 pg (ref 26.6–33.0)
MCHC: 33.7 g/dL (ref 31.5–35.7)
MCV: 91 fL (ref 79–97)
Platelets: 243 10*3/uL (ref 150–450)
RBC: 4 x10E6/uL (ref 3.77–5.28)
RDW: 12.5 % (ref 11.7–15.4)
WBC: 5.9 10*3/uL (ref 3.4–10.8)

## 2019-08-24 LAB — HEMOGLOBIN A1C
Est. average glucose Bld gHb Est-mCnc: 126 mg/dL
Hgb A1c MFr Bld: 6 % — ABNORMAL HIGH (ref 4.8–5.6)

## 2019-08-24 LAB — VITAMIN B12: Vitamin B-12: 404 pg/mL (ref 232–1245)

## 2019-08-25 ENCOUNTER — Telehealth: Payer: Self-pay

## 2019-08-25 NOTE — Telephone Encounter (Signed)
-----   Message from Glendale Chard, MD sent at 08/24/2019  7:08 PM EST ----- Your potassium level is slightly low. Please increase your intake of potassium rich foods. If she wants a list, we can mail her a copy. Our calcium level is sl. Elevated. This could be due to sl. Dehydration. Be sure to stay well hydrated.  Your blood count is normal. Your chol is great. Your hba1c is 6.0, this has improved. This is in predm range. Your vitamin B12 level is nl.

## 2019-08-25 NOTE — Progress Notes (Signed)
Subjective:     Patient ID: Erica Meadows , female    DOB: 1935/12/23 , 83 y.o.   MRN: [id number]   Chief Complaint  Patient presents with  . Annual Exam  . Immunizations    pneumonia   . Hypertension    HPI  She is here today for a full physical examination. She is no longer followed by GYN. She wants to know if she needs any vaccines.   Hypertension This is a chronic problem. The current episode started more than 1 year ago. The problem has been gradually improving since onset. The problem is controlled. Risk factors for coronary artery disease include post-menopausal state and sedentary lifestyle. The current treatment provides moderate improvement.     Past Medical History:  Diagnosis Date  . High cholesterol   . Hyperparathyroidism ([place])   . Hypertension    under control; has been on med. x 15-20 yr.  . Seasonal allergies   . Trigger thumb of left hand 12/2012     Family History  Problem Relation Age of Onset  . Hypertension Mother   . Stroke Mother   . Stroke Father   . Hypertension Father      Current Outpatient Medications:  .  amLODipine (NORVASC) 10 MG tablet, TAKE 1 TABLET BY MOUTH EVERY DAY, Disp: 90 tablet, Rfl: 1 .  aspirin 81 MG tablet, Take 81 mg by mouth every evening. , Disp: , Rfl:  .  carvedilol (COREG) 12.5 MG tablet, TAKE 1 TABLET BY MOUTH TWICE A DAY WITH FOOD, Disp: 180 tablet, Rfl: 1 .  cholecalciferol (VITAMIN D) 1000 units tablet, Take 5,000 Units by mouth daily. , Disp: , Rfl:  .  furosemide (LASIX) 20 MG tablet, TAKE 2 ON TUESDAYS AND FRIDAYS, THEN ONE A DAY THE REST OF THE DAYS IN THE WEEK, Disp: 300 tablet, Rfl: 0 .  KLOR-CON M20 20 MEQ tablet, TAKE 1 TABLET BY MOUTH EVERY DAY, Disp: 90 tablet, Rfl: 0 .  losartan-hydrochlorothiazide (HYZAAR) 100-12.5 MG tablet, TAKE 1 TABLET BY MOUTH EVERY DAY, Disp: 90 tablet, Rfl: 1 .  rosuvastatin (CRESTOR) 10 MG tablet, TAKE 1 TABLET BY MOUTH EVERY DAY, Disp: 90 tablet, Rfl: 1 .  meclizine  (ANTIVERT) 25 MG tablet, Take 1 tablet (25 mg total) by mouth 3 (three) times daily as needed for dizziness., Disp: 30 tablet, Rfl: 0   No Known Allergies    The patient states she uses post menopausal status for birth control. Last LMP was No LMP recorded. Patient has had a hysterectomy.. Negative for Dysmenorrhea Negative for: breast discharge, breast lump(s), breast pain and breast self exam. Associated symptoms include abnormal vaginal bleeding. Pertinent negatives include abnormal bleeding (hematology), anxiety, decreased libido, depression, difficulty falling sleep, dyspareunia, history of infertility, nocturia, sexual dysfunction, sleep disturbances, urinary incontinence, urinary urgency, vaginal discharge and vaginal itching. Diet regular.The patient states her exercise level is   minimal.   . The patient's tobacco use is:  Social History   Tobacco Use  Smoking Status Never Smoker  Smokeless Tobacco Never Used  . She has been exposed to passive smoke. The patient's alcohol use is:  Social History   Substance and Sexual Activity  Alcohol Use No    Review of Systems  Constitutional: Negative.   HENT: Negative.   Eyes: Negative.   Respiratory: Negative.   Cardiovascular: Negative.   Endocrine: Negative.   Genitourinary: Negative.   Musculoskeletal: Negative.   Skin: Negative.   Allergic/Immunologic: Negative.   Neurological: Positive  for dizziness.  Hematological: Negative.   Psychiatric/Behavioral: Negative.      Today's Vitals   08/23/19 1442  Temp: 98.3 F (36.8 C)  TempSrc: Oral  Weight: 136 lb 6.4 oz (61.9 kg)  Height: 4' 11.9" (1.521 m)  PainSc: 0-No pain   Body mass index is 26.73 kg/m.   Objective:  Physical Exam Vitals signs and nursing note reviewed.  Constitutional:      Appearance: Normal appearance.  HENT:     Head: Normocephalic and atraumatic.     Right Ear: Ear canal and external ear normal. There is impacted cerumen.     Left Ear: Ear canal  and external ear normal. There is impacted cerumen.     Ears:     Comments: Hard cerumen located b/l ear canals    Nose:     Comments: Deferred,masked.     Mouth/Throat:     Comments: Deferred, masked Eyes:     Extraocular Movements: Extraocular movements intact.     Conjunctiva/sclera: Conjunctivae normal.     Pupils: Pupils are equal, round, and reactive to light.  Neck:     Musculoskeletal: Normal range of motion and neck supple.  Cardiovascular:     Rate and Rhythm: Normal rate and regular rhythm.     Pulses: Normal pulses.     Heart sounds: Normal heart sounds.  Pulmonary:     Effort: Pulmonary effort is normal.     Breath sounds: Normal breath sounds.  Chest:     Breasts: Tanner Score is 5.        Right: Normal.        Left: Normal.  Abdominal:     General: Abdomen is flat. Bowel sounds are normal.     Palpations: Abdomen is soft.  Genitourinary:    Comments: deferred Musculoskeletal: Normal range of motion.  Skin:    General: Skin is warm and dry.  Neurological:     General: No focal deficit present.     Mental Status: She is alert and oriented to person, place, and time.  Psychiatric:        Mood and Affect: Mood normal.        Behavior: Behavior normal.         Assessment And Plan:     1. Routine general medical examination at health care facility  A full exam was performed.  Importance of monthly self breast exams was discussed with the patient. PATIENT HAS BEEN ADVISED TO GET 30-45 MINUTES REGULAR EXERCISE NO LESS THAN FOUR TO FIVE DAYS PER WEEK - BOTH WEIGHTBEARING EXERCISES AND AEROBIC ARE RECOMMENDED.  SHE WAS ADVISED TO FOLLOW A HEALTHY DIET WITH AT LEAST SIX FRUITS/VEGGIES PER DAY, DECREASE INTAKE OF RED MEAT, AND TO INCREASE FISH INTAKE TO TWO DAYS PER WEEK.  MEATS/FISH SHOULD NOT BE FRIED, BAKED OR BROILED IS PREFERABLE.  I SUGGEST WEARING SPF 50 SUNSCREEN ON EXPOSED PARTS AND ESPECIALLY WHEN IN THE DIRECT SUNLIGHT FOR AN EXTENDED PERIOD OF TIME.   PLEASE AVOID FAST FOOD RESTAURANTS AND INCREASE YOUR WATER INTAKE.   2. Essential hypertension  Chronic. Initial BP reading was wnl. Orthostatics also performed due to dizziness.  EKG without new changes. She will rto in six months for re-evaluation. She is encouraged to avoid adding salt to her foods.   - EKG 12-Lead - CMP14+EGFR - CBC - Lipid panel  3. Benign paroxysmal positional vertigo, unspecified laterality  Resolving. Pt advised her sx should improve now that her ears have been flushed successfully.  4. Paresthesia and pain of right extremity  Possibly due to cervical spine disease. She will let me know if her sx persist.  I will check vitamin B12 level.   5. Other abnormal glucose  HER A1C HAS BEEN ELEVATED IN THE PAST. I WILL CHECK AN A1C, BMET TODAY. SHE WAS ENCOURAGED TO AVOID SUGARY BEVERAGES AND PROCESSED FOODS INCLUDNG BREADS, RICE AND PASTA.  - Hemoglobin A1c  6. Bilateral impacted cerumen  AFTER OBTAINING VERBAL CONSENT, BOTH EARS WERE FLUSHED BY IRRIGATION. SHE TOLERATED PROCEDURE WELL WITHOUT ANY COMPLICATIONS. NO TM ABNORMALITIES WERE NOTED.   7. Pure hypercholesterolemia  Chronic. She is encouraged to avoid fried foods when possible and to increase her daily activity.   8. Vaginal discharge  She declined vaginal exam. I will urine for cytology today.   - Urine cytology ancillary only - POCT Urinalysis Dipstick (81002)  9. Overweight with body mass index (BMI) of 26 to 26.9 in adult  She is at a stable weight.   10. Drug therapy  - Vitamin B12        [name], MD    THE PATIENT IS ENCOURAGED TO PRACTICE SOCIAL DISTANCING DUE TO THE COVID-19 PANDEMIC.

## 2019-08-25 NOTE — Telephone Encounter (Signed)
I left the pt a message that I was calling because Dr. Baird Cancer wanted to know how the pt is doing and that I was also calling with her lab results.

## 2019-08-28 ENCOUNTER — Encounter: Payer: Self-pay | Admitting: Internal Medicine

## 2019-08-28 ENCOUNTER — Telehealth: Payer: Self-pay

## 2019-08-28 LAB — URINE CYTOLOGY ANCILLARY ONLY
Bacterial Vaginitis-Urine: NEGATIVE
Candida Urine: NEGATIVE

## 2019-08-28 NOTE — Telephone Encounter (Signed)
Left vm 1st attempt to give results

## 2019-08-28 NOTE — Telephone Encounter (Signed)
-----   Message from Glendale Chard, MD sent at 08/28/2019  1:32 PM EST ----- Urine culture is negative. Is she still having vaginal discharge? No yeast and no bacterial vaginosis in urine.

## 2019-08-29 ENCOUNTER — Telehealth: Payer: Self-pay

## 2019-08-29 NOTE — Telephone Encounter (Signed)
The pt said that she is feeling better, she is taking 5,000 units of vitd 3 daily and that she was told to get calcium at her visit and that she will purchase the 600 mg.

## 2019-08-29 NOTE — Telephone Encounter (Signed)
-----   Message from Glendale Chard, MD sent at 08/25/2019  7:05 PM EST ----- Pls let her know - bone density significant for osteopenia, thinning of the bone. It is important to walk three days weekly and take vitamin D/Calcium supplements. Is she taking either of these? If no, 600mg  calcium and 1000 units of vitamin D3. Be sure to get vit d in capsule form.   We will recheck in two years.   How is she feeling?

## 2019-09-12 ENCOUNTER — Other Ambulatory Visit: Payer: Self-pay | Admitting: Internal Medicine

## 2019-09-13 ENCOUNTER — Telehealth: Payer: Self-pay

## 2019-09-13 NOTE — Telephone Encounter (Signed)
Does she want to see ENT specialist?

## 2019-09-13 NOTE — Telephone Encounter (Signed)
Pt called she finished up the meclizine she was feeling fine until the past 2-3 days ago. The dizziness is back when she looks up and when she gets in bed at night it starts for a couple seconds then goes away. She has not been feeling nausea, she also said its mild compared to the last time. She wants to know what can she do?

## 2019-09-13 NOTE — Telephone Encounter (Signed)
Please check her chart and let me know who she has seen in the past. Check [employer] too just in case. If we do not have this info (may have been sent by previous PCP), then she will have to tell us name of person she has seen.

## 2019-09-13 NOTE — Telephone Encounter (Signed)
She said yes she would like to see them, she has seen them in the past and would like to see the same one she seen in the past.

## 2019-09-14 ENCOUNTER — Other Ambulatory Visit: Payer: Self-pay

## 2019-09-14 DIAGNOSIS — R42 Dizziness and giddiness: Secondary | ICD-10-CM

## 2019-09-23 ENCOUNTER — Other Ambulatory Visit: Payer: Self-pay | Admitting: Internal Medicine

## 2019-09-25 DIAGNOSIS — H903 Sensorineural hearing loss, bilateral: Secondary | ICD-10-CM | POA: Diagnosis not present

## 2019-09-25 DIAGNOSIS — H8111 Benign paroxysmal vertigo, right ear: Secondary | ICD-10-CM | POA: Diagnosis not present

## 2019-09-25 DIAGNOSIS — R42 Dizziness and giddiness: Secondary | ICD-10-CM | POA: Diagnosis not present

## 2019-11-11 ENCOUNTER — Other Ambulatory Visit: Payer: Self-pay | Admitting: Internal Medicine

## 2019-12-05 ENCOUNTER — Other Ambulatory Visit: Payer: Self-pay | Admitting: Internal Medicine

## 2019-12-07 ENCOUNTER — Ambulatory Visit: Payer: Medicare Other | Attending: Internal Medicine

## 2019-12-07 DIAGNOSIS — Z23 Encounter for immunization: Secondary | ICD-10-CM

## 2019-12-07 NOTE — Progress Notes (Signed)
   Covid-19 Vaccination Clinic  Name:  Erica Meadows    MRN: [id number] DOB: 1935-12-26  12/07/2019  Erica Meadows was observed post Covid-19 immunization for 15 minutes without incident. She was provided with Vaccine Information Sheet and instruction to access the V-Safe system.   Erica Meadows was instructed to call 911 with any severe reactions post vaccine: Marland Kitchen Difficulty breathing  . Swelling of face and throat  . A fast heartbeat  . A bad rash all over body  . Dizziness and weakness   Immunizations Administered    Name Date Dose VIS Date Route   Pfizer COVID-19 Vaccine 12/07/2019  1:17 PM 0.3 mL 09/15/2019 Intramuscular   Manufacturer: [place]   Lot: [id number]   [place]: [id number]

## 2019-12-25 ENCOUNTER — Encounter: Payer: Self-pay | Admitting: Internal Medicine

## 2019-12-25 ENCOUNTER — Other Ambulatory Visit: Payer: Self-pay

## 2019-12-25 ENCOUNTER — Ambulatory Visit (INDEPENDENT_AMBULATORY_CARE_PROVIDER_SITE_OTHER): Payer: Medicare Other | Admitting: Internal Medicine

## 2019-12-25 VITALS — BP 130/72 | HR 65 | Temp 98.3°F | Ht 59.9 in | Wt 138.2 lb

## 2019-12-25 DIAGNOSIS — I10 Essential (primary) hypertension: Secondary | ICD-10-CM

## 2019-12-25 DIAGNOSIS — G5711 Meralgia paresthetica, right lower limb: Secondary | ICD-10-CM | POA: Diagnosis not present

## 2019-12-25 DIAGNOSIS — E663 Overweight: Secondary | ICD-10-CM

## 2019-12-25 DIAGNOSIS — Z6827 Body mass index (BMI) 27.0-27.9, adult: Secondary | ICD-10-CM

## 2019-12-25 NOTE — Patient Instructions (Signed)
Meralgia Paresthetica  Consider lidocaine patch *you may get over the counter)

## 2019-12-26 ENCOUNTER — Telehealth: Payer: Self-pay

## 2019-12-26 LAB — BMP8+EGFR
BUN/Creatinine Ratio: 15 (ref 12–28)
BUN: 11 mg/dL (ref 8–27)
CO2: 28 mmol/L (ref 20–29)
Calcium: 10.6 mg/dL — ABNORMAL HIGH (ref 8.7–10.3)
Chloride: 102 mmol/L (ref 96–106)
Creatinine, Ser: 0.75 mg/dL (ref 0.57–1.00)
GFR calc Af Amer: 86 mL/min/{1.73_m2} (ref >59)
GFR calc non Af Amer: 75 mL/min/{1.73_m2} (ref >59)
Glucose: 97 mg/dL (ref 65–99)
Potassium: 3.3 mmol/L — ABNORMAL LOW (ref 3.5–5.2)
Sodium: 144 mmol/L (ref 134–144)

## 2019-12-26 LAB — PTH, INTACT AND CALCIUM: PTH: 45 pg/mL (ref 15–65)

## 2019-12-26 NOTE — Telephone Encounter (Signed)
Left vm for pt to return call for lab results  

## 2019-12-26 NOTE — Telephone Encounter (Signed)
-----   Message from Glendale Chard, MD sent at 12/26/2019  2:14 PM EDT ----- Pls mail copy of labs to patient. PTH hormone is normal (parathyroid). Kidney fxn is stable. Potassium level is slightly low. Please increase intake of potassium rich foods. Please mail her a list of these foods. If she has been taking calcium daily, cut back to MWF dosing.

## 2020-01-01 NOTE — Progress Notes (Signed)
This visit occurred during the SARS-CoV-2 public health emergency.  Safety protocols were in place, including screening questions prior to the visit, additional usage of staff PPE, and extensive cleaning of exam room while observing appropriate contact time as indicated for disinfecting solutions.  Subjective:     Patient ID: Erica Meadows , female    DOB: 1936-02-24 , 84 y.o.   MRN: [id number]   Chief Complaint  Patient presents with  . Hypertension    HPI  Hypertension This is a chronic problem. The current episode started more than 1 year ago. The problem has been gradually improving since onset. The problem is controlled. Pertinent negatives include no blurred vision, chest pain, palpitations or shortness of breath. Risk factors for coronary artery disease include post-menopausal state.     Past Medical History:  Diagnosis Date  . High cholesterol   . Hyperparathyroidism ([place])   . Hypertension    under control; has been on med. x 15-20 yr.  . Seasonal allergies   . Trigger thumb of left hand 12/2012     Family History  Problem Relation Age of Onset  . Hypertension Mother   . Stroke Mother   . Stroke Father   . Hypertension Father      Current Outpatient Medications:  .  amLODipine (NORVASC) 10 MG tablet, TAKE 1 TABLET BY MOUTH EVERY DAY, Disp: 90 tablet, Rfl: 1 .  aspirin 81 MG tablet, Take 81 mg by mouth every evening. , Disp: , Rfl:  .  calcium carbonate (OSCAL) 1500 (600 Ca) MG TABS tablet, Take by mouth 2 (two) times daily with a meal., Disp: , Rfl:  .  carvedilol (COREG) 12.5 MG tablet, TAKE 1 TABLET BY MOUTH TWICE A DAY WITH FOOD, Disp: 180 tablet, Rfl: 1 .  cholecalciferol (VITAMIN D) 1000 units tablet, Take 5,000 Units by mouth daily. , Disp: , Rfl:  .  furosemide (LASIX) 20 MG tablet, TAKE 1 TABLET BY MOUTH EVERY DAY, Disp: 90 tablet, Rfl: 1 .  KLOR-CON M20 20 MEQ tablet, TAKE 1 TABLET BY MOUTH EVERY DAY, Disp: 90 tablet, Rfl: 0 .   losartan-hydrochlorothiazide (HYZAAR) 100-12.5 MG tablet, TAKE 1 TABLET BY MOUTH EVERY DAY, Disp: 90 tablet, Rfl: 1 .  rosuvastatin (CRESTOR) 10 MG tablet, TAKE 1 TABLET BY MOUTH EVERY DAY, Disp: 90 tablet, Rfl: 1   No Known Allergies   Review of Systems  Constitutional: Negative.   Eyes: Negative for blurred vision.  Respiratory: Negative.  Negative for shortness of breath.   Cardiovascular: Negative.  Negative for chest pain and palpitations.  Gastrointestinal: Negative.   Neurological: Positive for numbness.       She c/o burning sensation on lateral side of right thigh. Unable to determine what triggers her sx. Denies LE weakness. Denies back pain.   Psychiatric/Behavioral: Negative.      Today's Vitals   12/25/19 1513  BP: 130/72  Pulse: 65  Temp: 98.3 F (36.8 C)  TempSrc: Oral  SpO2: 94%  Weight: 138 lb 3.2 oz (62.7 kg)  Height: 4' 11.9" (1.521 m)   Body mass index is 27.08 kg/m.   Objective:  Physical Exam Vitals and nursing note reviewed.  Constitutional:      Appearance: Normal appearance.  HENT:     Head: Normocephalic and atraumatic.  Cardiovascular:     Rate and Rhythm: Normal rate and regular rhythm.     Heart sounds: Normal heart sounds.  Pulmonary:     Effort: Pulmonary effort is normal.  Breath sounds: Normal breath sounds.  Skin:    General: Skin is warm.  Neurological:     General: No focal deficit present.     Mental Status: She is alert.  Psychiatric:        Mood and Affect: Mood normal.        Behavior: Behavior normal.         Assessment And Plan:     1. Essential hypertension  Chronic, controlled. She will continue with current meds. She is encouraged to avoid adding salt to her foods. I will check renal function today.   - BMP8+EGFR  2. Meralgia paresthetica of right side  She is advised to use lidocaine patches to affected area daily as needed. Pt advised she can get these OTC.   3. Hypercalcemia  Persistent. I will  check PTH level today.   - Parathyroid Hormone, Intact w/Ca  4. Overweight with body mass index (BMI) of 27 to 27.9 in adult  Her weight is stable for her demographic.   [name], MD    THE PATIENT IS ENCOURAGED TO PRACTICE SOCIAL DISTANCING DUE TO THE COVID-19 PANDEMIC.

## 2020-01-03 ENCOUNTER — Ambulatory Visit: Payer: Medicare Other | Attending: Internal Medicine

## 2020-01-03 DIAGNOSIS — Z23 Encounter for immunization: Secondary | ICD-10-CM

## 2020-01-03 NOTE — Progress Notes (Signed)
   Covid-19 Vaccination Clinic  Name:  Erica Meadows    MRN: [id number] DOB: 06/17/1936  01/03/2020  [name] was observed post Covid-19 immunization for 15 minutes without incident. She was provided with Vaccine Information Sheet and instruction to access the V-Safe system.   [name] was instructed to call 911 with any severe reactions post vaccine: Marland Kitchen Difficulty breathing  . Swelling of face and throat  . A fast heartbeat  . A bad rash all over body  . Dizziness and weakness   Immunizations Administered    Name Date Dose VIS Date Route   Pfizer COVID-19 Vaccine 01/03/2020 12:52 PM 0.3 mL 09/15/2019 Intramuscular   Manufacturer: [place]   Lot: [id number]   [place]: [id number]

## 2020-01-20 ENCOUNTER — Other Ambulatory Visit: Payer: Self-pay | Admitting: Internal Medicine

## 2020-01-30 ENCOUNTER — Other Ambulatory Visit: Payer: Self-pay | Admitting: Internal Medicine

## 2020-04-07 ENCOUNTER — Other Ambulatory Visit: Payer: Self-pay | Admitting: Internal Medicine

## 2020-04-08 ENCOUNTER — Other Ambulatory Visit: Payer: Self-pay | Admitting: Internal Medicine

## 2020-04-11 DIAGNOSIS — R69 Illness, unspecified: Secondary | ICD-10-CM | POA: Diagnosis not present

## 2020-06-19 ENCOUNTER — Other Ambulatory Visit: Payer: Self-pay

## 2020-06-19 ENCOUNTER — Ambulatory Visit (INDEPENDENT_AMBULATORY_CARE_PROVIDER_SITE_OTHER): Payer: Medicare Other

## 2020-06-19 ENCOUNTER — Ambulatory Visit (INDEPENDENT_AMBULATORY_CARE_PROVIDER_SITE_OTHER): Payer: Medicare Other | Admitting: Internal Medicine

## 2020-06-19 ENCOUNTER — Ambulatory Visit: Payer: Medicare Other | Admitting: Internal Medicine

## 2020-06-19 ENCOUNTER — Encounter: Payer: Self-pay | Admitting: Internal Medicine

## 2020-06-19 VITALS — BP 120/66 | HR 64 | Temp 97.9°F | Ht 59.8 in | Wt 136.0 lb

## 2020-06-19 VITALS — BP 120/66 | HR 64 | Temp 97.9°F | Ht 59.8 in | Wt 136.8 lb

## 2020-06-19 DIAGNOSIS — Z0000 Encounter for general adult medical examination without abnormal findings: Secondary | ICD-10-CM | POA: Diagnosis not present

## 2020-06-19 DIAGNOSIS — Z01419 Encounter for gynecological examination (general) (routine) without abnormal findings: Secondary | ICD-10-CM

## 2020-06-19 DIAGNOSIS — Z23 Encounter for immunization: Secondary | ICD-10-CM

## 2020-06-19 DIAGNOSIS — I10 Essential (primary) hypertension: Secondary | ICD-10-CM | POA: Diagnosis not present

## 2020-06-19 DIAGNOSIS — R7309 Other abnormal glucose: Secondary | ICD-10-CM

## 2020-06-19 LAB — POCT UA - MICROALBUMIN
Albumin/Creatinine Ratio, Urine, POC: <30
Creatinine, POC: 100 mg/dL
Microalbumin Ur, POC: 10 mg/L

## 2020-06-19 LAB — POCT URINALYSIS DIPSTICK
Bilirubin, UA: NEGATIVE
Glucose, UA: NEGATIVE
Ketones, UA: NEGATIVE
Leukocytes, UA: NEGATIVE
Nitrite, UA: NEGATIVE
Protein, UA: NEGATIVE
Spec Grav, UA: 1.025 (ref 1.010–1.025)
Urobilinogen, UA: 1 E.U./dL
pH, UA: 6 (ref 5.0–8.0)

## 2020-06-19 LAB — HEMOCCULT GUIAC POC 1CARD (OFFICE): Fecal Occult Blood, POC: NEGATIVE

## 2020-06-19 MED ORDER — PREVNAR 13 IM SUSP: 0.5000 mL | INTRAMUSCULAR | 0 refills | Status: AC

## 2020-06-19 NOTE — Patient Instructions (Addendum)
[employer]  Health Maintenance, Female Adopting a healthy lifestyle and getting preventive care are important in promoting health and wellness. Ask your health care provider about:  The right schedule for you to have regular tests and exams.  Things you can do on your own to prevent diseases and keep yourself healthy. What should I know about diet, weight, and exercise? Eat a healthy diet   Eat a diet that includes plenty of vegetables, fruits, low-fat dairy products, and lean protein.  Do not eat a lot of foods that are high in solid fats, added sugars, or sodium. Maintain a healthy weight Body mass index (BMI) is used to identify weight problems. It estimates body fat based on height and weight. Your health care provider can help determine your BMI and help you achieve or maintain a healthy weight. Get regular exercise Get regular exercise. This is one of the most important things you can do for your health. Most adults should:  Exercise for at least 150 minutes each week. The exercise should increase your heart rate and make you sweat (moderate-intensity exercise).  Do strengthening exercises at least twice a week. This is in addition to the moderate-intensity exercise.  Spend less time sitting. Even light physical activity can be beneficial. Watch cholesterol and blood lipids Have your blood tested for lipids and cholesterol at 82 years of age, then have this test every 5 years. Have your cholesterol levels checked more often if:  Your lipid or cholesterol levels are high.  You are older than 82 years of age.  You are at high risk for heart disease. What should I know about cancer screening? Depending on your health history and family history, you may need to have cancer screening at various ages. This may include screening for:  Breast cancer.  Cervical cancer.  Colorectal cancer.  Skin cancer.  Lung cancer. What should I know about heart disease,  diabetes, and high blood pressure? Blood pressure and heart disease  High blood pressure causes heart disease and increases the risk of stroke. This is more likely to develop in people who have high blood pressure readings, are of African descent, or are overweight.  Have your blood pressure checked: ? Every 3-5 years if you are 53-87 years of age. ? Every year if you are 47 years old or older. Diabetes Have regular diabetes screenings. This checks your fasting blood sugar level. Have the screening done:  Once every three years after age 29 if you are at a normal weight and have a low risk for diabetes.  More often and at a younger age if you are overweight or have a high risk for diabetes. What should I know about preventing infection? Hepatitis B If you have a higher risk for hepatitis B, you should be screened for this virus. Talk with your health care provider to find out if you are at risk for hepatitis B infection. Hepatitis C Testing is recommended for:  Everyone born from [birth year] through 1963.  Anyone with known risk factors for hepatitis C. Sexually transmitted infections (STIs)  Get screened for STIs, including gonorrhea and chlamydia, if: ? You are sexually active and are younger than 82 years of age. ? You are older than 82 years of age and your health care provider tells you that you are at risk for this type of infection. ? Your sexual activity has changed since you were last screened, and you are at increased risk for chlamydia or gonorrhea. Ask your  health care provider if you are at risk.  Ask your health care provider about whether you are at high risk for HIV. Your health care provider may recommend a prescription medicine to help prevent HIV infection. If you choose to take medicine to prevent HIV, you should first get tested for HIV. You should then be tested every 3 months for as long as you are taking the medicine. Pregnancy  If you are about to stop having your  period (premenopausal) and you may become pregnant, seek counseling before you get pregnant.  Take 400 to 800 micrograms (mcg) of folic acid every day if you become pregnant.  Ask for birth control (contraception) if you want to prevent pregnancy. Osteoporosis and menopause Osteoporosis is a disease in which the bones lose minerals and strength with aging. This can result in bone fractures. If you are 22 years old or older, or if you are at risk for osteoporosis and fractures, ask your health care provider if you should:  Be screened for bone loss.  Take a calcium or vitamin D supplement to lower your risk of fractures.  Be given hormone replacement therapy (HRT) to treat symptoms of menopause. Follow these instructions at home: Lifestyle  Do not use any products that contain nicotine or tobacco, such as cigarettes, e-cigarettes, and chewing tobacco. If you need help quitting, ask your health care provider.  Do not use street drugs.  Do not share needles.  Ask your health care provider for help if you need support or information about quitting drugs. Alcohol use  Do not drink alcohol if: ? Your health care provider tells you not to drink. ? You are pregnant, may be pregnant, or are planning to become pregnant.  If you drink alcohol: ? Limit how much you use to 0-1 drink a day. ? Limit intake if you are breastfeeding.  Be aware of how much alcohol is in your drink. In the [place] one drink equals one 12 oz bottle of beer (355 mL), one 5 oz glass of wine (148 mL), or one 1 oz glass of hard liquor (44 mL). General instructions  Schedule regular health, dental, and eye exams.  Stay current with your vaccines.  Tell your health care provider if: ? You often feel depressed. ? You have ever been abused or do not feel safe at home. Summary  Adopting a healthy lifestyle and getting preventive care are important in promoting health and wellness.  Follow your health care provider's  instructions about healthy diet, exercising, and getting tested or screened for diseases.  Follow your health care provider's instructions on monitoring your cholesterol and blood pressure. This information is not intended to replace advice given to you by your health care provider. Make sure you discuss any questions you have with your health care provider. Document Revised: 09/14/2018 Document Reviewed: 09/14/2018 Elsevier Patient Education  2020 [employer].

## 2020-06-19 NOTE — Progress Notes (Signed)
I,Katawbba Wiggins,acting as a Education administrator for [name], MD.,have documented all relevant documentation on the behalf of [name], MD,as directed by  [name], MD while in the presence of [name], MD.  This visit occurred during the SARS-CoV-2 public health emergency.  Safety protocols were in place, including screening questions prior to the visit, additional usage of staff PPE, and extensive cleaning of exam room while observing appropriate contact time as indicated for disinfecting solutions.  Subjective:     Patient ID: Erica Meadows , female    DOB: Mar 01, 1936 , 84 y.o.   MRN: [id number]   Chief Complaint  Patient presents with  . Annual Exam  . Hypertension    HPI  She is here today for a full physical examination. She is no longer followed by GYN, Dr. Garwin Brothers. However, she wants to have vaginal exam because she is "black down there". Admits to having vaginal discharge two weeks ago - clear, watery. No vaginal itching. She is not sexually active.   Hypertension This is a chronic problem. The current episode started more than 1 year ago. The problem has been gradually improving since onset. The problem is controlled. Risk factors for coronary artery disease include post-menopausal state and sedentary lifestyle. The current treatment provides moderate improvement.     Past Medical History:  Diagnosis Date  . High cholesterol   . Hyperparathyroidism ([place])   . Hypertension    under control; has been on med. x 15-20 yr.  . Seasonal allergies   . Trigger thumb of left hand 12/2012     Family History  Problem Relation Age of Onset  . Hypertension Mother   . Stroke Mother   . Stroke Father   . Hypertension Father      Current Outpatient Medications:  .  acetaminophen (TYLENOL) 500 MG tablet, Take 500 mg by mouth every 6 (six) hours as needed. prn, Disp: , Rfl:  .  amLODipine (NORVASC) 10 MG tablet, TAKE 1 TABLET BY MOUTH EVERY DAY, Disp: 90 tablet,  Rfl: 1 .  aspirin 81 MG tablet, Take 81 mg by mouth every evening. , Disp: , Rfl:  .  calcium carbonate (OSCAL) 1500 (600 Ca) MG TABS tablet, Take by mouth 2 (two) times daily with a meal., Disp: , Rfl:  .  carvedilol (COREG) 12.5 MG tablet, TAKE 1 TABLET BY MOUTH TWICE A DAY WITH FOOD, Disp: 180 tablet, Rfl: 1 .  cholecalciferol (VITAMIN D) 1000 units tablet, Take 5,000 Units by mouth daily. , Disp: , Rfl:  .  furosemide (LASIX) 20 MG tablet, TAKE 1 TABLET BY MOUTH EVERY DAY, Disp: 90 tablet, Rfl: 1 .  losartan-hydrochlorothiazide (HYZAAR) 100-12.5 MG tablet, TAKE 1 TABLET BY MOUTH EVERY DAY, Disp: 90 tablet, Rfl: 1 .  rosuvastatin (CRESTOR) 10 MG tablet, TAKE 1 TABLET BY MOUTH EVERY DAY, Disp: 90 tablet, Rfl: 1 .  KLOR-CON M20 20 MEQ tablet, TAKE 1 TABLET BY MOUTH EVERY DAY, Disp: 90 tablet, Rfl: 1   No Known Allergies    The patient states she uses post menopausal status for birth control. Last LMP was No LMP recorded. Patient has had a hysterectomy.. Negative for Dysmenorrhea. Negative for: breast discharge, breast lump(s), breast pain and breast self exam. Associated symptoms include abnormal vaginal bleeding. Pertinent negatives include abnormal bleeding (hematology), anxiety, decreased libido, depression, difficulty falling sleep, dyspareunia, history of infertility, nocturia, sexual dysfunction, sleep disturbances, urinary incontinence, urinary urgency, vaginal discharge and vaginal itching. Diet regular.The patient  states her exercise level is  intermittent.  . The patient's tobacco use is:  Social History   Tobacco Use  Smoking Status Never Smoker  Smokeless Tobacco Never Used  . She has been exposed to passive smoke. The patient's alcohol use is:  Social History   Substance and Sexual Activity  Alcohol Use No    Review of Systems  Constitutional: Negative.   HENT: Negative.   Eyes: Negative.   Respiratory: Negative.   Cardiovascular: Negative.   Gastrointestinal: Negative.    Endocrine: Negative.   Genitourinary: Positive for vaginal discharge.  Musculoskeletal: Negative.   Skin: Negative.   Allergic/Immunologic: Negative.   Neurological: Negative.   Hematological: Negative.   Psychiatric/Behavioral: Negative.      Today's Vitals   06/19/20 1516  BP: 120/66  Pulse: 64  Temp: 97.9 F (36.6 C)  TempSrc: Oral  Weight: 136 lb 12.8 oz (62.1 kg)  Height: 4' 11.8" (1.519 m)  PainSc: 4   PainLoc: Hand   Body mass index is 26.9 kg/m.  Wt Readings from Last 3 Encounters:  06/19/20 136 lb (61.7 kg)  06/19/20 136 lb 12.8 oz (62.1 kg)  12/25/19 138 lb 3.2 oz (62.7 kg)   Objective:  Physical Exam Exam conducted with a chaperone present.  Constitutional:      General: She is not in acute distress.    Appearance: Normal appearance. She is well-developed.  HENT:     Head: Normocephalic and atraumatic.     Right Ear: Hearing, tympanic membrane, ear canal and external ear normal. There is no impacted cerumen.     Left Ear: Hearing, tympanic membrane, ear canal and external ear normal. There is no impacted cerumen.     Nose: Nose normal.     Mouth/Throat:     Mouth: Mucous membranes are dry.  Eyes:     General: Lids are normal.     Extraocular Movements: Extraocular movements intact.     Conjunctiva/sclera: Conjunctivae normal.     Pupils: Pupils are equal, round, and reactive to light.     Funduscopic exam:    Right eye: No papilledema.        Left eye: No papilledema.  Neck:     Thyroid: No thyroid mass.     Vascular: No carotid bruit.  Cardiovascular:     Rate and Rhythm: Normal rate and regular rhythm.     Pulses: Normal pulses.     Heart sounds: Normal heart sounds. No murmur heard.   Pulmonary:     Effort: Pulmonary effort is normal.     Breath sounds: Normal breath sounds.  Chest:     Breasts: Tanner Score is 5.        Right: Normal.        Left: Normal.  Abdominal:     General: Abdomen is flat. Bowel sounds are normal. There is no  distension.     Palpations: Abdomen is soft.     Tenderness: There is no abdominal tenderness.  Genitourinary:    Exam position: Lithotomy position.     Uterus: Absent.      Rectum: Normal. Guaiac result negative.     Comments: Hyperpigmented vulva v/l, no vesicular lesions noted, no erythema. Atrophic vaginal mucosa Musculoskeletal:        General: No swelling. Normal range of motion.     Cervical back: Full passive range of motion without pain, normal range of motion and neck supple.     Right lower leg: No edema.  Left lower leg: No edema.  Skin:    General: Skin is warm and dry.     Capillary Refill: Capillary refill takes less than 2 seconds.  Neurological:     General: No focal deficit present.     Mental Status: She is alert and oriented to person, place, and time.     Cranial Nerves: No cranial nerve deficit.     Sensory: No sensory deficit.  Psychiatric:        Mood and Affect: Mood normal.        Behavior: Behavior normal.        Thought Content: Thought content normal.        Judgment: Judgment normal.         Assessment And Plan:     1. Routine general medical examination at health care facility Comments: A full exam was performed. Pelvic exam performed.  Stool is heme negative. Importance of monthly self breast exams was discussed with the patient. PATIENT IS ADVISED TO GET 30-45 MINUTES REGULAR EXERCISE NO LESS THAN FOUR TO FIVE DAYS PER WEEK - BOTH WEIGHTBEARING EXERCISES AND AEROBIC ARE RECOMMENDED.  PATIENT IS ADVISED TO FOLLOW A HEALTHY DIET WITH AT LEAST SIX FRUITS/VEGGIES PER DAY, DECREASE INTAKE OF RED MEAT, AND TO INCREASE FISH INTAKE TO TWO DAYS PER WEEK.  MEATS/FISH SHOULD NOT BE FRIED, BAKED OR BROILED IS PREFERABLE.  I SUGGEST WEARING SPF 50 SUNSCREEN ON EXPOSED PARTS AND ESPECIALLY WHEN IN THE DIRECT SUNLIGHT FOR AN EXTENDED PERIOD OF TIME.  PLEASE AVOID FAST FOOD RESTAURANTS AND INCREASE YOUR WATER INTAKE.  - POCT occult blood stool  2. Encounter  for gynecological examination Comments: GYN exam was performed. Stool is heme negative .  3. Essential hypertension Comments: Chronic, well controlled. She will continue with current meds. EKG performed, NSR w/o acute changes. She will rto in six months for re-evaluation. - POCT Urinalysis Dipstick (81002) - POCT UA - Microalbumin - EKG 12-Lead - CMP14+EGFR - CBC - Lipid panel  4. Other abnormal glucose Comments: Her a1c has been elevated in the past. I will check an a1c today. Encouraged to limit her intake of sugary beverages.  - Hemoglobin A1c  5. Need for vaccination Comments: She was given high dose flu vaccine to update her immunization history.  - Flu Vaccine QUAD High Dose(Fluad)     Patient was given opportunity to ask questions. Patient verbalized understanding of the plan and was able to repeat key elements of the plan. All questions were answered to their satisfaction.   [name], MD   I, [name], MD, have reviewed all documentation for this visit. The documentation on 07/02/20 for the exam, diagnosis, procedures, and orders are all accurate and complete.  THE PATIENT IS ENCOURAGED TO PRACTICE SOCIAL DISTANCING DUE TO THE COVID-19 PANDEMIC.

## 2020-06-19 NOTE — Patient Instructions (Signed)
Erica Meadows , Thank you for taking time to come for your Medicare Wellness Visit. I appreciate your ongoing commitment to your health goals. Please review the following plan we discussed and let me know if I can assist you in the future.   Screening recommendations/referrals: Colonoscopy: not required Mammogram: completed 07/27/2019 Bone Density: completed 07/27/2019 Recommended yearly ophthalmology/optometry visit for glaucoma screening and checkup Recommended yearly dental visit for hygiene and checkup  Vaccinations: Influenza vaccine: today Pneumococcal vaccine: sent to pharmacy Tdap vaccine: completed 03/03/2018 Shingles vaccine: discussed   Covid-19: 01/03/2020, 12/07/2019  Advanced directives: Advance directive discussed with you today. Even though you declined this today please call our office should you change your mind and we can give you the proper paperwork for you to fill out.   Conditions/risks identified: none  Next appointment: Follow up in one year for your annual wellness visit 06/26/2021 at 2:00   Preventive Care 65 Years and Older, Female Preventive care refers to lifestyle choices and visits with your health care provider that can promote health and wellness. What does preventive care include?  A yearly physical exam. This is also called an annual well check.  Dental exams once or twice a year.  Routine eye exams. Ask your health care provider how often you should have your eyes checked.  Personal lifestyle choices, including:  Daily care of your teeth and gums.  Regular physical activity.  Eating a healthy diet.  Avoiding tobacco and drug use.  Limiting alcohol use.  Practicing safe sex.  Taking low-dose aspirin every day.  Taking vitamin and mineral supplements as recommended by your health care provider. What happens during an annual well check? The services and screenings done by your health care provider during your annual well check will depend  on your age, overall health, lifestyle risk factors, and family history of disease. Counseling  Your health care provider may ask you questions about your:  Alcohol use.  Tobacco use.  Drug use.  Emotional well-being.  Home and relationship well-being.  Sexual activity.  Eating habits.  History of falls.  Memory and ability to understand (cognition).  Work and work Statistician.  Reproductive health. Screening  You may have the following tests or measurements:  Height, weight, and BMI.  Blood pressure.  Lipid and cholesterol levels. These may be checked every 5 years, or more frequently if you are over 75 years old.  Skin check.  Lung cancer screening. You may have this screening every year starting at age 26 if you have a 30-pack-year history of smoking and currently smoke or have quit within the past 15 years.  Fecal occult blood test (FOBT) of the stool. You may have this test every year starting at age 24.  Flexible sigmoidoscopy or colonoscopy. You may have a sigmoidoscopy every 5 years or a colonoscopy every 10 years starting at age 37.  Hepatitis C blood test.  Hepatitis B blood test.  Sexually transmitted disease (STD) testing.  Diabetes screening. This is done by checking your blood sugar (glucose) after you have not eaten for a while (fasting). You may have this done every 1-3 years.  Bone density scan. This is done to screen for osteoporosis. You may have this done starting at age 96.  Mammogram. This may be done every 1-2 years. Talk to your health care provider about how often you should have regular mammograms. Talk with your health care provider about your test results, treatment options, and if necessary, the need for more tests.  Vaccines  Your health care provider may recommend certain vaccines, such as:  Influenza vaccine. This is recommended every year.  Tetanus, diphtheria, and acellular pertussis (Tdap, Td) vaccine. You may need a Td  booster every 10 years.  Zoster vaccine. You may need this after age 23.  Pneumococcal 13-valent conjugate (PCV13) vaccine. One dose is recommended after age 35.  Pneumococcal polysaccharide (PPSV23) vaccine. One dose is recommended after age 87. Talk to your health care provider about which screenings and vaccines you need and how often you need them. This information is not intended to replace advice given to you by your health care provider. Make sure you discuss any questions you have with your health care provider. Document Released: 10/18/2015 Document Revised: 06/10/2016 Document Reviewed: 07/23/2015 Elsevier Interactive Patient Education  2017 [employer] Prevention in the Home Falls can cause injuries. They can happen to people of all ages. There are many things you can do to make your home safe and to help prevent falls. What can I do on the outside of my home?  Regularly fix the edges of walkways and driveways and fix any cracks.  Remove anything that might make you trip as you walk through a door, such as a raised step or threshold.  Trim any bushes or trees on the path to your home.  Use bright outdoor lighting.  Clear any walking paths of anything that might make someone trip, such as rocks or tools.  Regularly check to see if handrails are loose or broken. Make sure that both sides of any steps have handrails.  Any raised decks and porches should have guardrails on the edges.  Have any leaves, snow, or ice cleared regularly.  Use sand or salt on walking paths during winter.  Clean up any spills in your garage right away. This includes oil or grease spills. What can I do in the bathroom?  Use night lights.  Install grab bars by the toilet and in the tub and shower. Do not use towel bars as grab bars.  Use non-skid mats or decals in the tub or shower.  If you need to sit down in the shower, use a plastic, non-slip stool.  Keep the floor dry. Clean up  any water that spills on the floor as soon as it happens.  Remove soap buildup in the tub or shower regularly.  Attach bath mats securely with double-sided non-slip rug tape.  Do not have throw rugs and other things on the floor that can make you trip. What can I do in the bedroom?  Use night lights.  Make sure that you have a light by your bed that is easy to reach.  Do not use any sheets or blankets that are too big for your bed. They should not hang down onto the floor.  Have a firm chair that has side arms. You can use this for support while you get dressed.  Do not have throw rugs and other things on the floor that can make you trip. What can I do in the kitchen?  Clean up any spills right away.  Avoid walking on wet floors.  Keep items that you use a lot in easy-to-reach places.  If you need to reach something above you, use a strong step stool that has a grab bar.  Keep electrical cords out of the way.  Do not use floor polish or wax that makes floors slippery. If you must use wax, use non-skid floor wax.  Do not have throw rugs and other things on the floor that can make you trip. What can I do with my stairs?  Do not leave any items on the stairs.  Make sure that there are handrails on both sides of the stairs and use them. Fix handrails that are broken or loose. Make sure that handrails are as long as the stairways.  Check any carpeting to make sure that it is firmly attached to the stairs. Fix any carpet that is loose or worn.  Avoid having throw rugs at the top or bottom of the stairs. If you do have throw rugs, attach them to the floor with carpet tape.  Make sure that you have a light switch at the top of the stairs and the bottom of the stairs. If you do not have them, ask someone to add them for you. What else can I do to help prevent falls?  Wear shoes that:  Do not have high heels.  Have rubber bottoms.  Are comfortable and fit you well.  Are  closed at the toe. Do not wear sandals.  If you use a stepladder:  Make sure that it is fully opened. Do not climb a closed stepladder.  Make sure that both sides of the stepladder are locked into place.  Ask someone to hold it for you, if possible.  Clearly mark and make sure that you can see:  Any grab bars or handrails.  First and last steps.  Where the edge of each step is.  Use tools that help you move around (mobility aids) if they are needed. These include:  Canes.  Walkers.  Scooters.  Crutches.  Turn on the lights when you go into a dark area. Replace any light bulbs as soon as they burn out.  Set up your furniture so you have a clear path. Avoid moving your furniture around.  If any of your floors are uneven, fix them.  If there are any pets around you, be aware of where they are.  Review your medicines with your doctor. Some medicines can make you feel dizzy. This can increase your chance of falling. Ask your doctor what other things that you can do to help prevent falls. This information is not intended to replace advice given to you by your health care provider. Make sure you discuss any questions you have with your health care provider. Document Released: 07/18/2009 Document Revised: 02/27/2016 Document Reviewed: 10/26/2014 Elsevier Interactive Patient Education  2017 [employer].

## 2020-06-19 NOTE — Progress Notes (Signed)
This visit occurred during the SARS-CoV-2 public health emergency.  Safety protocols were in place, including screening questions prior to the visit, additional usage of staff PPE, and extensive cleaning of exam room while observing appropriate contact time as indicated for disinfecting solutions.  Subjective:   Kayly AALIYA MAULTSBY is a 84 y.o. female who presents for Medicare Annual (Subsequent) preventive examination.  Review of Systems     Cardiac Risk Factors include: advanced age (>77men, >38 women);dyslipidemia;hypertension;sedentary lifestyle     Objective:    Today's Vitals   06/19/20 1556 06/19/20 1602  BP: 120/66   Pulse: 64   Temp: 97.9 F (36.6 C)   TempSrc: Oral   Weight: 136 lb (61.7 kg)   Height: 4' 11.8" (1.519 m)   PainSc:  4    Body mass index is 26.74 kg/m.  Advanced Directives 06/19/2020 06/14/2019 08/11/2018 06/16/2018 06/13/2018 12/08/2012  Does Patient Have a Medical Advance Directive? No No No No No Patient does not have advance directive;Patient would like information  Would patient like information on creating a medical advance directive? No - Patient declined - No - Patient declined No - Patient declined Yes (MAU/Ambulatory/Procedural Areas - Information given) Advance directive packet given    Current Medications (verified) Outpatient Encounter Medications as of 06/19/2020  Medication Sig  . amLODipine (NORVASC) 10 MG tablet TAKE 1 TABLET BY MOUTH EVERY DAY  . aspirin 81 MG tablet Take 81 mg by mouth every evening.   . calcium carbonate (OSCAL) 1500 (600 Ca) MG TABS tablet Take by mouth 2 (two) times daily with a meal.  . carvedilol (COREG) 12.5 MG tablet TAKE 1 TABLET BY MOUTH TWICE A DAY WITH FOOD  . cholecalciferol (VITAMIN D) 1000 units tablet Take 5,000 Units by mouth daily.   . furosemide (LASIX) 20 MG tablet TAKE 1 TABLET BY MOUTH EVERY DAY  . KLOR-CON M20 20 MEQ tablet TAKE 1 TABLET BY MOUTH EVERY DAY  . losartan-hydrochlorothiazide (HYZAAR) 100-12.5  MG tablet TAKE 1 TABLET BY MOUTH EVERY DAY  . pneumococcal 13-valent conjugate vaccine (PREVNAR 13) SUSP injection Inject 0.5 mLs into the muscle tomorrow at 10 am for 1 dose.  . rosuvastatin (CRESTOR) 10 MG tablet TAKE 1 TABLET BY MOUTH EVERY DAY   No facility-administered encounter medications on file as of 06/19/2020.    Allergies (verified) Patient has no known allergies.   History: Past Medical History:  Diagnosis Date  . High cholesterol   . Hyperparathyroidism ([place])   . Hypertension    under control; has been on med. x 15-20 yr.  . Seasonal allergies   . Trigger thumb of left hand 12/2012   Past Surgical History:  Procedure Laterality Date  . ABDOMINAL HYSTERECTOMY  1975   partial  . HAND  SURGERY  Left 03/2018   ANTERIOR  HAND ;  CUT INTO TENDON ON HAND WHILE CHOPPING CABBAGE ;     . hysterectomy partial    . PARATHYROIDECTOMY Right 06/16/2018   Procedure: RIGHT INFERIOR PARATHYROIDECTOMY;  Surgeon: [name] Gemma, MD;  Location: [facility];  Service: General;  Laterality: Right;  . TRIGGER FINGER RELEASE Right   . TRIGGER FINGER RELEASE Left 12/12/2012   Procedure: RELEASE TRIGGER FINGER/A-1 PULLEY LEFT THUMB;  Surgeon: Wynonia Sours, MD;  Location: [facility];  Service: Orthopedics;  Laterality: Left;  ANESTHESIA: IV REGIONAL FAB   Family History  Problem Relation Age of Onset  . Hypertension Mother   . Stroke Mother   . Stroke Father   .  Hypertension Father    Social History   Socioeconomic History  . Marital status: Married    Spouse name: Not on file  . Number of children: Not on file  . Years of education: Not on file  . Highest education level: Not on file  Occupational History  . Occupation: retired  Tobacco Use  . Smoking status: Never Smoker  . Smokeless tobacco: Never Used  Vaping Use  . Vaping Use: Never used  Substance and Sexual Activity  . Alcohol use: No  . Drug use: No  . Sexual activity: Not Currently  Other Topics Concern  .  Not on file  Social History Narrative  . Not on file   Social Determinants of Health   Financial Resource Strain: Low Risk   . Difficulty of Paying Living Expenses: Not hard at all  Food Insecurity: No Food Insecurity  . Worried About Charity fundraiser in the Last Year: Never true  . Ran Out of Food in the Last Year: Never true  Transportation Needs: No Transportation Needs  . Lack of Transportation (Medical): No  . Lack of Transportation (Non-Medical): No  Physical Activity: Inactive  . Days of Exercise per Week: 0 days  . Minutes of Exercise per Session: 0 min  Stress: No Stress Concern Present  . Feeling of Stress : Not at all  Social Connections:   . Frequency of Communication with Friends and Family: Not on file  . Frequency of Social Gatherings with Friends and Family: Not on file  . Attends Religious Services: Not on file  . Active Member of Clubs or Organizations: Not on file  . Attends Archivist Meetings: Not on file  . Marital Status: Not on file    Tobacco Counseling Counseling given: Not Answered   Clinical Intake:  Pre-visit preparation completed: Yes  Pain : 0-10 Pain Score: 4  Pain Type: Chronic pain Pain Location: Hand Pain Orientation: Right, Left Pain Descriptors / Indicators: Sore Pain Onset: More than a month ago Pain Frequency: Constant     Nutritional Status: BMI 25 -29 Overweight Nutritional Risks: None  How often do you need to have someone help you when you read instructions, pamphlets, or other written materials from your doctor or pharmacy?: 1 - Never What is the last grade level you completed in school?: 13 years  Diabetic? no  Interpreter Needed?: No  Information entered by :: NAllen LPN   Activities of Daily Living In your present state of health, do you have any difficulty performing the following activities: 06/19/2020 06/19/2020  Hearing? N N  Vision? N N  Difficulty concentrating or making decisions? N N    Walking or climbing stairs? N N  Dressing or bathing? N N  Doing errands, shopping? N N  Preparing Food and eating ? N -  Using the Toilet? N -  In the past six months, have you accidently leaked urine? Y -  Do you have problems with loss of bowel control? N -  Managing your Medications? N -  Managing your Finances? N -  Housekeeping or managing your Housekeeping? N -  Some recent data might be hidden    Patient Care Team: Glendale Chard, MD as PCP - General (Internal Medicine)  Indicate any recent Medical Services you may have received from other than [facility] providers in the past year (date may be approximate).     Assessment:   This is a routine wellness examination for Shearon.  Hearing/Vision screen  Hearing Screening   125Hz  250Hz  500Hz  1000Hz  2000Hz  3000Hz  4000Hz  6000Hz  8000Hz   Right ear:           Left ear:           Vision Screening Comments: Regular eye exams,   Dietary issues and exercise activities discussed:    Goals    .  DIET - INCREASE WATER INTAKE (pt-stated)    .  Patient Stated      06/14/2019, wants to keep A1C down so she does not get diabetes    .  Patient Stated      06/19/2020, maintain and stay active      Depression Screen PHQ 2/9 Scores 06/19/2020 06/19/2020 06/14/2019 11/01/2018 08/11/2018  PHQ - 2 Score 0 0 0 0 0  PHQ- 9 Score - - - - 0    Fall Risk Fall Risk  06/19/2020 06/19/2020 06/14/2019 11/01/2018 08/11/2018  Falls in the past year? 0 0 0 0 0  Risk for fall due to : Medication side effect - Medication side effect - Medication side effect  Follow up Falls evaluation completed;Education provided;Falls prevention discussed - Falls evaluation completed;Education provided;Falls prevention discussed - -    Any stairs in or around the home? Yes  If so, are there any without handrails? No  Home free of loose throw rugs in walkways, pet beds, electrical cords, etc? Yes  Adequate lighting in your home to reduce risk of falls? Yes   ASSISTIVE DEVICES  UTILIZED TO PREVENT FALLS:  Life alert? Yes  Use of a cane, walker or w/c? No  Grab bars in the bathroom? Yes  Shower chair or bench in shower? Yes  Elevated toilet seat or a handicapped toilet? Yes   TIMED UP AND GO:  Was the test performed? No .    Gait steady and fast without use of assistive device  Cognitive Function:     6CIT Screen 06/19/2020 06/14/2019 08/11/2018  What Year? 0 points 0 points 0 points  What month? 0 points 0 points 0 points  What time? 0 points 0 points 0 points  Count back from 20 0 points 0 points 0 points  Months in reverse 0 points 0 points 0 points  Repeat phrase 0 points 0 points 0 points  Total Score 0 0 0    Immunizations Immunization History  Administered Date(s) Administered  . Fluad Quad(high Dose 65+) 07/19/2019  . Influenza, High Dose Seasonal PF 12/15/2018  . Influenza-Unspecified 12/15/2018, 07/19/2019  . PFIZER SARS-COV-2 Vaccination 12/07/2019, 01/03/2020  . Tdap 03/03/2018    TDAP status: Up to date Flu Vaccine status: Completed at today's visit Pneumococcal vaccine status: sent to pharmacy Covid-19 vaccine status: Completed vaccines  Qualifies for Shingles Vaccine? Yes   Zostavax completed No   Shingrix Completed?: No.    Education has been provided regarding the importance of this vaccine. Patient has been advised to call insurance company to determine out of pocket expense if they have not yet received this vaccine. Advised may also receive vaccine at local pharmacy or Health Dept. Verbalized acceptance and understanding.  Screening Tests Health Maintenance  Topic Date Due  . PNA vac Low Risk Adult (1 of 2 - PCV13) Never done  . INFLUENZA VACCINE  05/05/2020  . MAMMOGRAM  07/26/2020  . TETANUS/TDAP  03/03/2028  . DEXA SCAN  Completed  . COVID-19 Vaccine  Completed    Health Maintenance  Health Maintenance Due  Topic Date Due  . PNA vac Low Risk Adult (1  of 2 - PCV13) Never done  . INFLUENZA VACCINE  05/05/2020     Colorectal cancer screening: No longer required.  Mammogram status: Completed 07/27/2019. Repeat every year Bone Density status: Completed 07/27/2019.   Lung Cancer Screening: (Low Dose CT Chest recommended if Age 77-80 years, 30 pack-year currently smoking OR have quit w/in 15years.) does not qualify.   Lung Cancer Screening Referral: no  Additional Screening:  Hepatitis C Screening: does not qualify;   Vision Screening: Recommended annual ophthalmology exams for early detection of glaucoma and other disorders of the eye. Is the patient up to date with their annual eye exam?  Yes  Who is the provider or what is the name of the office in which the patient attends annual eye exams? Does not remember If pt is not established with a provider, would they like to be referred to a provider to establish care? No .   Dental Screening: Recommended annual dental exams for proper oral hygiene  Community Resource Referral / Chronic Care Management: CRR required this visit?  No   CCM required this visit?  No      Plan:     I have personally reviewed and noted the following in the patient's chart:   . Medical and social history . Use of alcohol, tobacco or illicit drugs  . Current medications and supplements . Functional ability and status . Nutritional status . Physical activity . Advanced directives . List of other physicians . Hospitalizations, surgeries, and ER visits in previous 12 months . Vitals . Screenings to include cognitive, depression, and falls . Referrals and appointments  In addition, I have reviewed and discussed with patient certain preventive protocols, quality metrics, and best practice recommendations. A written personalized care plan for preventive services as well as general preventive health recommendations were provided to patient.     Kellie Simmering, LPN   [id number]   Nurse Notes:

## 2020-06-20 LAB — CBC
Hematocrit: 39.3 % (ref 34.0–46.6)
Hemoglobin: 12.6 g/dL (ref 11.1–15.9)
MCH: 30 pg (ref 26.6–33.0)
MCHC: 32.1 g/dL (ref 31.5–35.7)
MCV: 94 fL (ref 79–97)
Platelets: 251 10*3/uL (ref 150–450)
RBC: 4.2 x10E6/uL (ref 3.77–5.28)
RDW: 12.5 % (ref 11.7–15.4)
WBC: 5.1 10*3/uL (ref 3.4–10.8)

## 2020-06-20 LAB — CMP14+EGFR
ALT: 10 IU/L (ref 0–32)
AST: 20 IU/L (ref 0–40)
Albumin/Globulin Ratio: 2 (ref 1.2–2.2)
Albumin: 4.9 g/dL — ABNORMAL HIGH (ref 3.6–4.6)
Alkaline Phosphatase: 73 IU/L (ref 44–121)
BUN/Creatinine Ratio: 17 (ref 12–28)
BUN: 13 mg/dL (ref 8–27)
Bilirubin Total: 0.5 mg/dL (ref 0.0–1.2)
CO2: 28 mmol/L (ref 20–29)
Calcium: 10.5 mg/dL — ABNORMAL HIGH (ref 8.7–10.3)
Chloride: 102 mmol/L (ref 96–106)
Creatinine, Ser: 0.77 mg/dL (ref 0.57–1.00)
GFR calc Af Amer: 84 mL/min/{1.73_m2} (ref >59)
GFR calc non Af Amer: 73 mL/min/{1.73_m2} (ref >59)
Globulin, Total: 2.4 g/dL (ref 1.5–4.5)
Glucose: 103 mg/dL — ABNORMAL HIGH (ref 65–99)
Potassium: 3.8 mmol/L (ref 3.5–5.2)
Sodium: 143 mmol/L (ref 134–144)
Total Protein: 7.3 g/dL (ref 6.0–8.5)

## 2020-06-20 LAB — LIPID PANEL
Chol/HDL Ratio: 2.4 ratio (ref 0.0–4.4)
Cholesterol, Total: 179 mg/dL (ref 100–199)
HDL: 75 mg/dL (ref >39)
LDL Chol Calc (NIH): 89 mg/dL (ref 0–99)
Triglycerides: 85 mg/dL (ref 0–149)
VLDL Cholesterol Cal: 15 mg/dL (ref 5–40)

## 2020-06-20 LAB — HEMOGLOBIN A1C
Est. average glucose Bld gHb Est-mCnc: 134 mg/dL
Hgb A1c MFr Bld: 6.3 % — ABNORMAL HIGH (ref 4.8–5.6)

## 2020-06-21 ENCOUNTER — Telehealth: Payer: Self-pay

## 2020-06-21 NOTE — Telephone Encounter (Signed)
Left the patient a message to call back for lab results. 

## 2020-06-21 NOTE — Telephone Encounter (Signed)
-----   Message from Glendale Chard, MD sent at 06/20/2020 11:04 PM EDT ----- Here are your lab results:  Your liver and kidney function are stable. Your calcium level is elevated. Are you taking calcium supplements?   Your cholesterol is great. Your hba1c is 6.3, this is in prediabetes range.   Please let me know if you have any questions or concerns. Stay safe!   Sincerely,    Robyn N. Baird Cancer, MD

## 2020-06-25 ENCOUNTER — Telehealth: Payer: Self-pay

## 2020-06-25 NOTE — Telephone Encounter (Signed)
-----   Message from Glendale Chard, MD sent at 06/20/2020 11:04 PM EDT ----- Here are your lab results:  Your liver and kidney function are stable. Your calcium level is elevated. Are you taking calcium supplements?   Your cholesterol is great. Your hba1c is 6.3, this is in prediabetes range.   Please let me know if you have any questions or concerns. Stay safe!   Sincerely,    Robyn N. Baird Cancer, MD

## 2020-06-25 NOTE — Telephone Encounter (Signed)
Left the patient a message to call back for lab results. 

## 2020-07-02 ENCOUNTER — Other Ambulatory Visit: Payer: Self-pay | Admitting: Internal Medicine

## 2020-07-16 ENCOUNTER — Other Ambulatory Visit: Payer: Self-pay | Admitting: Internal Medicine

## 2020-07-30 ENCOUNTER — Telehealth: Payer: Self-pay

## 2020-07-30 NOTE — Telephone Encounter (Signed)
I returned the pt's call and scheduled her an appt for evaluation of dizziness.

## 2020-07-31 ENCOUNTER — Ambulatory Visit (INDEPENDENT_AMBULATORY_CARE_PROVIDER_SITE_OTHER): Payer: Medicare Other | Admitting: Internal Medicine

## 2020-07-31 ENCOUNTER — Encounter: Payer: Self-pay | Admitting: Internal Medicine

## 2020-07-31 ENCOUNTER — Other Ambulatory Visit: Payer: Self-pay

## 2020-07-31 VITALS — Temp 98.0°F | Ht 60.2 in | Wt 137.6 lb

## 2020-07-31 DIAGNOSIS — E663 Overweight: Secondary | ICD-10-CM | POA: Diagnosis not present

## 2020-07-31 DIAGNOSIS — I10 Essential (primary) hypertension: Secondary | ICD-10-CM

## 2020-07-31 DIAGNOSIS — H8110 Benign paroxysmal vertigo, unspecified ear: Secondary | ICD-10-CM

## 2020-07-31 DIAGNOSIS — L989 Disorder of the skin and subcutaneous tissue, unspecified: Secondary | ICD-10-CM

## 2020-07-31 DIAGNOSIS — Z6826 Body mass index (BMI) 26.0-26.9, adult: Secondary | ICD-10-CM

## 2020-07-31 MED ORDER — MECLIZINE HCL 25 MG PO TABS: 25.0000 mg | ORAL_TABLET | Freq: Three times a day (TID) | ORAL | 0 refills | Status: DC | PRN

## 2020-07-31 NOTE — Patient Instructions (Addendum)

## 2020-07-31 NOTE — Progress Notes (Signed)
I,Tianna Badgett,acting as a Education administrator for [name], MD.,have documented all relevant documentation on the behalf of [name], MD,as directed by  [name], MD while in the presence of [name], MD.  This visit occurred during the SARS-CoV-2 public health emergency.  Safety protocols were in place, including screening questions prior to the visit, additional usage of staff PPE, and extensive cleaning of exam room while observing appropriate contact time as indicated for disinfecting solutions.  Subjective:     Patient ID: Erica Meadows , female    DOB: 1936/07/08 , 84 y.o.   MRN: [id number]   Chief Complaint  Patient presents with  . Dizziness    HPI  Patient stated is here for dizziness. It started Monday in the middle of the night. It has been constant since then. Her symptoms remind her of the vertigo she experienced last year.  She denies recent URI.  She reports that she drinks plenty of water. Denies tinnitus, hoarseness and postnasal drip. Denies sinus congestion and cough. She states she feels as if the room is spinning when turning over in bed and changing positions. Her husband drove her to the office today.   Dizziness This is a recurrent problem. The current episode started in the past 7 days. The problem occurs 2 to 4 times per day.     Past Medical History:  Diagnosis Date  . High cholesterol   . Hyperparathyroidism ([place])   . Hypertension    under control; has been on med. x 15-20 yr.  . Seasonal allergies   . Trigger thumb of left hand 12/2012     Family History  Problem Relation Age of Onset  . Hypertension Mother   . Stroke Mother   . Stroke Father   . Hypertension Father      Current Outpatient Medications:  .  acetaminophen (TYLENOL) 500 MG tablet, Take 500 mg by mouth every 6 (six) hours as needed. prn, Disp: , Rfl:  .  amLODipine (NORVASC) 10 MG tablet, TAKE 1 TABLET BY MOUTH EVERY DAY, Disp: 90 tablet, Rfl: 1 .  aspirin 81 MG  tablet, Take 81 mg by mouth every evening. , Disp: , Rfl:  .  calcium carbonate (OSCAL) 1500 (600 Ca) MG TABS tablet, Take by mouth 2 (two) times daily with a meal., Disp: , Rfl:  .  carvedilol (COREG) 12.5 MG tablet, TAKE 1 TABLET BY MOUTH TWICE A DAY WITH FOOD, Disp: 180 tablet, Rfl: 1 .  cholecalciferol (VITAMIN D) 1000 units tablet, Take 5,000 Units by mouth daily. , Disp: , Rfl:  .  furosemide (LASIX) 20 MG tablet, TAKE 1 TABLET BY MOUTH EVERY DAY, Disp: 90 tablet, Rfl: 1 .  KLOR-CON M20 20 MEQ tablet, TAKE 1 TABLET BY MOUTH EVERY DAY, Disp: 90 tablet, Rfl: 1 .  losartan-hydrochlorothiazide (HYZAAR) 100-12.5 MG tablet, TAKE 1 TABLET BY MOUTH EVERY DAY, Disp: 90 tablet, Rfl: 1 .  meclizine (ANTIVERT) 25 MG tablet, Take 1 tablet (25 mg total) by mouth 3 (three) times daily as needed for dizziness., Disp: 30 tablet, Rfl: 0 .  rosuvastatin (CRESTOR) 10 MG tablet, TAKE 1 TABLET BY MOUTH EVERY DAY, Disp: 90 tablet, Rfl: 1   No Known Allergies   Review of Systems  Constitutional: Negative.   HENT: Negative for ear discharge and ear pain.   Respiratory: Negative.   Cardiovascular: Negative.   Gastrointestinal: Negative.   Neurological: Positive for dizziness.  Psychiatric/Behavioral: Negative.  Today's Vitals   07/31/20 0933  Temp: 98 F (36.7 C)  TempSrc: Oral  Weight: 137 lb 9.6 oz (62.4 kg)  Height: 5' 0.2" (1.529 m)   Body mass index is 26.69 kg/m.  Wt Readings from Last 3 Encounters:  07/31/20 137 lb 9.6 oz (62.4 kg)  06/19/20 136 lb (61.7 kg)  06/19/20 136 lb 12.8 oz (62.1 kg)     Objective:  Physical Exam Vitals and nursing note reviewed.  Constitutional:      Appearance: Normal appearance.  HENT:     Head: Normocephalic and atraumatic.     Right Ear: Tympanic membrane, ear canal and external ear normal.     Left Ear: Tympanic membrane, ear canal and external ear normal.  Eyes:     Comments: Horizontal nystagmus  Cardiovascular:     Rate and Rhythm: Normal  rate and regular rhythm.     Heart sounds: Normal heart sounds.  Pulmonary:     Effort: Pulmonary effort is normal.     Breath sounds: Normal breath sounds.  Skin:    General: Skin is warm.     Comments: Hyperkeratotic, hyperpigmented lesion on back of r earlobe, no surrounding erythema.   Neurological:     General: No focal deficit present.     Mental Status: She is alert.  Psychiatric:        Mood and Affect: Mood normal.        Behavior: Behavior normal.         Assessment And Plan:     1. Benign paroxysmal positional vertigo, unspecified laterality Comments: She was given rx meclizine to use tid prn. She will let me know if her sx persist. If so, she wishes to return to Audiology for manipulation.   2. Essential hypertension Comments: Chronic, well controlled. She is negative for orthostatics. She will continue with current meds.  She will f/u in March 2022 for re-evaluation.   3. Skin lesion Comments: I will refer her to Derm for further evaluation as requested.  - Ambulatory referral to Dermatology  4. Overweight with body mass index (BMI) of 26 to 26.9 in adult She is encouraged to strive for BMI less than 26 to decrease cardiac risk. Advised to aim for at least 150 minutes of exercise per week.     Patient was given opportunity to ask questions. Patient verbalized understanding of the plan and was able to repeat key elements of the plan. All questions were answered to their satisfaction.  [name], MD   I, [name], MD, have reviewed all documentation for this visit. The documentation on 07/31/20 for the exam, diagnosis, procedures, and orders are all accurate and complete.  THE PATIENT IS ENCOURAGED TO PRACTICE SOCIAL DISTANCING DUE TO THE COVID-19 PANDEMIC.

## 2020-08-01 ENCOUNTER — Telehealth: Payer: Self-pay

## 2020-08-01 NOTE — Telephone Encounter (Signed)
Per RS: pls call to check on pt. is she still dizzy? does she feel any better? LVM for pt to call the office when she gets a chance

## 2020-08-01 NOTE — Telephone Encounter (Signed)
Pt LVM she is feeling better she still feels a little dizzy, but feeling much better

## 2020-08-06 ENCOUNTER — Other Ambulatory Visit: Payer: Self-pay | Admitting: Internal Medicine

## 2020-08-09 ENCOUNTER — Telehealth: Payer: Self-pay

## 2020-08-09 NOTE — Telephone Encounter (Signed)
I left the pt a message to call the office back.  Dr. Baird Cancer wanted to check and see how the pt is feeling and to let her know that she should hear back about her referral next week.

## 2020-08-12 ENCOUNTER — Other Ambulatory Visit: Payer: Self-pay

## 2020-08-12 MED ORDER — MECLIZINE HCL 25 MG PO TABS: 25.0000 mg | ORAL_TABLET | Freq: Three times a day (TID) | ORAL | 0 refills | Status: DC | PRN

## 2020-08-24 ENCOUNTER — Other Ambulatory Visit: Payer: Self-pay | Admitting: Internal Medicine

## 2020-08-27 DIAGNOSIS — Z1231 Encounter for screening mammogram for malignant neoplasm of breast: Secondary | ICD-10-CM | POA: Diagnosis not present

## 2020-08-27 LAB — HM MAMMOGRAPHY: HM Mammogram: NORMAL (ref 0–4)

## 2020-09-18 ENCOUNTER — Encounter: Payer: Self-pay | Admitting: Internal Medicine

## 2020-10-04 ENCOUNTER — Other Ambulatory Visit: Payer: Self-pay | Admitting: Internal Medicine

## 2020-10-04 ENCOUNTER — Other Ambulatory Visit: Payer: Self-pay | Admitting: Nurse Practitioner

## 2020-12-18 ENCOUNTER — Ambulatory Visit (INDEPENDENT_AMBULATORY_CARE_PROVIDER_SITE_OTHER): Payer: Medicare Other | Admitting: Internal Medicine

## 2020-12-18 ENCOUNTER — Other Ambulatory Visit: Payer: Self-pay

## 2020-12-18 ENCOUNTER — Encounter: Payer: Self-pay | Admitting: Internal Medicine

## 2020-12-18 VITALS — BP 136/74 | HR 68 | Temp 98.4°F | Ht 60.2 in | Wt 139.8 lb

## 2020-12-18 DIAGNOSIS — M79642 Pain in left hand: Secondary | ICD-10-CM

## 2020-12-18 DIAGNOSIS — I10 Essential (primary) hypertension: Secondary | ICD-10-CM

## 2020-12-18 DIAGNOSIS — Z8639 Personal history of other endocrine, nutritional and metabolic disease: Secondary | ICD-10-CM | POA: Diagnosis not present

## 2020-12-18 DIAGNOSIS — R7309 Other abnormal glucose: Secondary | ICD-10-CM

## 2020-12-18 DIAGNOSIS — M79641 Pain in right hand: Secondary | ICD-10-CM

## 2020-12-18 DIAGNOSIS — Z6827 Body mass index (BMI) 27.0-27.9, adult: Secondary | ICD-10-CM

## 2020-12-18 DIAGNOSIS — E663 Overweight: Secondary | ICD-10-CM

## 2020-12-18 NOTE — Patient Instructions (Signed)

## 2020-12-18 NOTE — Progress Notes (Signed)
e I,Katawbba Wiggins,acting as a scribe for Robyn N Sanders, MD.,have documented all relevant documentation on the behalf of Robyn N Sanders, MD,as directed by  Robyn N Sanders, MD while in the presence of Robyn N Sanders, MD.  This visit occurred during the SARS-CoV-2 public health emergency.  Safety protocols were in place, including screening questions prior to the visit, additional usage of staff PPE, and extensive cleaning of exam room while observing appropriate contact time as indicated for disinfecting solutions.  Subjective:     Patient ID: Erica Meadows , female    DOB: 10/10/1935 , 85 y.o.   MRN: [id number]   Chief Complaint  Patient presents with  . Hypertension    HPI  The patient is here today for a blood pressure follow-up. She reports compliance with meds. She denies headaches, chest pain and shortness of breath.   Hypertension This is a chronic problem. The current episode started more than 1 year ago. The problem has been gradually improving since onset. The problem is controlled. Pertinent negatives include no blurred vision, chest pain, palpitations or shortness of breath. Risk factors for coronary artery disease include post-menopausal state and sedentary lifestyle. Past treatments include calcium channel blockers, angiotensin blockers and diuretics. The current treatment provides moderate improvement.     Past Medical History:  Diagnosis Date  . High cholesterol   . Hyperparathyroidism ([facility])   . Hypertension    under control; has been on med. x 15-20 yr.  . Seasonal allergies   . Trigger thumb of left hand 12/2012     Family History  Problem Relation Age of Onset  . Hypertension Mother   . Stroke Mother   . Stroke Father   . Hypertension Father      Current Outpatient Medications:  .  acetaminophen (TYLENOL) 500 MG tablet, Take 500 mg by mouth every 6 (six) hours as needed. prn, Disp: , Rfl:  .  amLODipine (NORVASC) 10 MG tablet, TAKE 1 TABLET BY  MOUTH EVERY DAY, Disp: 90 tablet, Rfl: 1 .  aspirin 81 MG tablet, Take 81 mg by mouth every evening. , Disp: , Rfl:  .  calcium carbonate (OSCAL) 1500 (600 Ca) MG TABS tablet, Take by mouth 2 (two) times daily with a meal. 1 tablet sometimes, Disp: , Rfl:  .  carvedilol (COREG) 12.5 MG tablet, TAKE 1 TABLET BY MOUTH TWICE A DAY WITH FOOD, Disp: 180 tablet, Rfl: 1 .  cholecalciferol (VITAMIN D) 1000 units tablet, Take 5,000 Units by mouth daily. , Disp: , Rfl:  .  furosemide (LASIX) 20 MG tablet, TAKE 1 TABLET BY MOUTH EVERY DAY, Disp: 90 tablet, Rfl: 1 .  KLOR-CON M20 20 MEQ tablet, TAKE 1 TABLET BY MOUTH EVERY DAY, Disp: 90 tablet, Rfl: 1 .  losartan-hydrochlorothiazide (HYZAAR) 100-12.5 MG tablet, TAKE 1 TABLET BY MOUTH EVERY DAY, Disp: 90 tablet, Rfl: 1 .  meclizine (ANTIVERT) 25 MG tablet, Take 1 tablet (25 mg total) by mouth 3 (three) times daily as needed for dizziness., Disp: 30 tablet, Rfl: 0 .  rosuvastatin (CRESTOR) 10 MG tablet, TAKE 1 TABLET BY MOUTH EVERY DAY, Disp: 90 tablet, Rfl: 1   No Known Allergies   Review of Systems  Constitutional: Negative.   Eyes: Negative for blurred vision.  Respiratory: Negative.  Negative for shortness of breath.   Cardiovascular: Negative for chest pain and palpitations. Leg swelling: bilateral.  Gastrointestinal: Negative.   Musculoskeletal: Positive for arthralgias.       She c/o b/l hand   pain. There is stiffness upon awakening in the morning. Feels like her hands get stuck. Has not tried any meds for relief.   Psychiatric/Behavioral: Negative.   All other systems reviewed and are negative.    Today's Vitals   12/18/20 1411  BP: 136/74  Pulse: 68  Temp: 98.4 F (36.9 C)  TempSrc: Oral  Weight: 139 lb 12.8 oz (63.4 kg)  Height: 5' 0.2" (1.529 m)  PainSc: 7   PainLoc: Hand   Body mass index is 27.12 kg/m.  Wt Readings from Last 3 Encounters:  12/18/20 139 lb 12.8 oz (63.4 kg)  07/31/20 137 lb 9.6 oz (62.4 kg)  06/19/20 136 lb  (61.7 kg)   Objective:  Physical Exam Vitals and nursing note reviewed.  Constitutional:      Appearance: Normal appearance.  HENT:     Head: Normocephalic and atraumatic.     Nose:     Comments: Masked     Mouth/Throat:     Comments: Masked  Cardiovascular:     Rate and Rhythm: Normal rate and regular rhythm.     Heart sounds: Normal heart sounds.  Pulmonary:     Effort: Pulmonary effort is normal.     Breath sounds: Normal breath sounds.  Musculoskeletal:        General: Tenderness present.     Cervical back: Normal range of motion.     Comments: Pos squeeze test on the right  Skin:    General: Skin is warm.  Neurological:     General: No focal deficit present.     Mental Status: She is alert and oriented to person, place, and time.         Assessment And Plan:     1. Essential hypertension Comments: Chronic, controlled. She will c/w current meds. Encouraged to avoid adding salt to her foods. Advised to decraease intake of packaged foods.  - CMP14+EGFR - AMB Referral to [facility]  2. Other abnormal glucose Comments: Her a1c has been elevated in the past. I will recheck an a1c today. I will make further recommendations once her labs are available.  - Hemoglobin A1c - AMB Referral to [facility]  3. Bilateral hand pain Comments: I will check an arthriits panel today. I will make further recommendations once her labs are available. Advised to apply topical Voltaren gel bid prn.  - ANA, IFA (with reflex) - CYCLIC CITRUL PEPTIDE ANTIBODY, IGG/IGA - Rheumatoid factor - Sedimentation rate - Uric acid - TSH  4. Overweight with body mass index (BMI) of 27 to 27.9 in adult Comments: Her BMI is acceptable for her demographic. Encouraged to aim for at least 150 minutes of exercise per week.   5. History of primary hyperparathyroidism Comments: I will check PTH, intact today.  - Parathyroid Hormone, Intact w/Ca  Patient was given  opportunity to ask questions. Patient verbalized understanding of the plan and was able to repeat key elements of the plan. All questions were answered to their satisfaction.   I, [name], MD, have reviewed all documentation for this visit. The documentation on 12/18/20 for the exam, diagnosis, procedures, and orders are all accurate and complete.   IF YOU HAVE BEEN REFERRED TO A SPECIALIST, IT MAY TAKE 1-2 WEEKS TO SCHEDULE/PROCESS THE REFERRAL. IF YOU HAVE NOT HEARD FROM US/SPECIALIST IN TWO WEEKS, PLEASE GIVE [place] A CALL AT [phone].   THE PATIENT IS ENCOURAGED TO PRACTICE SOCIAL DISTANCING DUE TO THE COVID-19 PANDEMIC.

## 2020-12-20 LAB — HEMOGLOBIN A1C
Est. average glucose Bld gHb Est-mCnc: 131 mg/dL
Hgb A1c MFr Bld: 6.2 % — ABNORMAL HIGH (ref 4.8–5.6)

## 2020-12-20 LAB — SEDIMENTATION RATE: Sed Rate: 3 mm/hr (ref 0–40)

## 2020-12-20 LAB — PTH, INTACT AND CALCIUM: PTH: 42 pg/mL (ref 15–65)

## 2020-12-20 LAB — ANTINUCLEAR ANTIBODIES, IFA: ANA Titer 1: NEGATIVE

## 2020-12-20 LAB — CMP14+EGFR
ALT: 11 IU/L (ref 0–32)
AST: 20 IU/L (ref 0–40)
Albumin/Globulin Ratio: 2 (ref 1.2–2.2)
Albumin: 4.7 g/dL — ABNORMAL HIGH (ref 3.6–4.6)
Alkaline Phosphatase: 78 IU/L (ref 44–121)
BUN/Creatinine Ratio: 17 (ref 12–28)
BUN: 11 mg/dL (ref 8–27)
Bilirubin Total: 0.6 mg/dL (ref 0.0–1.2)
CO2: 25 mmol/L (ref 20–29)
Calcium: 10.8 mg/dL — ABNORMAL HIGH (ref 8.7–10.3)
Chloride: 100 mmol/L (ref 96–106)
Creatinine, Ser: 0.65 mg/dL (ref 0.57–1.00)
Globulin, Total: 2.4 g/dL (ref 1.5–4.5)
Glucose: 100 mg/dL — ABNORMAL HIGH (ref 65–99)
Potassium: 3.2 mmol/L — ABNORMAL LOW (ref 3.5–5.2)
Sodium: 143 mmol/L (ref 134–144)
Total Protein: 7.1 g/dL (ref 6.0–8.5)
eGFR: 88 mL/min/{1.73_m2} (ref >59)

## 2020-12-20 LAB — RHEUMATOID FACTOR: Rheumatoid fact SerPl-aCnc: <10 IU/mL (ref <14.0)

## 2020-12-20 LAB — URIC ACID: Uric Acid: 4.2 mg/dL (ref 3.1–7.9)

## 2020-12-20 LAB — TSH: TSH: 0.992 u[IU]/mL (ref 0.450–4.500)

## 2020-12-20 LAB — CYCLIC CITRUL PEPTIDE ANTIBODY, IGG/IGA: Cyclic Citrullin Peptide Ab: 7 units (ref 0–19)

## 2020-12-26 ENCOUNTER — Telehealth: Payer: Self-pay | Admitting: *Deleted

## 2020-12-26 NOTE — Chronic Care Management (AMB) (Signed)
  Chronic Care Management   Outreach Note  12/26/2020 Name: Erica Meadows MRN: [id number] DOB: 1936/05/07  Erica Meadows is a 85 y.o. year old female who is a primary care patient of Glendale Chard, MD. I reached out to Mervin Hack by phone today in response to a referral sent by Ms. Vonzella W Oesterling's PCP, Dr. Baird Cancer.     An unsuccessful telephone outreach was attempted today. The patient was referred to the case management team for assistance with care management and care coordination.   Follow Up Plan: A HIPAA compliant phone message was left for the patient providing contact information and requesting a return call. The care management team will reach out to the patient again over the next 7 days. If patient returns call to provider office, please advise to call [facility] at [phone].  [place], [place] [address] Direct Dial: [phone] [email or web address] Website: [email or web address]

## 2020-12-27 NOTE — Chronic Care Management (AMB) (Signed)
  Chronic Care Management   Note  12/27/2020 Name: ELIYANNA AULT MRN: [id number] DOB: 1935-12-17  Acsa BURNELL MATLIN is a 85 y.o. year old female who is a primary care patient of Glendale Chard, MD. I reached out to Mervin Hack by phone today in response to a referral sent by Ms. Jacolyn W Rison's PCP, Dr. Baird Cancer.       Ms. Myhre was given information about Chronic Care Management services today including:  1. CCM service includes personalized support from designated clinical staff supervised by her physician, including individualized plan of care and coordination with other care providers 2. 24/7 contact phone numbers for assistance for urgent and routine care needs. 3. Service will only be billed when office clinical staff spend 20 minutes or more in a month to coordinate care. 4. Only one practitioner may furnish and bill the service in a calendar month. 5. The patient may stop CCM services at any time (effective at the end of the month) by phone call to the office staff. 6. The patient will be responsible for cost sharing (co-pay) of up to 20% of the service fee (after annual deductible is met).  Patient agreed to services and verbal consent obtained.   Follow up plan: Telephone appointment with care management team member scheduled for: 01/20/2021  [facility]

## 2021-01-02 ENCOUNTER — Other Ambulatory Visit: Payer: Self-pay | Admitting: Internal Medicine

## 2021-01-08 ENCOUNTER — Encounter: Payer: Self-pay | Admitting: Dermatology

## 2021-01-08 ENCOUNTER — Ambulatory Visit: Payer: Medicare Other | Admitting: Dermatology

## 2021-01-08 ENCOUNTER — Other Ambulatory Visit: Payer: Self-pay

## 2021-01-08 DIAGNOSIS — L821 Other seborrheic keratosis: Secondary | ICD-10-CM | POA: Diagnosis not present

## 2021-01-08 DIAGNOSIS — Z1283 Encounter for screening for malignant neoplasm of skin: Secondary | ICD-10-CM | POA: Diagnosis not present

## 2021-01-08 NOTE — Patient Instructions (Signed)
Wrap moist wrap for 10 minutes to help with dry skin, apply CervaVe Cream.

## 2021-01-20 ENCOUNTER — Encounter: Payer: Self-pay | Admitting: Dermatology

## 2021-01-20 ENCOUNTER — Telehealth: Payer: Medicare Other

## 2021-01-20 ENCOUNTER — Telehealth: Payer: Self-pay

## 2021-01-20 NOTE — Telephone Encounter (Signed)
  Chronic Care Management   Outreach Note  01/20/2021 Name: Erica Meadows MRN: [id number] DOB: May 04, 1936  Referred by: Glendale Chard, MD Reason for referral : Chronic Care Management (Initial RN CM Call Attempt )   An unsuccessful telephone outreach was attempted today. The patient was referred to the case management team for assistance with care management and care coordination.   Follow Up Plan: A HIPAA compliant phone message was left for the patient providing contact information and requesting a return call. Telephone follow up appointment with care management team member scheduled for: 02/20/21  Barb Merino, RN, BSN, CCM Care Management Coordinator [facility]  Direct Phone: [phone]

## 2021-01-20 NOTE — Progress Notes (Signed)
    New Patient   Subjective  Erica Meadows is a 85 y.o. female who presents for the following: Skin Problem (New lesion on right ear x 4 months- no itch or bleed - I can't really see it.).  Possible new growth on right ear plus check other areas. Location:  Duration:  Quality:  Associated Signs/Symptoms: Modifying Factors:  Severity:  Timing: Context:    The following portions of the chart were reviewed this encounter and updated as appropriate:  Tobacco  Allergies  Meds  Problems  Med Hx  Surg Hx  Fam Hx      Objective  Well appearing patient in no apparent distress; mood and affect are within normal limits. Objective  face and back check: Face, neck and back check. No skin cancer.   Dry skin on legs, hands, safe to use CeraVe Cream over the counter.    Objective  Right Malar Cheek: Possible relatively new growth on back of right ear, plus several on back.  5 mm flattopped textured brown papule; dermoscopy typical  Images      A focused examination was performed including Head, neck, back.. Relevant physical exam findings are noted in the Assessment and Plan.   Assessment & Plan  Screening exam for skin cancer face and back check  Recheck as needed change  Seborrheic keratosis Right Malar Cheek  Reassured of the benign nature of seborrheic keratoses, but of course I will recheck if she feels there is change.      I, Lavonna Monarch, MD, have reviewed all documentation for this visit.  The documentation on 01/20/21 for the exam, diagnosis, procedures, and orders are all accurate and complete.

## 2021-02-03 ENCOUNTER — Other Ambulatory Visit: Payer: Self-pay | Admitting: Internal Medicine

## 2021-02-20 ENCOUNTER — Telehealth: Payer: Self-pay

## 2021-02-20 ENCOUNTER — Telehealth: Payer: Medicare Other

## 2021-02-20 NOTE — Telephone Encounter (Signed)
  Care Management   Follow Up Note   02/20/2021 Name: Erica Meadows MRN: [id number] DOB: [birth year]   Referred by: Glendale Chard, MD Reason for referral : Chronic Care Management (RNCM Initial Outreach - 2nd attempt )   A second unsuccessful telephone outreach was attempted today. The patient was referred to the case management team for assistance with care management and care coordination.   Follow Up Plan: A HIPPA compliant phone message was left for the patient providing contact information and requesting a return call.   Barb Merino, RN, BSN, CCM Care Management Coordinator [facility]  Direct Phone: [phone]

## 2021-03-15 ENCOUNTER — Other Ambulatory Visit: Payer: Self-pay | Admitting: Internal Medicine

## 2021-03-31 ENCOUNTER — Telehealth: Payer: Self-pay

## 2021-03-31 ENCOUNTER — Other Ambulatory Visit: Payer: Self-pay | Admitting: Internal Medicine

## 2021-03-31 NOTE — Telephone Encounter (Signed)
The pt was scheduled an appt for evaluation for covid medication.  The pt said she only has a  house phone and will need to be called on the phone.

## 2021-04-01 ENCOUNTER — Encounter: Payer: Self-pay | Admitting: Nurse Practitioner

## 2021-04-01 ENCOUNTER — Other Ambulatory Visit: Payer: Self-pay

## 2021-04-01 ENCOUNTER — Telehealth (INDEPENDENT_AMBULATORY_CARE_PROVIDER_SITE_OTHER): Payer: Medicare Other | Admitting: Nurse Practitioner

## 2021-04-01 VITALS — BP 96/41 | HR 56 | Temp 98.0°F | Ht 60.2 in | Wt 138.0 lb

## 2021-04-01 DIAGNOSIS — I959 Hypotension, unspecified: Secondary | ICD-10-CM

## 2021-04-01 DIAGNOSIS — U071 COVID-19: Secondary | ICD-10-CM

## 2021-04-01 DIAGNOSIS — R059 Cough, unspecified: Secondary | ICD-10-CM

## 2021-04-01 MED ORDER — BENZONATATE 100 MG PO CAPS: 100.0000 mg | ORAL_CAPSULE | Freq: Four times a day (QID) | ORAL | 1 refills | Status: DC | PRN

## 2021-04-01 NOTE — Progress Notes (Addendum)
Telephone Visit via done due to poor connectivity with video   This visit type was conducted due to national recommendations for restrictions regarding the COVID-19 Pandemic (e.g. social distancing) in an effort to limit this patient's exposure and mitigate transmission in our community.  Due to her co-morbid illnesses, this patient is at least at moderate risk for complications without adequate follow up.  This format is felt to be most appropriate for this patient at this time.  All issues noted in this document were discussed and addressed.  A limited physical exam was performed with this format.    This visit type was conducted due to national recommendations for restrictions regarding the COVID-19 Pandemic (e.g. social distancing) in an effort to limit this patient's exposure and mitigate transmission in our community.  Patients identity confirmed using two different identifiers.  This format is felt to be most appropriate for this patient at this time.  All issues noted in this document were discussed and addressed.  No physical exam was performed (except for noted visual exam findings with Video Visits).    Date:  04/10/2021   ID:  Erica Meadows, DOB Dec 07, 1935, MRN [id number]  Patient Location:  Home - spoke with Mariel Sleet  Provider location:   Office    Chief Complaint:  positive for covid  History of Present Illness:    Erica Meadows is a 85 y.o. female who presents via telephone for a telehealth visit today.    The patient does have symptoms concerning for COVID-19 infection (fever, chills, cough, or new shortness of breath).   She had been coughing since Sunday night, she took a home test yesterday. Denies fever. She has been taking over the counter cough medication. The more she is talking she will cough some. She feels okay, denies any pain. She has been drinking tea and eating light foods.     Past Medical History:  Diagnosis Date   High cholesterol     Hyperparathyroidism ([place])    Hypertension    under control; has been on med. x 15-20 yr.   Seasonal allergies    Trigger thumb of left hand 12/2012   Past Surgical History:  Procedure Laterality Date   ABDOMINAL HYSTERECTOMY  1975   partial   HAND  SURGERY  Left 03/2018   ANTERIOR  HAND ;  CUT INTO TENDON ON HAND WHILE CHOPPING CABBAGE ;      hysterectomy partial     PARATHYROIDECTOMY Right 06/16/2018   Procedure: RIGHT INFERIOR PARATHYROIDECTOMY;  Surgeon: [name] Gemma, MD;  Location: [facility];  Service: General;  Laterality: Right;   TRIGGER FINGER RELEASE Right    TRIGGER FINGER RELEASE Left 12/12/2012   Procedure: RELEASE TRIGGER FINGER/A-1 PULLEY LEFT THUMB;  Surgeon: Wynonia Sours, MD;  Location: [facility];  Service: Orthopedics;  Laterality: Left;  ANESTHESIA: IV REGIONAL FAB     Current Meds  Medication Sig   acetaminophen (TYLENOL) 500 MG tablet Take 500 mg by mouth every 6 (six) hours as needed. prn   amLODipine (NORVASC) 10 MG tablet TAKE 1 TABLET BY MOUTH EVERY DAY   aspirin 81 MG tablet Take 81 mg by mouth every evening.    benzonatate (TESSALON PERLES) 100 MG capsule Take 1 capsule (100 mg total) by mouth every 6 (six) hours as needed for cough.   calcium carbonate (OSCAL) 1500 (600 Ca) MG TABS tablet Take by mouth 2 (two) times daily with a meal. 1 tablet sometimes  carvedilol (COREG) 12.5 MG tablet TAKE 1 TABLET BY MOUTH TWICE A DAY WITH FOOD   cholecalciferol (VITAMIN D) 1000 units tablet Take 5,000 Units by mouth daily.    furosemide (LASIX) 20 MG tablet TAKE 1 TABLET BY MOUTH EVERY DAY   KLOR-CON M20 20 MEQ tablet TAKE 1 TABLET BY MOUTH EVERY DAY   losartan-hydrochlorothiazide (HYZAAR) 100-12.5 MG tablet TAKE 1 TABLET BY MOUTH EVERY DAY   rosuvastatin (CRESTOR) 10 MG tablet TAKE 1 TABLET BY MOUTH EVERY DAY     Allergies:   Patient has no known allergies.   Social History   Tobacco Use   Smoking status: Never   Smokeless tobacco: Never  Vaping  Use   Vaping Use: Never used  Substance Use Topics   Alcohol use: No   Drug use: No     Family Hx: The patient's family history includes Hypertension in her father and mother; Stroke in her father and mother.  ROS:   Please see the history of present illness.    Review of Systems  Constitutional: Negative.   Respiratory:  Positive for cough and sputum production. Negative for wheezing.   Cardiovascular: Negative.   Neurological:  Negative for dizziness and tingling.  Psychiatric/Behavioral: Negative.     All other systems reviewed and are negative.   Labs/Other Tests and Data Reviewed:    Recent Labs: 06/19/2020: Hemoglobin 12.6; Platelets 251 12/18/2020: ALT 11; BUN 11; Creatinine, Ser 0.65; Potassium 3.2; Sodium 143; TSH 0.992   Recent Lipid Panel Lab Results  Component Value Date/Time   CHOL 179 06/19/2020 05:52 PM   TRIG 85 06/19/2020 05:52 PM   HDL 75 06/19/2020 05:52 PM   CHOLHDL 2.4 06/19/2020 05:52 PM   LDLCALC 89 06/19/2020 05:52 PM    Wt Readings from Last 3 Encounters:  04/01/21 138 lb (62.6 kg)  12/18/20 139 lb 12.8 oz (63.4 kg)  07/31/20 137 lb 9.6 oz (62.4 kg)     Exam:    Vital Signs:  BP (!) 96/41   Pulse (!) 56   Temp 98 F (36.7 C)   Ht 5' 0.2" (1.529 m)   Wt 138 lb (62.6 kg)   BMI 26.77 kg/m     Physical Exam Vitals reviewed.  Constitutional:      General: She is not in acute distress. Pulmonary:     Effort: Pulmonary effort is normal. No respiratory distress.  Neurological:     Mental Status: She is alert and oriented to person, place, and time.  Psychiatric:        Mood and Affect: Mood normal.        Behavior: Behavior normal.        Thought Content: Thought content normal.        Judgment: Judgment normal.    ASSESSMENT & PLAN:    1. COVID-19 She has tested positive for covid with a home test. Continues to have a cough, will treat with tessalon perles.   2. Cough If has shortness of breath go to ER for further  evaluation - benzonatate (TESSALON PERLES) 100 MG capsule; Take 1 capsule (100 mg total) by mouth every 6 (six) hours as needed for cough.  Dispense: 30 capsule; Refill: 1  3. Hypotension, unspecified hypotension type Her blood pressure has been low, I have advised her to hold her losartan and check her blood pressure daily and to call with her readings.  She does not have any symptoms at this time     COVID-19 Education: The  signs and symptoms of COVID-19 were discussed with the patient and how to seek care for testing (follow up with PCP or arrange E-visit).  The importance of social distancing was discussed today.  Patient Risk:   After full review of this patients clinical status, I feel that they are at least moderate risk at this time.  Time:   Today, I have spent 13 minutes/ seconds with the patient with telehealth technology discussing above diagnoses.     Medication Adjustments/Labs and Tests Ordered: Current medicines are reviewed at length with the patient today.  Concerns regarding medicines are outlined above.   Tests Ordered: No orders of the defined types were placed in this encounter.   Medication Changes: Meds ordered this encounter  Medications   benzonatate (TESSALON PERLES) 100 MG capsule    Sig: Take 1 capsule (100 mg total) by mouth every 6 (six) hours as needed for cough.    Dispense:  30 capsule    Refill:  1    Disposition:  Follow up prn  Signed, Minette Brine, FNP

## 2021-04-10 ENCOUNTER — Encounter: Payer: Self-pay | Admitting: Nurse Practitioner

## 2021-04-14 ENCOUNTER — Telehealth: Payer: Medicare Other

## 2021-04-14 ENCOUNTER — Ambulatory Visit (INDEPENDENT_AMBULATORY_CARE_PROVIDER_SITE_OTHER): Payer: Medicare Other | Admitting: Internal Medicine

## 2021-04-14 ENCOUNTER — Other Ambulatory Visit: Payer: Self-pay

## 2021-04-14 ENCOUNTER — Ambulatory Visit (INDEPENDENT_AMBULATORY_CARE_PROVIDER_SITE_OTHER): Payer: Medicare Other

## 2021-04-14 ENCOUNTER — Encounter: Payer: Self-pay | Admitting: Internal Medicine

## 2021-04-14 VITALS — BP 128/60 | HR 53 | Temp 97.5°F | Ht 60.2 in | Wt 137.4 lb

## 2021-04-14 DIAGNOSIS — Z6826 Body mass index (BMI) 26.0-26.9, adult: Secondary | ICD-10-CM

## 2021-04-14 DIAGNOSIS — E663 Overweight: Secondary | ICD-10-CM

## 2021-04-14 DIAGNOSIS — Z8616 Personal history of COVID-19: Secondary | ICD-10-CM

## 2021-04-14 DIAGNOSIS — I10 Essential (primary) hypertension: Secondary | ICD-10-CM

## 2021-04-14 DIAGNOSIS — Z23 Encounter for immunization: Secondary | ICD-10-CM

## 2021-04-14 DIAGNOSIS — R7309 Other abnormal glucose: Secondary | ICD-10-CM

## 2021-04-14 MED ORDER — SHINGRIX 50 MCG/0.5ML IM SUSR: 0.5000 mL | Freq: Once | INTRAMUSCULAR | 0 refills | Status: AC

## 2021-04-14 NOTE — Progress Notes (Signed)
I,Tianna Badgett,acting as a Education administrator for [name], MD.,have documented all relevant documentation on the behalf of [name], MD,as directed by  [name], MD while in the presence of [name], MD.  This visit occurred during the SARS-CoV-2 public health emergency.  Safety protocols were in place, including screening questions prior to the visit, additional usage of staff PPE, and extensive cleaning of exam room while observing appropriate contact time as indicated for disinfecting solutions.  Subjective:     Patient ID: Erica Meadows , female    DOB: 16-Mar-1936 , 85 y.o.   MRN: [id number]   Chief Complaint  Patient presents with   Hypertension    HPI  The patient is here today for a blood pressure follow-up. She reports compliance with meds. She denies headaches, chest pain and shortness of breath.   Hypertension This is a chronic problem. The current episode started more than 1 year ago. The problem has been gradually improving since onset. The problem is controlled. Pertinent negatives include no blurred vision, chest pain, palpitations or shortness of breath. Risk factors for coronary artery disease include post-menopausal state and sedentary lifestyle. Past treatments include calcium channel blockers, angiotensin blockers and diuretics. The current treatment provides moderate improvement.    Past Medical History:  Diagnosis Date   High cholesterol    Hyperparathyroidism ([place])    Hypertension    under control; has been on med. x 15-20 yr.   Seasonal allergies    Trigger thumb of left hand 12/2012     Family History  Problem Relation Age of Onset   Hypertension Mother    Stroke Mother    Stroke Father    Hypertension Father      Current Outpatient Medications:    acetaminophen (TYLENOL) 500 MG tablet, Take 500 mg by mouth every 6 (six) hours as needed. prn, Disp: , Rfl:    amLODipine (NORVASC) 10 MG tablet, TAKE 1 TABLET BY MOUTH EVERY DAY,  Disp: 90 tablet, Rfl: 1   aspirin 81 MG tablet, Take 81 mg by mouth every evening. , Disp: , Rfl:    calcium carbonate (OSCAL) 1500 (600 Ca) MG TABS tablet, Take by mouth 2 (two) times daily with a meal. 1 tablet sometimes, Disp: , Rfl:    carvedilol (COREG) 12.5 MG tablet, TAKE 1 TABLET BY MOUTH TWICE A DAY WITH FOOD, Disp: 180 tablet, Rfl: 1   cholecalciferol (VITAMIN D) 1000 units tablet, Take 5,000 Units by mouth daily. , Disp: , Rfl:    furosemide (LASIX) 20 MG tablet, TAKE 1 TABLET BY MOUTH EVERY DAY, Disp: 90 tablet, Rfl: 1   KLOR-CON M20 20 MEQ tablet, TAKE 1 TABLET BY MOUTH EVERY DAY, Disp: 90 tablet, Rfl: 1   losartan-hydrochlorothiazide (HYZAAR) 100-12.5 MG tablet, TAKE 1 TABLET BY MOUTH EVERY DAY, Disp: 90 tablet, Rfl: 1   rosuvastatin (CRESTOR) 10 MG tablet, TAKE 1 TABLET BY MOUTH EVERY DAY, Disp: 90 tablet, Rfl: 1   No Known Allergies   Review of Systems  Constitutional: Negative.   Eyes:  Negative for blurred vision.  Respiratory: Negative.  Negative for shortness of breath.   Cardiovascular: Negative.  Negative for chest pain and palpitations.  Gastrointestinal: Negative.   Neurological: Negative.     Today's Vitals   04/14/21 1532  BP: 128/60  Pulse: (!) 53  Temp: (!) 97.5 F (36.4 C)  TempSrc: Oral  Weight: 137 lb 6.4 oz (62.3 kg)  Height: 5' 0.2" (1.529 m)  Body mass index is 26.66 kg/m.  Wt Readings from Last 3 Encounters:  04/14/21 137 lb 6.4 oz (62.3 kg)  04/01/21 138 lb (62.6 kg)  12/18/20 139 lb 12.8 oz (63.4 kg)    Objective:  Physical Exam Vitals and nursing note reviewed.  Constitutional:      Appearance: Normal appearance.  HENT:     Head: Normocephalic and atraumatic.     Nose:     Comments: Masked     Mouth/Throat:     Comments: Masked  Eyes:     Extraocular Movements: Extraocular movements intact.  Cardiovascular:     Rate and Rhythm: Normal rate and regular rhythm.     Heart sounds: Normal heart sounds.  Pulmonary:     Effort:  Pulmonary effort is normal.     Breath sounds: Normal breath sounds.  Musculoskeletal:     Cervical back: Normal range of motion.  Skin:    General: Skin is warm.  Neurological:     General: No focal deficit present.     Mental Status: She is alert.  Psychiatric:        Mood and Affect: Mood normal.        Behavior: Behavior normal.        Assessment And Plan:     1. Essential hypertension Comments: Chronic, well controlled. She is encouraged to follow low sodium diet. I will check renal function at her next visit. I will not change med regimen.  2. Overweight with body mass index (BMI) of 26 to 26.9 in adult Comments: Her BMI is acceptable for her demographic. She is encouraged to aim for at least 150 minutes of exercise per week.   3. Immunization due Comments: I will send rx Shingrix to her local pharmacy.   4. Personal history of COVID-19 Comments: She is fully vaccinated and boosted x 1.   Patient was given opportunity to ask questions. Patient verbalized understanding of the plan and was able to repeat key elements of the plan. All questions were answered to their satisfaction.   I, [name], MD, have reviewed all documentation for this visit. The documentation on 04/26/21 for the exam, diagnosis, procedures, and orders are all accurate and complete.   IF YOU HAVE BEEN REFERRED TO A SPECIALIST, IT MAY TAKE 1-2 WEEKS TO SCHEDULE/PROCESS THE REFERRAL. IF YOU HAVE NOT HEARD FROM US/SPECIALIST IN TWO WEEKS, PLEASE GIVE [place] A CALL AT [phone].   THE PATIENT IS ENCOURAGED TO PRACTICE SOCIAL DISTANCING DUE TO THE COVID-19 PANDEMIC.

## 2021-04-14 NOTE — Patient Instructions (Signed)

## 2021-04-17 NOTE — Patient Instructions (Signed)
Goals Addressed      Monitor and Manage My Blood Sugar-Diabetes Type 2   On track    Timeframe:  Long-Range Goal Priority:  Medium Start Date:  04/14/21                           Expected End Date:  04/14/22                     Follow Up Date 06/30/21       Why is this important?   Checking your blood sugar at home helps to keep it from getting very high or very low.  Writing the results in a diary or log helps the doctor know how to care for you.  Your blood sugar log should have the time, date and the results.  Also, write down the amount of insulin or other medicine that you take.  Other information, like what you ate, exercise done and how you were feeling, will also be helpful.     Notes:      Track and Manage My Blood Pressure-Hypertension   On track    Timeframe:  Long-Range Goal Priority:  High Start Date:  04/14/21                           Expected End Date:  04/14/22                     Follow Up Date 04/21/21    - check blood pressure daily - write blood pressure results in a log or diary    Why is this important?   You won't feel high blood pressure, but it can still hurt your blood vessels.  High blood pressure can cause heart or kidney problems. It can also cause a stroke.  Making lifestyle changes like losing a Erica Meadows weight or eating less salt will help.  Checking your blood pressure at home and at different times of the day can help to control blood pressure.  If the doctor prescribes medicine remember to take it the way the doctor ordered.  Call the office if you cannot afford the medicine or if there are questions about it.     Notes:

## 2021-04-17 NOTE — Chronic Care Management (AMB) (Signed)
Chronic Care Management   CCM RN Visit Note  04/14/2021 Name: Erica Meadows MRN: [id number] DOB: 25-Jun-1936  Subjective: Erica Meadows is a 85 y.o. year old female who is a primary care patient of Glendale Chard, MD. The care management team was consulted for assistance with disease management and care coordination needs.    Engaged with patient by telephone for initial visit in response to provider referral for case management and/or care coordination services.   Consent to Services:  The patient was given the following information about Chronic Care Management services today, agreed to services, and gave verbal consent: 1. CCM service includes personalized support from designated clinical staff supervised by the primary care provider, including individualized plan of care and coordination with other care providers 2. 24/7 contact phone numbers for assistance for urgent and routine care needs. 3. Service will only be billed when office clinical staff spend 20 minutes or more in a month to coordinate care. 4. Only one practitioner may furnish and bill the service in a calendar month. 5.The patient may stop CCM services at any time (effective at the end of the month) by phone call to the office staff. 6. The patient will be responsible for cost sharing (co-pay) of up to 20% of the service fee (after annual deductible is met). Patient agreed to services and consent obtained.  Patient agreed to services and verbal consent obtained.   Assessment: Review of patient past medical history, allergies, medications, health status, including review of consultants reports, laboratory and other test data, was performed as part of comprehensive evaluation and provision of chronic care management services.   SDOH (Social Determinants of Health) assessments and interventions performed:  Yes, no acute challenges   CCM Care Plan  No Known Allergies  Outpatient Encounter Medications as of 04/14/2021   Medication Sig   acetaminophen (TYLENOL) 500 MG tablet Take 500 mg by mouth every 6 (six) hours as needed. prn   amLODipine (NORVASC) 10 MG tablet TAKE 1 TABLET BY MOUTH EVERY DAY   aspirin 81 MG tablet Take 81 mg by mouth every evening.    calcium carbonate (OSCAL) 1500 (600 Ca) MG TABS tablet Take by mouth 2 (two) times daily with a meal. 1 tablet sometimes   carvedilol (COREG) 12.5 MG tablet TAKE 1 TABLET BY MOUTH TWICE A DAY WITH FOOD   cholecalciferol (VITAMIN D) 1000 units tablet Take 5,000 Units by mouth daily.    furosemide (LASIX) 20 MG tablet TAKE 1 TABLET BY MOUTH EVERY DAY   KLOR-CON M20 20 MEQ tablet TAKE 1 TABLET BY MOUTH EVERY DAY   losartan-hydrochlorothiazide (HYZAAR) 100-12.5 MG tablet TAKE 1 TABLET BY MOUTH EVERY DAY   rosuvastatin (CRESTOR) 10 MG tablet TAKE 1 TABLET BY MOUTH EVERY DAY   [DISCONTINUED] benzonatate (TESSALON PERLES) 100 MG capsule Take 1 capsule (100 mg total) by mouth every 6 (six) hours as needed for cough.   No facility-administered encounter medications on file as of 04/14/2021.    Patient Active Problem List   Diagnosis Date Noted   Vaginal discharge 11/01/2018   Essential hypertension 04/01/2018   Hyperlipidemia 04/01/2018   Pain and swelling of left lower extremity 04/01/2018    Conditions to be addressed/monitored: Essential hypertension, Abnormal glucose   Care Plan : Hypertension (Adult)  Updates made by Lynne Logan, RN since 04/14/2021 12:00 AM     Problem: Hypertension (Hypertension)   Priority: High     Long-Range Goal: Hypertension Monitored   Start Date: 04/14/2021  Expected End Date: 04/14/2022  This Visit's Progress: On track  Priority: High  Note:   Objective:  Last practice recorded BP readings:  BP Readings from Last 3 Encounters:  04/14/21 128/60  04/01/21 (!) 96/41  12/18/20 136/74   Most recent eGFR/CrCl:  Lab Results  Component Value Date   EGFR 88 12/18/2020    No components found for: CRCL Current  Barriers:  Knowledge Deficits related to basic understanding of hypertension pathophysiology and self care management Knowledge Deficits related to understanding of medications prescribed for management of hypertension Case Manager Clinical Goal(s):  patient will demonstrate improved adherence to prescribed treatment plan for hypertension as evidenced by taking all medications as prescribed, monitoring and recording blood pressure as directed, adhering to low sodium/DASH diet Interventions:  04/14/21 completed successful outbound call with patient  Collaboration with Glendale Chard, MD regarding development and update of comprehensive plan of care as evidenced by provider attestation and co-signature Inter-disciplinary care team collaboration (see longitudinal plan of care) Provided education to patient about basic hypertension disease process Review of patient status, including review of consultant's reports, relevant laboratory and other test results, and medications completed. Educated on target BP <130/80; Determined patient is experiencing low BP readings, PCP is aware and MD follow up is scheduled for this afternoon with Dr. Baird Cancer _0 :15 PM   Reviewed medications with patient and discussed importance of medication adherence Educated patient on dietary and exercise recommendations Discussed plans with patient for ongoing care management follow up and provided patient with direct contact information for care management team Self-Care Activities: Self administers medications as prescribed Attends all scheduled provider appointments Calls provider office for new concerns, questions, or BP outside discussed parameters Checks BP and records as discussed Follows a low sodium diet/DASH diet Patient Goals: - check blood pressure daily - write blood pressure results in a log or diary - learn about high blood pressure  Follow Up Plan: Telephone follow up appointment with care management team  member scheduled for: 04/21/21    Care Plan : Diabetes Type 2 (Adult)  Updates made by Lynne Logan, RN since 04/14/2021 12:00 AM     Problem: Glycemic Management (Diabetes, Type 2)   Priority: Medium     Long-Range Goal: Glycemic Management Optimized   Start Date: 04/14/2021  Expected End Date: 04/14/2022  This Visit's Progress: On track  Priority: Medium  Note:   Objective:  Lab Results  Component Value Date   HGBA1C 6.2 (H) 12/18/2020   Lab Results  Component Value Date   CREATININE 0.65 12/18/2020   CREATININE 0.77 06/19/2020   CREATININE 0.75 12/25/2019   Lab Results  Component Value Date   EGFR 88 12/18/2020   Current Barriers:  Knowledge Deficits related to basic Diabetes pathophysiology and self care/management Knowledge Deficits related to medications used for management of diabetes Case Manager Clinical Goal(s):  patient will demonstrate improved adherence to prescribed treatment plan for diabetes self care/management as evidenced by: adherence to ADA/ carb modified diet exercise 5 days/week contacting provider for new or worsened symptoms or questions Interventions:  04/14/21 completed successful outbound call with patient  Collaboration with Glendale Chard, MD regarding development and update of comprehensive plan of care as evidenced by provider attestation and co-signature Inter-disciplinary care team collaboration (see longitudinal plan of care) Provided education to patient about basic DM disease process Review of patient status, including review of consultant's reports, relevant laboratory and other test results, and medications completed. Reviewed medications with patient and discussed importance of  medication adherence Educated patient on dietary and exercise recommendations; daily glycemic control FBS 80-130, <180 after meals;15'15' rule  Discussed plans with patient for ongoing care management follow up and provided patient with direct contact  information for care management team Self-Care Activities - adhere to ADA/carb modified diet - exercise 5 days/week  - contact provider for new or worsened symptoms or questions  Patient Goals: - drink 6 to 8 glasses of water each day - manage portion size  Follow Up Plan: Telephone follow up appointment with care management team member scheduled for:  06/30/21     Plan:Telephone follow up appointment with care management team member scheduled for:  04/21/21  Barb Merino, RN, BSN, CCM Care Management Coordinator [place] [facility]  Direct Phone: [phone]

## 2021-04-21 ENCOUNTER — Telehealth: Payer: Medicare Other

## 2021-04-21 ENCOUNTER — Ambulatory Visit: Payer: Self-pay

## 2021-04-21 DIAGNOSIS — R7309 Other abnormal glucose: Secondary | ICD-10-CM

## 2021-04-21 DIAGNOSIS — I10 Essential (primary) hypertension: Secondary | ICD-10-CM

## 2021-04-21 NOTE — Chronic Care Management (AMB) (Signed)
Chronic Care Management   CCM RN Visit Note  04/21/2021 Name: Erica Meadows MRN: [id number] DOB: October 20, 1935  Subjective: Erica Meadows is a 85 y.o. year old female who is a primary care patient of Glendale Chard, MD. The care management team was consulted for assistance with disease management and care coordination needs.    Engaged with patient by telephone for follow up visit in response to provider referral for case management and/or care coordination services.   Consent to Services:  The patient was given information about Chronic Care Management services, agreed to services, and gave verbal consent prior to initiation of services.  Please see initial visit note for detailed documentation.   Patient agreed to services and verbal consent obtained.   Assessment: Review of patient past medical history, allergies, medications, health status, including review of consultants reports, laboratory and other test data, was performed as part of comprehensive evaluation and provision of chronic care management services.   SDOH (Social Determinants of Health) assessments and interventions performed:    CCM Care Plan  No Known Allergies  Outpatient Encounter Medications as of 04/21/2021  Medication Sig   acetaminophen (TYLENOL) 500 MG tablet Take 500 mg by mouth every 6 (six) hours as needed. prn   amLODipine (NORVASC) 10 MG tablet TAKE 1 TABLET BY MOUTH EVERY DAY   aspirin 81 MG tablet Take 81 mg by mouth every evening.    calcium carbonate (OSCAL) 1500 (600 Ca) MG TABS tablet Take by mouth 2 (two) times daily with a meal. 1 tablet sometimes   carvedilol (COREG) 12.5 MG tablet TAKE 1 TABLET BY MOUTH TWICE A DAY WITH FOOD   cholecalciferol (VITAMIN D) 1000 units tablet Take 5,000 Units by mouth daily.    furosemide (LASIX) 20 MG tablet TAKE 1 TABLET BY MOUTH EVERY DAY   KLOR-CON M20 20 MEQ tablet TAKE 1 TABLET BY MOUTH EVERY DAY   losartan-hydrochlorothiazide (HYZAAR) 100-12.5 MG tablet  TAKE 1 TABLET BY MOUTH EVERY DAY   rosuvastatin (CRESTOR) 10 MG tablet TAKE 1 TABLET BY MOUTH EVERY DAY   No facility-administered encounter medications on file as of 04/21/2021.    Patient Active Problem List   Diagnosis Date Noted   Vaginal discharge 11/01/2018   Essential hypertension 04/01/2018   Hyperlipidemia 04/01/2018   Pain and swelling of left lower extremity 04/01/2018    Conditions to be addressed/monitored: Essential hypertension, Abnormal glucose   Care Plan : Hypertension (Adult)  Updates made by Lynne Logan, RN since 04/21/2021 12:00 AM     Problem: Hypertension (Hypertension)   Priority: High     Long-Range Goal: Hypertension Monitored   Start Date: 04/14/2021  Expected End Date: 04/14/2022  Recent Progress: On track  Priority: High  Note:   Objective:  Last practice recorded BP readings:  BP Readings from Last 3 Encounters:  04/14/21 128/60  04/01/21 (!) 96/41  12/18/20 136/74   Most recent eGFR/CrCl:  Lab Results  Component Value Date   EGFR 88 12/18/2020    No components found for: CRCL  Current Barriers:  Knowledge Deficits related to basic understanding of hypertension pathophysiology and self care management Knowledge Deficits related to understanding of medications prescribed for management of hypertension Case Manager Clinical Goal(s):  patient will demonstrate improved adherence to prescribed treatment plan for hypertension as evidenced by taking all medications as prescribed, monitoring and recording blood pressure as directed, adhering to low sodium/DASH diet Interventions:  04/21/21 completed successful outbound call with patient  Collaboration with Baird Cancer,  Bailey Mech, MD regarding development and update of comprehensive plan of care as evidenced by provider attestation and co-signature Inter-disciplinary care team collaboration (see longitudinal plan of care) Provided education to patient about basic hypertension disease process Review of  patient status, including review of consultant's reports, relevant laboratory and other test results, and medications completed. Reviewed medications with patient and discussed importance of medication adherence Reviewed PCP follow up with Dr. Baird Cancer on 04/14/21 regarding low BP readings, patient will take her Losartan at bedtime for SBP >130 per Dr. Baird Cancer, no other medication changes recommended Educated patient on dietary and exercise recommendations Mailed printed educational materials related to "Why Should I Restrict Sodium?" Discussed plans with patient for ongoing care management follow up and provided patient with direct contact information for care management team Self-Care Activities: Self administers medications as prescribed Attends all scheduled provider appointments Calls provider office for new concerns, questions, or BP outside discussed parameters Checks BP and records as discussed Follows a low sodium diet/DASH diet Patient Goals: - check blood pressure daily - write blood pressure results in a log or diary - learn about high blood pressure  Follow Up Plan: Telephone follow up appointment with care management team member scheduled for: 06/30/21    Plan:Telephone follow up appointment with care management team member scheduled for:  06/30/21  Barb Merino, RN, BSN, CCM Care Management Coordinator [place] [facility]  Direct Phone: [phone]

## 2021-04-21 NOTE — Patient Instructions (Signed)
Goals Addressed      Track and Manage My Blood Pressure-Hypertension   On track    Timeframe:  Long-Range Goal Priority:  High Start Date:  04/14/21                           Expected End Date:  04/14/22                     Follow Up Date 06/30/21    Self administers medications as prescribed Attends all scheduled provider appointments Calls provider office for new concerns, questions, or BP outside discussed parameters Checks BP and records as discussed Follows a low sodium diet/DASH diet Patient Goals: - check blood pressure daily - write blood pressure results in a log or diary - learn about high blood pressure    Why is this important?   You won't feel high blood pressure, but it can still hurt your blood vessels.  High blood pressure can cause heart or kidney problems. It can also cause a stroke.  Making lifestyle changes like losing a [place] weight or eating less salt will help.  Checking your blood pressure at home and at different times of the day can help to control blood pressure.  If the doctor prescribes medicine remember to take it the way the doctor ordered.  Call the office if you cannot afford the medicine or if there are questions about it.     Notes:

## 2021-04-23 ENCOUNTER — Ambulatory Visit: Payer: Medicare Other | Admitting: Internal Medicine

## 2021-05-07 ENCOUNTER — Telehealth: Payer: Medicare Other

## 2021-05-07 ENCOUNTER — Telehealth: Payer: Self-pay

## 2021-05-07 NOTE — Telephone Encounter (Signed)
  Care Management   Follow Up Note   05/07/2021 Name: Erica Meadows MRN: [id number] DOB: [birth year]   Referred by: Glendale Chard, MD Reason for referral : Chronic Care Management (Unsuccessful call)   An unsuccessful telephone outreach was attempted today. The patient was referred to the case management team for assistance with care management and care coordination. SW left a HIPAA compliant voice message requesting a return call.  Follow Up Plan: The care management team will reach out to the patient again over the next 14 days.   Daneen Schick, BSW, CDP Social Worker, Certified Dementia Practitioner [place] [facility] [place] [facility] [phone]

## 2021-05-08 ENCOUNTER — Ambulatory Visit (INDEPENDENT_AMBULATORY_CARE_PROVIDER_SITE_OTHER): Payer: Medicare Other

## 2021-05-08 DIAGNOSIS — I10 Essential (primary) hypertension: Secondary | ICD-10-CM

## 2021-05-08 DIAGNOSIS — R7309 Other abnormal glucose: Secondary | ICD-10-CM

## 2021-05-08 NOTE — Patient Instructions (Signed)
Social Worker Visit Information  Goals we discussed today:   Goals Addressed             This Visit's Progress    Obtain a better understanding of what an Advance Directive is       Timeframe:  Long-Range Goal Priority:  Low Start Date:  8.4.22                           Expected End Date: 11.2.22                       Next planned outreach: 9.13.22  Patient Goals/Self-Care Activities patient will:   - Review mailed resource  -Contact SW as needed prior to next scheduled call         Materials Provided: Yes: verbal and written education re: Advance Directives  Follow Up Plan: SW will follow up with patient by phone over the next 45 days  Daneen Schick, BSW, CDP Social Worker, Certified Dementia Practitioner [place] [facility] [place] [facility] [phone]

## 2021-05-08 NOTE — Chronic Care Management (AMB) (Signed)
Chronic Care Management    Social Work Note  05/08/2021 Name: Erica Meadows MRN: [id number] DOB: 05/27/1936  Erica Meadows is a 85 y.o. year old female who is a primary care patient of Glendale Chard, MD. The CCM team was consulted to assist the patient with chronic disease management and/or care coordination needs related to:  HTN and Abnormal Glucose .   Engaged with patient by telephone for  Initial SW visit  in response to provider referral for social work chronic care management and care coordination services.   Consent to Services:  The patient was given information about Chronic Care Management services, agreed to services, and gave verbal consent prior to initiation of services.  Please see initial visit note for detailed documentation.   Patient agreed to services and consent obtained.   Assessment: Review of patient past medical history, allergies, medications, and health status, including review of relevant consultants reports was performed today as part of a comprehensive evaluation and provision of chronic care management and care coordination services.     SDOH (Social Determinants of Health) assessments and interventions performed:  SDOH Interventions    Flowsheet Row Most Recent Value  SDOH Interventions   Food Insecurity Interventions Intervention Not Indicated  Housing Interventions Intervention Not Indicated  Transportation Interventions Intervention Not Indicated        Advanced Directives Status: See Care Plan for related entries.  CCM Care Plan  No Known Allergies  Outpatient Encounter Medications as of 05/08/2021  Medication Sig   acetaminophen (TYLENOL) 500 MG tablet Take 500 mg by mouth every 6 (six) hours as needed. prn   amLODipine (NORVASC) 10 MG tablet TAKE 1 TABLET BY MOUTH EVERY DAY   aspirin 81 MG tablet Take 81 mg by mouth every evening.    calcium carbonate (OSCAL) 1500 (600 Ca) MG TABS tablet Take by mouth 2 (two) times daily with a meal.  1 tablet sometimes   carvedilol (COREG) 12.5 MG tablet TAKE 1 TABLET BY MOUTH TWICE A DAY WITH FOOD   cholecalciferol (VITAMIN D) 1000 units tablet Take 5,000 Units by mouth daily.    furosemide (LASIX) 20 MG tablet TAKE 1 TABLET BY MOUTH EVERY DAY   KLOR-CON M20 20 MEQ tablet TAKE 1 TABLET BY MOUTH EVERY DAY   losartan-hydrochlorothiazide (HYZAAR) 100-12.5 MG tablet TAKE 1 TABLET BY MOUTH EVERY DAY   rosuvastatin (CRESTOR) 10 MG tablet TAKE 1 TABLET BY MOUTH EVERY DAY   No facility-administered encounter medications on file as of 05/08/2021.    Patient Active Problem List   Diagnosis Date Noted   Vaginal discharge 11/01/2018   Essential hypertension 04/01/2018   Hyperlipidemia 04/01/2018   Pain and swelling of left lower extremity 04/01/2018    Conditions to be addressed/monitored: HTN and Abnormal Glucose ;  Limited knowledge of the importance of an Advance Directive  Care Plan : [name]  Updates made by Daneen Schick since 05/08/2021 12:00 AM     Problem: Quality of Life (General Plan of Care)      Long-Range Goal: Obtain a better understanding of what an Advance Directive is   Start Date: 05/08/2021  Expected End Date: 08/06/2021  Priority: Low  Note:   Current Barriers:  Chronic disease management support and education needs related to HTN and Abnormal Glucose   Limited knowledge of the importance of an Advance Directive  Social Worker Clinical Goal(s):  patient will work with SW to identify and address any acute and/or chronic care coordination  needs related to the self health management of HTN and Abnormal Glucose   Patient will work with SW to gain a better understanding of Advance Directives  SW Interventions:  Inter-disciplinary care team collaboration (see longitudinal plan of care) Collaboration with Glendale Chard, MD regarding development and update of comprehensive plan of care as evidenced by provider attestation and co-signature Successful outbound  call placed to the patient to conduct an [facility]Do[facility] screen - no acute challenges at this time Determined the patient does not have an Advance Directive  Provided verbal education about what an Advance Directive is and how to complete  Discussed the patient is interested in reviewing an Advance Directive to determine if this is something she would like to complete  Mailed an Advance Directive packet to the home Advised the patient SW would follow up over the next 45 days to assist with completion if desired Performed chart review to note patient has [facility] Medicare Provided brief education on how to use the [employer] transportation benefit should there be a need in the future Scheduled follow up call over the next 45 days  Patient Goals/Self-Care Activities patient will:   -  Review mailed resource  -Contact SW as needed prior to next scheduled call  Follow Up Plan: Telephone follow up appointment with care management team member scheduled for:9.13.22       Follow Up Plan: SW will follow up with patient by phone over the next 45 days      Daneen Schick, BSW, CDP Social Worker, Certified Dementia Practitioner [place] [facility] [place] [facility] [phone]

## 2021-05-14 ENCOUNTER — Telehealth: Payer: Medicare Other

## 2021-06-17 ENCOUNTER — Ambulatory Visit (INDEPENDENT_AMBULATORY_CARE_PROVIDER_SITE_OTHER): Payer: Medicare Other

## 2021-06-17 DIAGNOSIS — R7309 Other abnormal glucose: Secondary | ICD-10-CM

## 2021-06-17 DIAGNOSIS — I10 Essential (primary) hypertension: Secondary | ICD-10-CM

## 2021-06-17 NOTE — Chronic Care Management (AMB) (Signed)
Chronic Care Management    Social Work Note  06/17/2021 Name: Erica Meadows MRN: [id number] DOB: 1935/12/15  Erica Meadows is a 85 y.o. year old female who is a primary care patient of Glendale Chard, MD. The CCM team was consulted to assist the patient with chronic disease management and/or care coordination needs related to:  HTN and abnormal glucose .   Engaged with patient by telephone for follow up visit in response to provider referral for social work chronic care management and care coordination services.   Consent to Services:  The patient was given information about Chronic Care Management services, agreed to services, and gave verbal consent prior to initiation of services.  Please see initial visit note for detailed documentation.   Patient agreed to services and consent obtained.   Assessment: Review of patient past medical history, allergies, medications, and health status, including review of relevant consultants reports was performed today as part of a comprehensive evaluation and provision of chronic care management and care coordination services.     SDOH (Social Determinants of Health) assessments and interventions performed:    Advanced Directives Status:  SW provided the patient with an advance directive packet. Patient is not ready to complete at this time.  CCM Care Plan  No Known Allergies  Outpatient Encounter Medications as of 06/17/2021  Medication Sig   acetaminophen (TYLENOL) 500 MG tablet Take 500 mg by mouth every 6 (six) hours as needed. prn   amLODipine (NORVASC) 10 MG tablet TAKE 1 TABLET BY MOUTH EVERY DAY   aspirin 81 MG tablet Take 81 mg by mouth every evening.    calcium carbonate (OSCAL) 1500 (600 Ca) MG TABS tablet Take by mouth 2 (two) times daily with a meal. 1 tablet sometimes   carvedilol (COREG) 12.5 MG tablet TAKE 1 TABLET BY MOUTH TWICE A DAY WITH FOOD   cholecalciferol (VITAMIN D) 1000 units tablet Take 5,000 Units by mouth daily.     furosemide (LASIX) 20 MG tablet TAKE 1 TABLET BY MOUTH EVERY DAY   KLOR-CON M20 20 MEQ tablet TAKE 1 TABLET BY MOUTH EVERY DAY   losartan-hydrochlorothiazide (HYZAAR) 100-12.5 MG tablet TAKE 1 TABLET BY MOUTH EVERY DAY   rosuvastatin (CRESTOR) 10 MG tablet TAKE 1 TABLET BY MOUTH EVERY DAY   No facility-administered encounter medications on file as of 06/17/2021.    Patient Active Problem List   Diagnosis Date Noted   Vaginal discharge 11/01/2018   Essential hypertension 04/01/2018   Hyperlipidemia 04/01/2018   Pain and swelling of left lower extremity 04/01/2018    Conditions to be addressed/monitored: HTN and abnormal glucose  Care Plan : [place]  Updates made by Daneen Schick since 06/17/2021 12:00 AM     Problem: Quality of Life (General Plan of Care)      Long-Range Goal: Obtain a better understanding of what an Advance Directive is Completed 06/17/2021  Start Date: 05/08/2021  Expected End Date: 08/06/2021  Priority: Low  Note:   Current Barriers:  Chronic disease management support and education needs related to HTN and Abnormal Glucose   Limited knowledge of the importance of an Advance Directive  Social Worker Clinical Goal(s):  patient will work with SW to identify and address any acute and/or chronic care coordination needs related to the self health management of HTN and Abnormal Glucose   Patient will work with SW to gain a better understanding of Advance Directives  SW Interventions:  Inter-disciplinary care team collaboration (see longitudinal  plan of care) Collaboration with Glendale Chard, MD regarding development and update of comprehensive plan of care as evidenced by provider attestation and co-signature Successful outbound call placed to the patient to assess goal progression Confirmed receipt of mailed advance directive packet Assessed for patient understanding of document - patient reports she did look at the document when it was received but  did not complete the forms Advised SW is available to assist with completion - patient is not interested in completion during today's call but took SW contact information should she need assistance in the future  Patient Goals/Self-Care Activities patient will:  -Contact SW as needed for assistance with completion on advance directive        Follow Up Plan: No SW follow up planned at this time. The patient has been encouraged to contact SW as needed. Patient will remain engaged with RN Care Manager.      Daneen Schick, BSW, CDP Social Worker, Certified Dementia Practitioner [place] [facility] [place] [facility] [phone]

## 2021-06-17 NOTE — Patient Instructions (Signed)
Social Worker Visit Information  Goals we discussed today:   Goals Addressed             This Visit's Progress    COMPLETED: Obtain a better understanding of what an Advance Directive is       Timeframe:  Long-Range Goal Priority:  Low Start Date:  8.4.22                           Expected End Date: 11.2.22                       Patient Goals/Self-Care Activities patient will:  -Contact SW as needed for assistance with completion on advance directive         Materials Provided: No: Patient declined  Follow Up Plan:  No SW follow up planned at this time. Please contact me as needed.   Daneen Schick, BSW, CDP Social Worker, Certified Dementia Practitioner [place] [facility] [place] [facility] [phone]

## 2021-06-24 ENCOUNTER — Other Ambulatory Visit: Payer: Self-pay | Admitting: Internal Medicine

## 2021-06-26 ENCOUNTER — Other Ambulatory Visit: Payer: Self-pay

## 2021-06-26 ENCOUNTER — Encounter: Payer: Self-pay | Admitting: Internal Medicine

## 2021-06-26 ENCOUNTER — Ambulatory Visit (INDEPENDENT_AMBULATORY_CARE_PROVIDER_SITE_OTHER): Payer: Medicare Other | Admitting: Internal Medicine

## 2021-06-26 VITALS — BP 138/66 | HR 76 | Temp 98.9°F | Ht 60.2 in | Wt 137.2 lb

## 2021-06-26 DIAGNOSIS — I10 Essential (primary) hypertension: Secondary | ICD-10-CM | POA: Diagnosis not present

## 2021-06-26 DIAGNOSIS — R7309 Other abnormal glucose: Secondary | ICD-10-CM | POA: Diagnosis not present

## 2021-06-26 DIAGNOSIS — Z2821 Immunization not carried out because of patient refusal: Secondary | ICD-10-CM

## 2021-06-26 DIAGNOSIS — Z0000 Encounter for general adult medical examination without abnormal findings: Secondary | ICD-10-CM | POA: Diagnosis not present

## 2021-06-26 DIAGNOSIS — Z79899 Other long term (current) drug therapy: Secondary | ICD-10-CM

## 2021-06-26 DIAGNOSIS — E7800 Pure hypercholesterolemia, unspecified: Secondary | ICD-10-CM

## 2021-06-26 DIAGNOSIS — M8588 Other specified disorders of bone density and structure, other site: Secondary | ICD-10-CM

## 2021-06-26 DIAGNOSIS — R600 Localized edema: Secondary | ICD-10-CM

## 2021-06-26 LAB — POCT URINALYSIS DIPSTICK
Bilirubin, UA: NEGATIVE
Blood, UA: NEGATIVE
Glucose, UA: NEGATIVE
Ketones, UA: NEGATIVE
Leukocytes, UA: NEGATIVE
Nitrite, UA: NEGATIVE
Protein, UA: NEGATIVE
Spec Grav, UA: 1.015 (ref 1.010–1.025)
Urobilinogen, UA: 1 E.U./dL
pH, UA: 5.5 (ref 5.0–8.0)

## 2021-06-26 LAB — POCT UA - MICROALBUMIN
Albumin/Creatinine Ratio, Urine, POC: <30
Creatinine, POC: 100 mg/dL
Microalbumin Ur, POC: 10 mg/L

## 2021-06-26 NOTE — Progress Notes (Signed)
[name],acting as a Education administrator for [name], MD.,have documented all relevant documentation on the behalf of [name], MD,as directed by  [name], MD while in the presence of [name], MD.  This visit occurred during the SARS-CoV-2 public health emergency.  Safety protocols were in place, including screening questions prior to the visit, additional usage of staff PPE, and extensive cleaning of exam room while observing appropriate contact time as indicated for disinfecting solutions.  Subjective:     Patient ID: Erica Meadows , female    DOB: [birth year] , 85 y.o.   MRN: [id number]   Chief Complaint  Patient presents with   Annual Exam   Hypertension    HPI  Patient presents today for her HM. She is no longer followed by GYN. She reports compliance with meds. She denies headaches, chest pain and shortness of breath.   Hypertension This is a chronic problem. The current episode started more than 1 year ago. The problem has been gradually improving since onset. The problem is controlled. Risk factors for coronary artery disease include post-menopausal state and sedentary lifestyle. The current treatment provides moderate improvement.    Past Medical History:  Diagnosis Date   High cholesterol    Hyperparathyroidism ([place])    Hypertension    under control; has been on med. x 15-20 yr.   Seasonal allergies    Trigger thumb of left hand 12/2012     Family History  Problem Relation Age of Onset   Hypertension Mother    Stroke Mother    Stroke Father    Hypertension Father      Current Outpatient Medications:    acetaminophen (TYLENOL) 500 MG tablet, Take 500 mg by mouth every 6 (six) hours as needed. prn, Disp: , Rfl:    amLODipine (NORVASC) 10 MG tablet, TAKE 1 TABLET BY MOUTH EVERY DAY, Disp: 90 tablet, Rfl: 1   aspirin 81 MG tablet, Take 81 mg by mouth every evening. , Disp: , Rfl:    carvedilol (COREG) 12.5 MG tablet, TAKE 1 TABLET BY  MOUTH TWICE A DAY WITH FOOD, Disp: 180 tablet, Rfl: 1   cholecalciferol (VITAMIN D) 1000 units tablet, Take 5,000 Units by mouth daily. , Disp: , Rfl:    furosemide (LASIX) 20 MG tablet, TAKE 1 TABLET BY MOUTH EVERY DAY, Disp: 90 tablet, Rfl: 1   losartan-hydrochlorothiazide (HYZAAR) 100-12.5 MG tablet, TAKE 1 TABLET BY MOUTH EVERY DAY, Disp: 90 tablet, Rfl: 1   rosuvastatin (CRESTOR) 10 MG tablet, TAKE 1 TABLET BY MOUTH EVERY DAY, Disp: 90 tablet, Rfl: 1   KLOR-CON M20 20 MEQ tablet, TAKE 1 TABLET BY MOUTH EVERY DAY, Disp: 90 tablet, Rfl: 1   No Known Allergies    The patient states she uses post menopausal status for birth control. Last LMP was No LMP recorded. Patient has had a hysterectomy.. Negative for Dysmenorrhea. Negative for: breast discharge, breast lump(s), breast pain and breast self exam. Associated symptoms include abnormal vaginal bleeding. Pertinent negatives include abnormal bleeding (hematology), anxiety, decreased libido, depression, difficulty falling sleep, dyspareunia, history of infertility, nocturia, sexual dysfunction, sleep disturbances, urinary incontinence, urinary urgency, vaginal discharge and vaginal itching. Diet regular.The patient states her exercise level is  "a little 3-4 times per week"  . The patient's tobacco use is:  Social History   Tobacco Use  Smoking Status Never  Smokeless Tobacco Never  . She has been exposed to passive smoke. The patient's alcohol use is:  Social History   Substance and Sexual Activity  Alcohol Use No   Review of Systems  Constitutional: Negative.   HENT: Negative.    Eyes: Negative.   Respiratory: Negative.    Cardiovascular: Negative.   Gastrointestinal: Negative.   Endocrine: Negative.   Genitourinary: Negative.   Musculoskeletal: Negative.   Skin: Negative.   Allergic/Immunologic: Negative.   Neurological: Negative.   Hematological: Negative.   Psychiatric/Behavioral: Negative.      Today's Vitals   06/26/21  1439  BP: 138/66  Pulse: 76  Temp: 98.9 F (37.2 C)  Weight: 137 lb 3.2 oz (62.2 kg)  Height: 5' 0.2" (1.529 m)  PainSc: 0-No pain   Body mass index is 26.62 kg/m.   Objective:  Physical Exam Vitals and nursing note reviewed.  Constitutional:      Appearance: Normal appearance.  HENT:     Head: Normocephalic and atraumatic.     Right Ear: Tympanic membrane, ear canal and external ear normal.     Left Ear: Tympanic membrane, ear canal and external ear normal.     Nose:     Comments: Masked     Mouth/Throat:     Comments: Masked  Eyes:     Extraocular Movements: Extraocular movements intact.     Conjunctiva/sclera: Conjunctivae normal.     Pupils: Pupils are equal, round, and reactive to light.  Cardiovascular:     Rate and Rhythm: Normal rate and regular rhythm.     Pulses: Normal pulses.     Heart sounds: Normal heart sounds.  Pulmonary:     Effort: Pulmonary effort is normal.     Breath sounds: Normal breath sounds.  Chest:  Breasts:    Tanner Score is 5.     Right: Normal.     Left: Normal.  Abdominal:     General: Abdomen is flat. Bowel sounds are normal.     Palpations: Abdomen is soft.  Genitourinary:    Comments: deferred Musculoskeletal:        General: Normal range of motion.     Cervical back: Normal range of motion and neck supple.  Skin:    General: Skin is warm and dry.  Neurological:     General: No focal deficit present.     Mental Status: She is alert and oriented to person, place, and time.  Psychiatric:        Mood and Affect: Mood normal.        Behavior: Behavior normal.        Assessment And Plan:     1. Encounter for general adult medical examination w/o abnormal findings Comments: A full exam was performed.  Importance of monthly self breast exams was discussed with the patient. PATIENT IS ADVISED TO GET 30-45 MINUTES REGULAR EXERCISE NO LESS THAN FOUR TO FIVE DAYS PER WEEK - BOTH WEIGHTBEARING EXERCISES AND AEROBIC ARE RECOMMENDED.   PATIENT IS ADVISED TO FOLLOW A HEALTHY DIET WITH AT LEAST SIX FRUITS/VEGGIES PER DAY, DECREASE INTAKE OF RED MEAT, AND TO INCREASE FISH INTAKE TO TWO DAYS PER WEEK.  MEATS/FISH SHOULD NOT BE FRIED, BAKED OR BROILED IS PREFERABLE.  IT IS ALSO IMPORTANT TO CUT BACK ON YOUR SUGAR INTAKE. PLEASE AVOID ANYTHING WITH ADDED SUGAR, CORN SYRUP OR OTHER SWEETENERS. IF YOU MUST USE A SWEETENER, YOU CAN TRY STEVIA. IT IS ALSO IMPORTANT TO AVOID ARTIFICIALLY SWEETENERS AND DIET BEVERAGES. LASTLY, I SUGGEST WEARING SPF 50 SUNSCREEN ON EXPOSED PARTS AND ESPECIALLY WHEN IN THE DIRECT SUNLIGHT FOR AN  EXTENDED PERIOD OF TIME.  PLEASE AVOID FAST FOOD RESTAURANTS AND INCREASE YOUR WATER INTAKE.  2. Essential hypertension Comments: Chronic, fair control. Admits she has been taking BP meds based on her evening BP reading. EKG performed, SB w/o acute changes. Pt advised to take daily as prescribed. She agrees to f/u in 2 wks. - POCT Urinalysis Dipstick (81002) - POCT UA - Microalbumin - EKG 12-Lead - CMP14+EGFR  3. Pure hypercholesterolemia Comments: Chronic, she has been taking rosuvastatin w/o any issues. I will check lipid panelLFTs today. She will f/u in six months for re-evaluation.  - Lipid panel  4. Other abnormal glucose Comments: Her a1c/blood sugar has been elevated in the past. I will recheck this today. Encouraged to limit her intake of sweetened beverages.  - Hemoglobin A1c  5. Lower extremity edema Comments: We discussed this is common side effect from amlodipine. She is advised to wear compression hose when she goes out shopping, etc.   6. Osteopenia of lumbar spine Comments: I will refer her to [facility] for bone density. I will try to schedule at [facility] same day as mammogram if possible. Advised to c/w vit d supplementation and weightbearing exercises at least 3 days weekly.   7. Encounter for long-term current use of medication - CBC  8. Influenza vaccination declined  Patient was given  opportunity to ask questions. Patient verbalized understanding of the plan and was able to repeat key elements of the plan. All questions were answered to their satisfaction.   I, [name], MD, have reviewed all documentation for this visit. The documentation on 07/14/21 for the exam, diagnosis, procedures, and orders are all accurate and complete.   THE PATIENT IS ENCOURAGED TO PRACTICE SOCIAL DISTANCING DUE TO THE COVID-19 PANDEMIC.

## 2021-06-26 NOTE — Patient Instructions (Signed)
Health Maintenance, Female Adopting a healthy lifestyle and getting preventive care are important in promoting health and wellness. Ask your health care provider about: The right schedule for you to have regular tests and exams. Things you can do on your own to prevent diseases and keep yourself healthy. What should I know about diet, weight, and exercise? Eat a healthy diet  Eat a diet that includes plenty of vegetables, fruits, low-fat dairy products, and lean protein. Do not eat a lot of foods that are high in solid fats, added sugars, or sodium. Maintain a healthy weight Body mass index (BMI) is used to identify weight problems. It estimates body fat based on height and weight. Your health care provider can help determine your BMI and help you achieve or maintain a healthy weight. Get regular exercise Get regular exercise. This is one of the most important things you can do for your health. Most adults should: Exercise for at least 150 minutes each week. The exercise should increase your heart rate and make you sweat (moderate-intensity exercise). Do strengthening exercises at least twice a week. This is in addition to the moderate-intensity exercise. Spend less time sitting. Even light physical activity can be beneficial. Watch cholesterol and blood lipids Have your blood tested for lipids and cholesterol at 83 years of age, then have this test every 5 years. Have your cholesterol levels checked more often if: Your lipid or cholesterol levels are high. You are older than 83 years of age. You are at high risk for heart disease. What should I know about cancer screening? Depending on your health history and family history, you may need to have cancer screening at various ages. This may include screening for: Breast cancer. Cervical cancer. Colorectal cancer. Skin cancer. Lung cancer. What should I know about heart disease, diabetes, and high blood pressure? Blood pressure and heart  disease High blood pressure causes heart disease and increases the risk of stroke. This is more likely to develop in people who have high blood pressure readings, are of African descent, or are overweight. Have your blood pressure checked: Every 3-5 years if you are 18-39 years of age. Every year if you are 40 years old or older. Diabetes Have regular diabetes screenings. This checks your fasting blood sugar level. Have the screening done: Once every three years after age 40 if you are at a normal weight and have a low risk for diabetes. More often and at a younger age if you are overweight or have a high risk for diabetes. What should I know about preventing infection? Hepatitis B If you have a higher risk for hepatitis B, you should be screened for this virus. Talk with your health care provider to find out if you are at risk for hepatitis B infection. Hepatitis C Testing is recommended for: Everyone born from 1945 through 1965. Anyone with known risk factors for hepatitis C. Sexually transmitted infections (STIs) Get screened for STIs, including gonorrhea and chlamydia, if: You are sexually active and are younger than 83 years of age. You are older than 83 years of age and your health care provider tells you that you are at risk for this type of infection. Your sexual activity has changed since you were last screened, and you are at increased risk for chlamydia or gonorrhea. Ask your health care provider if you are at risk. Ask your health care provider about whether you are at high risk for HIV. Your health care provider may recommend a prescription medicine   to help prevent HIV infection. If you choose to take medicine to prevent HIV, you should first get tested for HIV. You should then be tested every 3 months for as long as you are taking the medicine. Pregnancy If you are about to stop having your period (premenopausal) and you may become pregnant, seek counseling before you get  pregnant. Take 400 to 800 micrograms (mcg) of folic acid every day if you become pregnant. Ask for birth control (contraception) if you want to prevent pregnancy. Osteoporosis and menopause Osteoporosis is a disease in which the bones lose minerals and strength with aging. This can result in bone fractures. If you are 65 years old or older, or if you are at risk for osteoporosis and fractures, ask your health care provider if you should: Be screened for bone loss. Take a calcium or vitamin D supplement to lower your risk of fractures. Be given hormone replacement therapy (HRT) to treat symptoms of menopause. Follow these instructions at home: Lifestyle Do not use any products that contain nicotine or tobacco, such as cigarettes, e-cigarettes, and chewing tobacco. If you need help quitting, ask your health care provider. Do not use street drugs. Do not share needles. Ask your health care provider for help if you need support or information about quitting drugs. Alcohol use Do not drink alcohol if: Your health care provider tells you not to drink. You are pregnant, may be pregnant, or are planning to become pregnant. If you drink alcohol: Limit how much you use to 0-1 drink a day. Limit intake if you are breastfeeding. Be aware of how much alcohol is in your drink. In the [place] one drink equals one 12 oz bottle of beer (355 mL), one 5 oz glass of wine (148 mL), or one 1 oz glass of hard liquor (44 mL). General instructions Schedule regular health, dental, and eye exams. Stay current with your vaccines. Tell your health care provider if: You often feel depressed. You have ever been abused or do not feel safe at home. Summary Adopting a healthy lifestyle and getting preventive care are important in promoting health and wellness. Follow your health care provider's instructions about healthy diet, exercising, and getting tested or screened for diseases. Follow your health care provider's  instructions on monitoring your cholesterol and blood pressure. This information is not intended to replace advice given to you by your health care provider. Make sure you discuss any questions you have with your health care provider. Document Revised: 11/29/2020 Document Reviewed: 09/14/2018 [employer] Patient Education  2022 [employer]  

## 2021-06-27 LAB — CMP14+EGFR
ALT: 5 IU/L (ref 0–32)
AST: 18 IU/L (ref 0–40)
Albumin/Globulin Ratio: 1.8 (ref 1.2–2.2)
Albumin: 4.6 g/dL (ref 3.6–4.6)
Alkaline Phosphatase: 82 IU/L (ref 44–121)
BUN/Creatinine Ratio: 16 (ref 12–28)
BUN: 12 mg/dL (ref 8–27)
Bilirubin Total: 0.4 mg/dL (ref 0.0–1.2)
CO2: 28 mmol/L (ref 20–29)
Calcium: 10.6 mg/dL — ABNORMAL HIGH (ref 8.7–10.3)
Chloride: 101 mmol/L (ref 96–106)
Creatinine, Ser: 0.76 mg/dL (ref 0.57–1.00)
Globulin, Total: 2.5 g/dL (ref 1.5–4.5)
Glucose: 103 mg/dL — ABNORMAL HIGH (ref 65–99)
Potassium: 3.7 mmol/L (ref 3.5–5.2)
Sodium: 144 mmol/L (ref 134–144)
Total Protein: 7.1 g/dL (ref 6.0–8.5)
eGFR: 78 mL/min/{1.73_m2} (ref >59)

## 2021-06-27 LAB — CBC
Hematocrit: 37.1 % (ref 34.0–46.6)
Hemoglobin: 12.4 g/dL (ref 11.1–15.9)
MCH: 30.4 pg (ref 26.6–33.0)
MCHC: 33.4 g/dL (ref 31.5–35.7)
MCV: 91 fL (ref 79–97)
Platelets: 248 10*3/uL (ref 150–450)
RBC: 4.08 x10E6/uL (ref 3.77–5.28)
RDW: 12.7 % (ref 11.7–15.4)
WBC: 5.2 10*3/uL (ref 3.4–10.8)

## 2021-06-27 LAB — LIPID PANEL
Chol/HDL Ratio: 2.4 ratio (ref 0.0–4.4)
Cholesterol, Total: 158 mg/dL (ref 100–199)
HDL: 67 mg/dL (ref >39)
LDL Chol Calc (NIH): 75 mg/dL (ref 0–99)
Triglycerides: 83 mg/dL (ref 0–149)
VLDL Cholesterol Cal: 16 mg/dL (ref 5–40)

## 2021-06-27 LAB — HEMOGLOBIN A1C
Est. average glucose Bld gHb Est-mCnc: 128 mg/dL
Hgb A1c MFr Bld: 6.1 % — ABNORMAL HIGH (ref 4.8–5.6)

## 2021-06-30 ENCOUNTER — Telehealth: Payer: Medicare Other

## 2021-06-30 ENCOUNTER — Ambulatory Visit: Payer: Self-pay

## 2021-06-30 DIAGNOSIS — M8588 Other specified disorders of bone density and structure, other site: Secondary | ICD-10-CM | POA: Insufficient documentation

## 2021-06-30 DIAGNOSIS — I10 Essential (primary) hypertension: Secondary | ICD-10-CM

## 2021-06-30 DIAGNOSIS — R7309 Other abnormal glucose: Secondary | ICD-10-CM

## 2021-06-30 DIAGNOSIS — R600 Localized edema: Secondary | ICD-10-CM | POA: Insufficient documentation

## 2021-07-03 ENCOUNTER — Ambulatory Visit (INDEPENDENT_AMBULATORY_CARE_PROVIDER_SITE_OTHER): Payer: Medicare Other

## 2021-07-03 ENCOUNTER — Other Ambulatory Visit: Payer: Self-pay

## 2021-07-03 VITALS — BP 122/58 | HR 64 | Temp 98.6°F | Ht 59.2 in | Wt 139.2 lb

## 2021-07-03 DIAGNOSIS — Z0000 Encounter for general adult medical examination without abnormal findings: Secondary | ICD-10-CM

## 2021-07-03 DIAGNOSIS — Z23 Encounter for immunization: Secondary | ICD-10-CM | POA: Diagnosis not present

## 2021-07-03 LAB — VITAMIN D 25 HYDROXY (VIT D DEFICIENCY, FRACTURES): Vit D, 25-Hydroxy: 87.2 ng/mL (ref 30.0–100.0)

## 2021-07-03 LAB — SPECIMEN STATUS REPORT

## 2021-07-03 NOTE — Progress Notes (Signed)
This visit occurred during the SARS-CoV-2 public health emergency.  Safety protocols were in place, including screening questions prior to the visit, additional usage of staff PPE, and extensive cleaning of exam room while observing appropriate contact time as indicated for disinfecting solutions.  Subjective:   Erica Meadows is a 85 y.o. female who presents for Medicare Annual (Subsequent) preventive examination.  Review of Systems     Cardiac Risk Factors include: advanced age (>85men, >41 women);dyslipidemia;hypertension;sedentary lifestyle     Objective:    Today's Vitals   07/03/21 1402  BP: (!) 122/58  Pulse: 64  Temp: 98.6 F (37 C)  TempSrc: Oral  SpO2: 97%  Weight: 139 lb 3.2 oz (63.1 kg)  Height: 4' 11.2" (1.504 m)   Body mass index is 27.93 kg/m.  Advanced Directives 07/03/2021 05/08/2021 06/19/2020 06/14/2019 08/11/2018 06/16/2018 06/13/2018  Does Patient Have a Medical Advance Directive? No No No No No No No  Would patient like information on creating a medical advance directive? No - Patient declined Yes (MAU/Ambulatory/Procedural Areas - Information given) No - Patient declined - No - Patient declined No - Patient declined Yes (MAU/Ambulatory/Procedural Areas - Information given)    Current Medications (verified) Outpatient Encounter Medications as of 07/03/2021  Medication Sig   acetaminophen (TYLENOL) 500 MG tablet Take 500 mg by mouth every 6 (six) hours as needed. prn   amLODipine (NORVASC) 10 MG tablet TAKE 1 TABLET BY MOUTH EVERY DAY   aspirin 81 MG tablet Take 81 mg by mouth every evening.    carvedilol (COREG) 12.5 MG tablet TAKE 1 TABLET BY MOUTH TWICE A DAY WITH FOOD   cholecalciferol (VITAMIN D) 1000 units tablet Take 5,000 Units by mouth daily.    furosemide (LASIX) 20 MG tablet TAKE 1 TABLET BY MOUTH EVERY DAY   KLOR-CON M20 20 MEQ tablet TAKE 1 TABLET BY MOUTH EVERY DAY   losartan-hydrochlorothiazide (HYZAAR) 100-12.5 MG tablet TAKE 1 TABLET BY MOUTH  EVERY DAY   rosuvastatin (CRESTOR) 10 MG tablet TAKE 1 TABLET BY MOUTH EVERY DAY   calcium carbonate (OSCAL) 1500 (600 Ca) MG TABS tablet Take by mouth 2 (two) times daily with a meal. 1 tablet sometimes (Patient not taking: No sig reported)   No facility-administered encounter medications on file as of 07/03/2021.    Allergies (verified) Patient has no known allergies.   History: Past Medical History:  Diagnosis Date   High cholesterol    Hyperparathyroidism ([place])    Hypertension    under control; has been on med. x 15-20 yr.   Seasonal allergies    Trigger thumb of left hand 12/2012   Past Surgical History:  Procedure Laterality Date   ABDOMINAL HYSTERECTOMY  1975   partial   HAND  SURGERY  Left 03/2018   ANTERIOR  HAND ;  CUT INTO TENDON ON HAND WHILE CHOPPING CABBAGE ;      hysterectomy partial     PARATHYROIDECTOMY Right 06/16/2018   Procedure: RIGHT INFERIOR PARATHYROIDECTOMY;  Surgeon: [name] Gemma, MD;  Location: [facility];  Service: General;  Laterality: Right;   TRIGGER FINGER RELEASE Right    TRIGGER FINGER RELEASE Left 12/12/2012   Procedure: RELEASE TRIGGER FINGER/A-1 PULLEY LEFT THUMB;  Surgeon: Wynonia Sours, MD;  Location: [facility];  Service: Orthopedics;  Laterality: Left;  ANESTHESIA: IV REGIONAL FAB   Family History  Problem Relation Age of Onset   Hypertension Mother    Stroke Mother    Stroke Father  Hypertension Father    Social History   Socioeconomic History   Marital status: Married    Spouse name: Not on file   Number of children: Not on file   Years of education: Not on file   Highest education level: Not on file  Occupational History   Occupation: retired  Tobacco Use   Smoking status: Never   Smokeless tobacco: Never  Vaping Use   Vaping Use: Never used  Substance and Sexual Activity   Alcohol use: No   Drug use: No   Sexual activity: Not Currently  Other Topics Concern   Not on file  Social History Narrative   Not  on file   Social Determinants of Health   Financial Resource Strain: Low Risk    Difficulty of Paying Living Expenses: Not hard at all  Food Insecurity: No Food Insecurity   Worried About Charity fundraiser in the Last Year: Never true   [place] in the Last Year: Never true  Transportation Needs: No Transportation Needs   Lack of Transportation (Medical): No   Lack of Transportation (Non-Medical): No  Physical Activity: Inactive   Days of Exercise per Week: 0 days   Minutes of Exercise per Session: 0 min  Stress: No Stress Concern Present   Feeling of Stress : Not at all  Social Connections: Socially Integrated   Frequency of Communication with Friends and Family: More than three times a week   Frequency of Social Gatherings with Friends and Family: Twice a week   Attends Religious Services: More than 4 times per year   Active Member of [employer] or Organizations: Yes   Attends Archivist Meetings: 1 to 4 times per year   Marital Status: Married    Tobacco Counseling Counseling given: Not Answered   Clinical Intake:  Pre-visit preparation completed: Yes  Pain : No/denies pain     Nutritional Status: BMI 25 -29 Overweight Nutritional Risks: None Diabetes: No  How often do you need to have someone help you when you read instructions, pamphlets, or other written materials from your doctor or pharmacy?: 1 - Never What is the last grade level you completed in school?: 4yrs  Diabetic? no  Interpreter Needed?: No  Information entered by :: NAllen LPN   Activities of Daily Living In your present state of health, do you have any difficulty performing the following activities: 07/03/2021 06/26/2021  Hearing? N N  Vision? N N  Difficulty concentrating or making decisions? N N  Walking or climbing stairs? N N  Dressing or bathing? N N  Doing errands, shopping? N N  Preparing Food and eating ? N -  Using the Toilet? N -  In the past six months, have you  accidently leaked urine? N -  Do you have problems with loss of bowel control? N -  Managing your Medications? N -  Managing your Finances? N -  Housekeeping or managing your Housekeeping? N -  Some recent data might be hidden    Patient Care Team: Glendale Chard, MD as PCP - General (Internal Medicine) Rex Kras, Claudette Stapler, RN as [facility] Lavonna Monarch, MD as Consulting Physician (Dermatology)  Indicate any recent Medical Services you may have received from other than [facility] providers in the past year (date may be approximate).     Assessment:   This is a routine wellness examination for Nyela.  Hearing/Vision screen Vision Screening - Comments:: No regular eye exams,  Dietary issues and exercise activities discussed: Current Exercise Habits: The patient does not participate in regular exercise at present   Goals Addressed             This Visit's Progress    Patient Stated       07/03/2021, maintain weight and avoid diabetes       Depression Screen PHQ 2/9 Scores 07/03/2021 06/26/2021 06/19/2020 06/19/2020 06/14/2019 11/01/2018 08/11/2018  PHQ - 2 Score 0 0 0 0 0 0 0  PHQ- 9 Score - - - - - - 0    Fall Risk Fall Risk  07/03/2021 06/26/2021 06/19/2020 06/19/2020 06/14/2019  Falls in the past year? 0 0 0 0 0  Number falls in past yr: - 0 - - -  Injury with Fall? - 0 - - -  Risk for fall due to : Medication side effect - Medication side effect - Medication side effect  Follow up Falls evaluation completed;Education provided;Falls prevention discussed - Falls evaluation completed;Education provided;Falls prevention discussed - Falls evaluation completed;Education provided;Falls prevention discussed    FALL RISK PREVENTION PERTAINING TO THE HOME:  Any stairs in or around the home? Yes  If so, are there any without handrails? No  Home free of loose throw rugs in walkways, pet beds, electrical cords, etc? Yes  Adequate lighting in your home to reduce  risk of falls? Yes   ASSISTIVE DEVICES UTILIZED TO PREVENT FALLS:  Life alert? No  Use of a cane, walker or w/c? No  Grab bars in the bathroom? Yes  Shower chair or bench in shower? No  Elevated toilet seat or a handicapped toilet? Yes   TIMED UP AND GO:  Was the test performed? No .  .   Gait steady and fast without use of assistive device  Cognitive Function:     6CIT Screen 07/03/2021 06/19/2020 06/14/2019 08/11/2018  What Year? 0 points 0 points 0 points 0 points  What month? 0 points 0 points 0 points 0 points  What time? 0 points 0 points 0 points 0 points  Count back from 20 0 points 0 points 0 points 0 points  Months in reverse 0 points 0 points 0 points 0 points  Repeat phrase 0 points 0 points 0 points 0 points  Total Score 0 0 0 0    Immunizations Immunization History  Administered Date(s) Administered   Fluad Quad(high Dose 65+) 07/19/2019, 06/19/2020, 07/03/2021   Influenza, High Dose Seasonal PF 12/15/2018   Influenza-Unspecified 12/15/2018, 07/19/2019   PFIZER(Purple Top)SARS-COV-2 Vaccination 12/07/2019, 01/03/2020, 09/16/2020, 06/20/2021   Pfizer Covid-19 Vaccine Bivalent Booster 75yrs & up 06/20/2021   Tdap 03/03/2018   Zoster Recombinat (Shingrix) 05/06/2021    TDAP status: Up to date  Flu Vaccine status: Completed at today's visit  Pneumococcal vaccine status: Due, Education has been provided regarding the importance of this vaccine. Advised may receive this vaccine at local pharmacy or Health Dept. Aware to provide a copy of the vaccination record if obtained from local pharmacy or Health Dept. Verbalized acceptance and understanding.  Covid-19 vaccine status: Completed vaccines  Qualifies for Shingles Vaccine? Yes   Zostavax completed No   Shingrix Completed?: needs second dose  Screening Tests Health Maintenance  Topic Date Due   Zoster Vaccines- Shingrix (2 of 2) 07/01/2021   MAMMOGRAM  08/27/2021   COVID-19 Vaccine (5 - Booster for Pfizer  series) 10/20/2021   TETANUS/TDAP  03/03/2028   INFLUENZA VACCINE  Completed   DEXA SCAN  Completed  HPV VACCINES  Aged Out    Health Maintenance  Health Maintenance Due  Topic Date Due   Zoster Vaccines- Shingrix (2 of 2) 07/01/2021    Colorectal cancer screening: No longer required.   Mammogram status: Completed 08/27/2020. Repeat every year  Bone Density status: Completed 07/27/2019.   Lung Cancer Screening: (Low Dose CT Chest recommended if Age 91-80 years, 30 pack-year currently smoking OR have quit w/in 15years.) does not qualify.   Lung Cancer Screening Referral: no  Additional Screening:  Hepatitis C Screening: does not qualify;   Vision Screening: Recommended annual ophthalmology exams for early detection of glaucoma and other disorders of the eye. Is the patient up to date with their annual eye exam?  Yes  Who is the provider or what is the name of the office in which the patient attends annual eye exams? none If pt is not established with a provider, would they like to be referred to a provider to establish care? No .   Dental Screening: Recommended annual dental exams for proper oral hygiene  Community Resource Referral / Chronic Care Management: CRR required this visit?  No   CCM required this visit?  No      Plan:     I have personally reviewed and noted the following in the patient's chart:   Medical and social history Use of alcohol, tobacco or illicit drugs  Current medications and supplements including opioid prescriptions.  Functional ability and status Nutritional status Physical activity Advanced directives List of other physicians Hospitalizations, surgeries, and ER visits in previous 12 months Vitals Screenings to include cognitive, depression, and falls Referrals and appointments  In addition, I have reviewed and discussed with patient certain preventive protocols, quality metrics, and best practice recommendations. A written  personalized care plan for preventive services as well as general preventive health recommendations were provided to patient.     Kellie Simmering, LPN   11/18/863   Nurse Notes:

## 2021-07-03 NOTE — Patient Instructions (Signed)
Erica Meadows , Thank you for taking time to come for your Medicare Wellness Visit. I appreciate your ongoing commitment to your health goals. Please review the following plan we discussed and let me know if I can assist you in the future.   Screening recommendations/referrals: Colonoscopy: not required Mammogram: completed 08/27/2020 Bone Density: completed 07/27/2019 Recommended yearly ophthalmology/optometry visit for glaucoma screening and checkup Recommended yearly dental visit for hygiene and checkup  Vaccinations: Influenza vaccine: today Pneumococcal vaccine: due Tdap vaccine: completed 03/03/2018, due 03/03/2028 Shingles vaccine: needs second dose   Covid-19: 06/20/2021, 09/16/2020, 01/03/2020, 12/07/2019  Advanced directives: Advance directive discussed with you today. Even though you declined this today please call our office should you change your mind and we can give you the proper paperwork for you to fill out.  Conditions/risks identified: none  Next appointment: Follow up in one year for your annual wellness visit    Preventive Care 65 Years and Older, Female Preventive care refers to lifestyle choices and visits with your health care provider that can promote health and wellness. What does preventive care include? A yearly physical exam. This is also called an annual well check. Dental exams once or twice a year. Routine eye exams. Ask your health care provider how often you should have your eyes checked. Personal lifestyle choices, including: Daily care of your teeth and gums. Regular physical activity. Eating a healthy diet. Avoiding tobacco and drug use. Limiting alcohol use. Practicing safe sex. Taking low-dose aspirin every day. Taking vitamin and mineral supplements as recommended by your health care provider. What happens during an annual well check? The services and screenings done by your health care provider during your annual well check will depend on your  age, overall health, lifestyle risk factors, and family history of disease. Counseling  Your health care provider may ask you questions about your: Alcohol use. Tobacco use. Drug use. Emotional well-being. Home and relationship well-being. Sexual activity. Eating habits. History of falls. Memory and ability to understand (cognition). Work and work Statistician. Reproductive health. Screening  You may have the following tests or measurements: Height, weight, and BMI. Blood pressure. Lipid and cholesterol levels. These may be checked every 5 years, or more frequently if you are over 83 years old. Skin check. Lung cancer screening. You may have this screening every year starting at age 83 if you have a 30-pack-year history of smoking and currently smoke or have quit within the past 15 years. Fecal occult blood test (FOBT) of the stool. You may have this test every year starting at age 83. Flexible sigmoidoscopy or colonoscopy. You may have a sigmoidoscopy every 5 years or a colonoscopy every 10 years starting at age 65. Hepatitis C blood test. Hepatitis B blood test. Sexually transmitted disease (STD) testing. Diabetes screening. This is done by checking your blood sugar (glucose) after you have not eaten for a while (fasting). You may have this done every 1-3 years. Bone density scan. This is done to screen for osteoporosis. You may have this done starting at age 83. Mammogram. This may be done every 1-2 years. Talk to your health care provider about how often you should have regular mammograms. Talk with your health care provider about your test results, treatment options, and if necessary, the need for more tests. Vaccines  Your health care provider may recommend certain vaccines, such as: Influenza vaccine. This is recommended every year. Tetanus, diphtheria, and acellular pertussis (Tdap, Td) vaccine. You may need a Td booster every 10  years. Zoster vaccine. You may need this after  age 83. Pneumococcal 13-valent conjugate (PCV13) vaccine. One dose is recommended after age 32. Pneumococcal polysaccharide (PPSV23) vaccine. One dose is recommended after age 83. Talk to your health care provider about which screenings and vaccines you need and how often you need them. This information is not intended to replace advice given to you by your health care provider. Make sure you discuss any questions you have with your health care provider. Document Released: 10/18/2015 Document Revised: 06/10/2016 Document Reviewed: 07/23/2015 Elsevier Interactive Patient Education  2017 [place] Prevention in the Home Falls can cause injuries. They can happen to people of all ages. There are many things you can do to make your home safe and to help prevent falls. What can I do on the outside of my home? Regularly fix the edges of walkways and driveways and fix any cracks. Remove anything that might make you trip as you walk through a door, such as a raised step or threshold. Trim any bushes or trees on the path to your home. Use bright outdoor lighting. Clear any walking paths of anything that might make someone trip, such as rocks or tools. Regularly check to see if handrails are loose or broken. Make sure that both sides of any steps have handrails. Any raised decks and porches should have guardrails on the edges. Have any leaves, snow, or ice cleared regularly. Use sand or salt on walking paths during winter. Clean up any spills in your garage right away. This includes oil or grease spills. What can I do in the bathroom? Use night lights. Install grab bars by the toilet and in the tub and shower. Do not use towel bars as grab bars. Use non-skid mats or decals in the tub or shower. If you need to sit down in the shower, use a plastic, non-slip stool. Keep the floor dry. Clean up any water that spills on the floor as soon as it happens. Remove soap buildup in the tub or shower  regularly. Attach bath mats securely with double-sided non-slip rug tape. Do not have throw rugs and other things on the floor that can make you trip. What can I do in the bedroom? Use night lights. Make sure that you have a light by your bed that is easy to reach. Do not use any sheets or blankets that are too big for your bed. They should not hang down onto the floor. Have a firm chair that has side arms. You can use this for support while you get dressed. Do not have throw rugs and other things on the floor that can make you trip. What can I do in the kitchen? Clean up any spills right away. Avoid walking on wet floors. Keep items that you use a lot in easy-to-reach places. If you need to reach something above you, use a strong step stool that has a grab bar. Keep electrical cords out of the way. Do not use floor polish or wax that makes floors slippery. If you must use wax, use non-skid floor wax. Do not have throw rugs and other things on the floor that can make you trip. What can I do with my stairs? Do not leave any items on the stairs. Make sure that there are handrails on both sides of the stairs and use them. Fix handrails that are broken or loose. Make sure that handrails are as long as the stairways. Check any carpeting to make sure  that it is firmly attached to the stairs. Fix any carpet that is loose or worn. Avoid having throw rugs at the top or bottom of the stairs. If you do have throw rugs, attach them to the floor with carpet tape. Make sure that you have a light switch at the top of the stairs and the bottom of the stairs. If you do not have them, ask someone to add them for you. What else can I do to help prevent falls? Wear shoes that: Do not have high heels. Have rubber bottoms. Are comfortable and fit you well. Are closed at the toe. Do not wear sandals. If you use a stepladder: Make sure that it is fully opened. Do not climb a closed stepladder. Make sure that  both sides of the stepladder are locked into place. Ask someone to hold it for you, if possible. Clearly mark and make sure that you can see: Any grab bars or handrails. First and last steps. Where the edge of each step is. Use tools that help you move around (mobility aids) if they are needed. These include: Canes. Walkers. Scooters. Crutches. Turn on the lights when you go into a dark area. Replace any light bulbs as soon as they burn out. Set up your furniture so you have a clear path. Avoid moving your furniture around. If any of your floors are uneven, fix them. If there are any pets around you, be aware of where they are. Review your medicines with your doctor. Some medicines can make you feel dizzy. This can increase your chance of falling. Ask your doctor what other things that you can do to help prevent falls. This information is not intended to replace advice given to you by your health care provider. Make sure you discuss any questions you have with your health care provider. Document Released: 07/18/2009 Document Revised: 02/27/2016 Document Reviewed: 10/26/2014 Elsevier Interactive Patient Education  2017 [employer].

## 2021-07-04 DIAGNOSIS — I10 Essential (primary) hypertension: Secondary | ICD-10-CM

## 2021-07-05 ENCOUNTER — Other Ambulatory Visit: Payer: Self-pay | Admitting: Internal Medicine

## 2021-07-06 ENCOUNTER — Other Ambulatory Visit: Payer: Self-pay | Admitting: Internal Medicine

## 2021-07-09 NOTE — Chronic Care Management (AMB) (Signed)
Chronic Care Management   CCM RN Visit Note  06/30/2021 Name: Erica Meadows MRN: [id number] DOB: August 13, 1936  Subjective: Erica Meadows is a 85 y.o. year old female who is a primary care patient of Glendale Chard, MD. The care management team was consulted for assistance with disease management and care coordination needs.    Engaged with patient by telephone for follow up visit in response to provider referral for case management and/or care coordination services.   Consent to Services:  The patient was given information about Chronic Care Management services, agreed to services, and gave verbal consent prior to initiation of services.  Please see initial visit note for detailed documentation.   Patient agreed to services and verbal consent obtained.   Assessment: Review of patient past medical history, allergies, medications, health status, including review of consultants reports, laboratory and other test data, was performed as part of comprehensive evaluation and provision of chronic care management services.   SDOH (Social Determinants of Health) assessments and interventions performed:  Yes, no acute needs   CCM Care Plan  No Known Allergies  Outpatient Encounter Medications as of 06/30/2021  Medication Sig Note   acetaminophen (TYLENOL) 500 MG tablet Take 500 mg by mouth every 6 (six) hours as needed. prn    amLODipine (NORVASC) 10 MG tablet TAKE 1 TABLET BY MOUTH EVERY DAY    aspirin 81 MG tablet Take 81 mg by mouth every evening.     carvedilol (COREG) 12.5 MG tablet TAKE 1 TABLET BY MOUTH TWICE A DAY WITH FOOD    cholecalciferol (VITAMIN D) 1000 units tablet Take 5,000 Units by mouth daily.     furosemide (LASIX) 20 MG tablet TAKE 1 TABLET BY MOUTH EVERY DAY    losartan-hydrochlorothiazide (HYZAAR) 100-12.5 MG tablet TAKE 1 TABLET BY MOUTH EVERY DAY    rosuvastatin (CRESTOR) 10 MG tablet TAKE 1 TABLET BY MOUTH EVERY DAY    [DISCONTINUED] calcium carbonate (OSCAL) 1500  (600 Ca) MG TABS tablet Take by mouth 2 (two) times daily with a meal. 1 tablet sometimes (Patient not taking: No sig reported) 07/06/2021: causes constipation   [DISCONTINUED] KLOR-CON M20 20 MEQ tablet TAKE 1 TABLET BY MOUTH EVERY DAY    No facility-administered encounter medications on file as of 06/30/2021.    Patient Active Problem List   Diagnosis Date Noted   Other abnormal glucose 06/30/2021   Lower extremity edema 06/30/2021   Osteopenia of lumbar spine 06/30/2021   Vaginal discharge 11/01/2018   Essential hypertension 04/01/2018   Hyperlipidemia 04/01/2018   Pain and swelling of left lower extremity 04/01/2018    Conditions to be addressed/monitored: Essential hypertension, Abnormal glucose   Care Plan : Hypertension (Adult)  Updates made by Lynne Logan, RN since 06/30/2021 12:00 AM     Problem: Hypertension (Hypertension)   Priority: High     Long-Range Goal: Hypertension Monitored   Start Date: 04/14/2021  Expected End Date: 04/14/2022  Recent Progress: On track  Priority: High  Note:   Objective:  Last practice recorded BP readings:  BP Readings from Last 3 Encounters:  07/03/21 (!) 122/58  06/26/21 138/66  04/14/21 128/60   Most recent eGFR/CrCl:  Lab Results  Component Value Date   EGFR 78 06/26/2021    No components found for: CRCL Current Barriers:  Knowledge Deficits related to basic understanding of hypertension pathophysiology and self care management Knowledge Deficits related to understanding of medications prescribed for management of hypertension Case Manager Clinical Goal(s):  patient  will demonstrate improved adherence to prescribed treatment plan for hypertension as evidenced by taking all medications as prescribed, monitoring and recording blood pressure as directed, adhering to low sodium/DASH diet Interventions:  06/30/21 completed successful outbound call with patient  Collaboration with Glendale Chard, MD regarding development and  update of comprehensive plan of care as evidenced by provider attestation and co-signature Inter-disciplinary care team collaboration (see longitudinal plan of care) Provided education to patient about basic hypertension disease process Review of patient status, including review of consultant's reports, relevant laboratory and other test results, and medications completed. Reviewed medications with patient and discussed importance of medication adherence Educated patient on dietary and exercise recommendations Instructed patient on how to accurately check her BP at home; Mailed printed educational materials related to How to Accurately Monitor BP at Home Discussed plans with patient for ongoing care management follow up and provided patient with direct contact information for care management team Self-Care Activities: Self administers medications as prescribed Attends all scheduled provider appointments Calls provider office for new concerns, questions, or BP outside discussed parameters Checks BP and records as discussed Follows a low sodium diet/DASH diet Patient Goals: - check blood pressure daily - write blood pressure results in a log or diary - learn about high blood pressure  Follow Up Plan: Telephone follow up appointment with care management team member scheduled for: 10/08/21    Care Plan : Prediabetes  Updates made by Lynne Logan, RN since 06/30/2021 12:00 AM     Problem: Glycemic Management (Prediabetes)   Priority: Medium     Long-Range Goal: Glycemic Management Optimized   Start Date: 04/14/2021  Expected End Date: 04/14/2022  Recent Progress: On track  Priority: Medium  Note:   Objective:  Lab Results  Component Value Date   HGBA1C 6.1 (H) 06/26/2021   Lab Results  Component Value Date   CREATININE 0.76 06/26/2021   CREATININE 0.65 12/18/2020   CREATININE 0.77 06/19/2020   Lab Results  Component Value Date   EGFR 78 06/26/2021    Current Barriers:   Knowledge Deficits related to basic Diabetes pathophysiology and self care/management Knowledge Deficits related to medications used for management of diabetes Case Manager Clinical Goal(s):  patient will demonstrate improved adherence to prescribed treatment plan for diabetes self care/management as evidenced by: adherence to ADA/ carb modified diet exercise 5 days/week contacting provider for new or worsened symptoms or questions Interventions:  06/30/21 completed successful outbound call with patient  Collaboration with Glendale Chard, MD regarding development and update of comprehensive plan of care as evidenced by provider attestation and co-signature Inter-disciplinary care team collaboration (see longitudinal plan of care) Provided education to patient about basic DM disease process Review of patient status, including review of consultant's reports, relevant laboratory and other test results, and medications completed. Reviewed medications with patient and discussed importance of medication adherence Educated patient on dietary and exercise recommendations; daily glycemic control FBS 80-130, <180 after meals;15'15' rule  Mailed printed educational materials related to Prediabetes management  Discussed plans with patient for ongoing care management follow up and provided patient with direct contact information for care management team Self-Care Activities - adhere to ADA/carb modified diet - exercise 5 days/week  - contact provider for new or worsened symptoms or questions  Patient Goals: - drink 6 to 8 glasses of water each day - manage portion size  Follow Up Plan: Telephone follow up appointment with care management team member scheduled for:  10/08/21     Plan:Telephone follow  up appointment with care management team member scheduled for:  10/08/20  Barb Merino, RN, BSN, CCM Care Management Coordinator [place] [facility]  Direct Phone:  [phone]

## 2021-07-09 NOTE — Patient Instructions (Signed)
Visit Information  PATIENT GOALS:  Goals Addressed      Monitor and Manage My Blood Sugar-Diabetes Type 2   On track    Timeframe:  Long-Range Goal Priority:  Medium Start Date:  04/14/21                           Expected End Date:  04/14/22                     Follow Up Date: 10/08/21    Patient Goals: - drink 6 to 8 glasses of water each day - manage portion size   Why is this important?   Checking your blood sugar at home helps to keep it from getting very high or very low.  Writing the results in a diary or log helps the doctor know how to care for you.  Your blood sugar log should have the time, date and the results.  Also, write down the amount of insulin or other medicine that you take.  Other information, like what you ate, exercise done and how you were feeling, will also be helpful.     Notes:      Track and Manage My Blood Pressure-Hypertension   On track    Timeframe:  Long-Range Goal Priority:  High Start Date:  04/14/21                           Expected End Date:  04/14/22                     Follow Up Date: 10/08/21    Patient Goals: - check blood pressure daily - write blood pressure results in a log or diary - learn about high blood pressure - adhere to dietary and exercise recommendations    Why is this important?   You won't feel high blood pressure, but it can still hurt your blood vessels.  High blood pressure can cause heart or kidney problems. It can also cause a stroke.  Making lifestyle changes like losing a Margery Szostak weight or eating less salt will help.  Checking your blood pressure at home and at different times of the day can help to control blood pressure.  If the doctor prescribes medicine remember to take it the way the doctor ordered.  Call the office if you cannot afford the medicine or if there are questions about it.     Notes:      The patient verbalized understanding of instructions, educational materials, and care plan provided today  and declined offer to receive copy of patient instructions, educational materials, and care plan.   Telephone follow up appointment with care management team member scheduled for: 10/08/21  Barb Merino, RN, BSN, CCM Care Management Coordinator [place] [facility]  Direct Phone: [phone]

## 2021-07-29 DIAGNOSIS — R011 Cardiac murmur, unspecified: Secondary | ICD-10-CM | POA: Diagnosis not present

## 2021-07-29 DIAGNOSIS — I872 Venous insufficiency (chronic) (peripheral): Secondary | ICD-10-CM | POA: Diagnosis not present

## 2021-07-29 DIAGNOSIS — I119 Hypertensive heart disease without heart failure: Secondary | ICD-10-CM | POA: Diagnosis not present

## 2021-07-29 DIAGNOSIS — Z008 Encounter for other general examination: Secondary | ICD-10-CM | POA: Diagnosis not present

## 2021-07-29 DIAGNOSIS — E785 Hyperlipidemia, unspecified: Secondary | ICD-10-CM | POA: Diagnosis not present

## 2021-07-29 DIAGNOSIS — E213 Hyperparathyroidism, unspecified: Secondary | ICD-10-CM | POA: Diagnosis not present

## 2021-07-29 DIAGNOSIS — Z0000 Encounter for general adult medical examination without abnormal findings: Secondary | ICD-10-CM | POA: Diagnosis not present

## 2021-08-06 ENCOUNTER — Other Ambulatory Visit: Payer: Self-pay | Admitting: Internal Medicine

## 2021-09-02 DIAGNOSIS — M8588 Other specified disorders of bone density and structure, other site: Secondary | ICD-10-CM | POA: Diagnosis not present

## 2021-09-02 DIAGNOSIS — Z1231 Encounter for screening mammogram for malignant neoplasm of breast: Secondary | ICD-10-CM | POA: Diagnosis not present

## 2021-09-02 LAB — HM DEXA SCAN: HM Dexa Scan: NORMAL

## 2021-09-02 LAB — HM MAMMOGRAPHY: HM Mammogram: NORMAL (ref 0–4)

## 2021-09-05 ENCOUNTER — Encounter: Payer: Self-pay | Admitting: Internal Medicine

## 2021-09-10 ENCOUNTER — Telehealth: Payer: Self-pay

## 2021-09-10 ENCOUNTER — Encounter: Payer: Self-pay | Admitting: Internal Medicine

## 2021-09-10 NOTE — Telephone Encounter (Signed)
I left a detailed message that the pt's bone density results are normal.

## 2021-09-17 ENCOUNTER — Other Ambulatory Visit: Payer: Self-pay | Admitting: Internal Medicine

## 2021-09-18 ENCOUNTER — Other Ambulatory Visit: Payer: Self-pay | Admitting: Internal Medicine

## 2021-10-08 ENCOUNTER — Telehealth: Payer: Self-pay

## 2021-10-08 ENCOUNTER — Telehealth: Payer: Medicare Other

## 2021-10-08 NOTE — Telephone Encounter (Signed)
°  Care Management   Follow Up Note   10/08/2021 Name: Erica Meadows MRN: [id number] DOB: April 02, 1936   Referred by: Glendale Chard, MD Reason for referral : Chronic Care Management (RN CM Follow up call )   An unsuccessful telephone outreach was attempted today. The patient was referred to the case management team for assistance with care management and care coordination.   Follow Up Plan: A HIPPA compliant phone message was left for the patient providing contact information and requesting a return call.   Barb Merino, RN, BSN, CCM Care Management Coordinator [place] [facility]  Direct Phone: [phone]

## 2021-10-29 ENCOUNTER — Telehealth: Payer: Self-pay

## 2021-10-29 ENCOUNTER — Telehealth: Payer: Medicare Other

## 2021-10-29 NOTE — Telephone Encounter (Signed)
°  Care Management   Follow Up Note   10/29/2021 Name: Erica Meadows MRN: [id number] DOB: 1936-06-26   Referred by: Glendale Chard, MD Reason for referral : Chronic Care Management (RN CM Follow up call )   A second unsuccessful telephone outreach was attempted today. The patient was referred to the case management team for assistance with care management and care coordination.   Follow Up Plan: A HIPPA compliant phone message was left for the patient providing contact information and requesting a return call.   Barb Merino, RN, BSN, CCM Care Management Coordinator [place] [facility]  Direct Phone: [phone]

## 2021-11-17 ENCOUNTER — Telehealth: Payer: Medicare Other

## 2021-11-17 ENCOUNTER — Ambulatory Visit (INDEPENDENT_AMBULATORY_CARE_PROVIDER_SITE_OTHER): Payer: Medicare Other

## 2021-11-17 DIAGNOSIS — R7309 Other abnormal glucose: Secondary | ICD-10-CM

## 2021-11-17 DIAGNOSIS — I10 Essential (primary) hypertension: Secondary | ICD-10-CM

## 2021-11-17 NOTE — Patient Instructions (Signed)
Visit Information  Thank you for taking time to visit with me today. Please don't hesitate to contact me if I can be of assistance to you before our next scheduled telephone appointment.  Following are the goals we discussed today:  (Copy and paste patient goals from clinical care plan here)  Our next appointment is by telephone on 12/29/21 at 12 noon  Please call the care guide team at [phone] if you need to cancel or reschedule your appointment.   If you are experiencing a Mental Health or [place] or need someone to talk to, please call 1-800-273-TALK (toll free, 24 hour hotline)   The patient verbalized understanding of instructions, educational materials, and care plan provided today and agreed to receive a mailed copy of patient instructions, educational materials, and care plan.   Barb Merino, RN, BSN, CCM Care Management Coordinator [place] [facility]  Direct Phone: [phone]

## 2021-11-17 NOTE — Chronic Care Management (AMB) (Signed)
Chronic Care Management   CCM RN Visit Note  11/17/2021 Name: Erica Meadows MRN: [id number] DOB: 12/11/1935  Subjective: Erica Meadows is a 86 y.o. year old female who is a primary care patient of Glendale Chard, MD. The care management team was consulted for assistance with disease management and care coordination needs.    Engaged with patient by telephone for follow up visit in response to provider referral for case management and/or care coordination services.   Consent to Services:  The patient was given information about Chronic Care Management services, agreed to services, and gave verbal consent prior to initiation of services.  Please see initial visit note for detailed documentation.   Patient agreed to services and verbal consent obtained.   Assessment: Review of patient past medical history, allergies, medications, health status, including review of consultants reports, laboratory and other test data, was performed as part of comprehensive evaluation and provision of chronic care management services.   SDOH (Social Determinants of Health) assessments and interventions performed:  Yes, no acute changes   CCM Care Plan  No Known Allergies  Outpatient Encounter Medications as of 11/17/2021  Medication Sig   acetaminophen (TYLENOL) 500 MG tablet Take 500 mg by mouth every 6 (six) hours as needed. prn   amLODipine (NORVASC) 10 MG tablet TAKE 1 TABLET BY MOUTH EVERY DAY   aspirin 81 MG tablet Take 81 mg by mouth every evening.    carvedilol (COREG) 12.5 MG tablet TAKE 1 TABLET BY MOUTH TWICE A DAY WITH FOOD   cholecalciferol (VITAMIN D) 1000 units tablet Take 5,000 Units by mouth 2 (two) times a week.   furosemide (LASIX) 20 MG tablet TAKE 1 TABLET BY MOUTH EVERY DAY   KLOR-CON M20 20 MEQ tablet TAKE 1 TABLET BY MOUTH EVERY DAY   losartan-hydrochlorothiazide (HYZAAR) 100-12.5 MG tablet TAKE 1 TABLET BY MOUTH EVERY DAY   rosuvastatin (CRESTOR) 10 MG tablet TAKE 1 TABLET  BY MOUTH EVERY DAY   No facility-administered encounter medications on file as of 11/17/2021.    Patient Active Problem List   Diagnosis Date Noted   Other abnormal glucose 06/30/2021   Lower extremity edema 06/30/2021   Osteopenia of lumbar spine 06/30/2021   Vaginal discharge 11/01/2018   Essential hypertension 04/01/2018   Hyperlipidemia 04/01/2018   Pain and swelling of left lower extremity 04/01/2018    Conditions to be addressed/monitored: Essential hypertension, Abnormal glucose   Care Plan : [place] of Care  Updates made by Lynne Logan, RN since 11/17/2021 12:00 AM     Problem: No plan of care established for management of chronic disease states (Essential hypertension, Abnormal glucose)   Priority: High     Long-Range Goal: Establishment of plan of care for management of chronic disease states (Essential hypertension, Abnormal glucose)   Note:   Current Barriers:  Knowledge Deficits related to plan of care for management of Essential hypertension, Abnormal glucose   Chronic Disease Management support and education needs related to Essential hypertension, Abnormal glucose    RNCM Clinical Goal(s):  Patient will verbalize basic understanding of  Essential hypertension, Abnormal glucose disease process and self health management plan as evidenced by patient will report having no disease exacerbations related to her chronic disease states  take all medications exactly as prescribed and will call provider for medication related questions as evidenced by demonstrate improved understanding of prescribed medications and rationale for usage as evidenced by patient teach back demonstrate Ongoing health management  independence as evidenced by patient will report 100% adherence to her prescribed treatment plan  continue to work with RN Care Manager to address care management and care coordination needs related to  Essential hypertension, Abnormal glucose  as evidenced  by adherence to CM Team Scheduled appointments demonstrate ongoing self health care management ability   as evidenced by    through collaboration with RN Care manager, provider, and care team.   Interventions: 1:1 collaboration with primary care provider regarding development and update of comprehensive plan of care as evidenced by provider attestation and co-signature Inter-disciplinary care team collaboration (see longitudinal plan of care) Evaluation of current treatment plan related to  self management and patient's adherence to plan as established by provider   Diabetes Interventions:  (Status:  Goal on track:  Yes.) Long Term Goal Assessed patient's understanding of A1c goal: <6.5% Provided education to patient about basic DM disease process Reviewed medications with patient and discussed importance of medication adherence Counseled on importance of regular laboratory monitoring as prescribed Review of patient status, including review of consultants reports, relevant laboratory and other test results, and medications completed Educated patient on dietary and exercise recommendations Mailed printed educational materials related to The Dangers in Skipping Meals  Discussed plans with patient for ongoing care management follow up and provided patient with direct contact information for care management team Lab Results  Component Value Date   HGBA1C 6.1 (H) 06/26/2021   Hypertension Interventions:  (Status:  Goal on track:  Yes.) Long Term Goal Last practice recorded BP readings:  BP Readings from Last 3 Encounters:  07/03/21 (!) 122/58  06/26/21 138/66  04/14/21 128/60  Most recent eGFR/CrCl:  Lab Results  Component Value Date   EGFR 78 06/26/2021    No components found for: CRCL Evaluation of current treatment plan related to hypertension self management and patient's adherence to plan as established by provider Counseled on the importance of exercise goals with target of 150  minutes per week Advised patient, providing education and rationale, to monitor blood pressure daily and record, calling PCP for findings outside established parameters Provided education on prescribed diet low Sodium  Provided verbal and written instructions related to How to Accurately Monitor BP at [employer] printed educational materials related to How to Perform Chair Exercises; Why Should I Lower Sodium Encouraged patient to bring her BP cuff into the office during her next scheduled visit to have checked for accuracy Reviewed scheduled/upcoming provider appointments including: next PCP follow up appointment scheduled for 12/29/21 _0 :00 PM  Discussed plans with patient for ongoing care management follow up and provided patient with direct contact information for care management team  Patient Goals/Self-Care Activities: Take all medications as prescribed Attend all scheduled provider appointments Call pharmacy for medication refills 3-7 days in advance of running out of medications Perform all self care activities independently  Perform IADL's (shopping, preparing meals, housekeeping, managing finances) independently Call provider office for new concerns or questions  drink 6 to 8 glasses of water each day manage portion size check blood pressure 3 times per week write blood pressure results in a log or diary keep a blood pressure log take blood pressure log to all doctor appointments call doctor for signs and symptoms of high blood pressure take medications for blood pressure exactly as prescribed begin an exercise program report new symptoms to your doctor  Follow Up Plan:  Telephone follow up appointment with care management team member scheduled for:  12/31/21  Plan:Telephone follow up appointment with care management team member scheduled for:  12/31/21  Barb Merino, RN, BSN, CCM Care Management Coordinator [place] [facility]   Direct Phone: [phone]

## 2021-12-02 DIAGNOSIS — R7309 Other abnormal glucose: Secondary | ICD-10-CM

## 2021-12-02 DIAGNOSIS — I10 Essential (primary) hypertension: Secondary | ICD-10-CM

## 2021-12-26 ENCOUNTER — Other Ambulatory Visit: Payer: Self-pay | Admitting: Internal Medicine

## 2021-12-29 ENCOUNTER — Ambulatory Visit (INDEPENDENT_AMBULATORY_CARE_PROVIDER_SITE_OTHER): Payer: Medicare Other | Admitting: Internal Medicine

## 2021-12-29 ENCOUNTER — Other Ambulatory Visit: Payer: Self-pay

## 2021-12-29 ENCOUNTER — Encounter: Payer: Self-pay | Admitting: Internal Medicine

## 2021-12-29 ENCOUNTER — Telehealth (INDEPENDENT_AMBULATORY_CARE_PROVIDER_SITE_OTHER): Payer: Self-pay

## 2021-12-29 VITALS — BP 128/66 | HR 61 | Temp 97.7°F | Ht 59.0 in | Wt 138.2 lb

## 2021-12-29 DIAGNOSIS — R600 Localized edema: Secondary | ICD-10-CM

## 2021-12-29 DIAGNOSIS — I10 Essential (primary) hypertension: Secondary | ICD-10-CM

## 2021-12-29 DIAGNOSIS — G8929 Other chronic pain: Secondary | ICD-10-CM | POA: Diagnosis not present

## 2021-12-29 DIAGNOSIS — M25512 Pain in left shoulder: Secondary | ICD-10-CM

## 2021-12-29 DIAGNOSIS — E663 Overweight: Secondary | ICD-10-CM

## 2021-12-29 DIAGNOSIS — R7309 Other abnormal glucose: Secondary | ICD-10-CM | POA: Diagnosis not present

## 2021-12-29 DIAGNOSIS — M25511 Pain in right shoulder: Secondary | ICD-10-CM | POA: Diagnosis not present

## 2021-12-29 DIAGNOSIS — Z6827 Body mass index (BMI) 27.0-27.9, adult: Secondary | ICD-10-CM

## 2021-12-29 NOTE — Telephone Encounter (Signed)
The pt was notified that she left without getting her prevnar 20.  The pt said she will return tomorrow for her injection. ?

## 2021-12-29 NOTE — Progress Notes (Signed)
?[employer] as a Education administrator for [name], MD.,have documented all relevant documentation on the behalf of [name], MD,as directed by  [name], MD while in the presence of [name], MD.  ?This visit occurred during the SARS-CoV-2 public health emergency.  Safety protocols were in place, including screening questions prior to the visit, additional usage of staff PPE, and extensive cleaning of exam room while observing appropriate contact time as indicated for disinfecting solutions. ? ?Subjective:  ?  ? Patient ID: Erica Meadows , female    DOB: 07/17/1936 , 86 y.o.   MRN: [id number] ? ? ?Chief Complaint  ?Patient presents with  ? Hypertension  ? vitamin d f/u  ? ? ?HPI ? ?The patient is here today for a blood pressure follow-up. She reports compliance with meds. She denies headaches, chest pain and shortness of breath.  ? ?Hypertension ?This is a chronic problem. The current episode started more than 1 year ago. The problem has been gradually improving since onset. The problem is controlled. Pertinent negatives include no blurred vision, chest pain, palpitations or shortness of breath. Risk factors for coronary artery disease include post-menopausal state and sedentary lifestyle. Past treatments include calcium channel blockers, angiotensin blockers and diuretics. The current treatment provides moderate improvement.   ? ?Past Medical History:  ?Diagnosis Date  ? High cholesterol   ? Hyperparathyroidism ([place])   ? Hypertension   ? under control; has been on med. x 15-20 yr.  ? Seasonal allergies   ? Trigger thumb of left hand 12/2012  ?  ? ?Family History  ?Problem Relation Age of Onset  ? Hypertension Mother   ? Stroke Mother   ? Stroke Father   ? Hypertension Father   ? ? ? ?Current Outpatient Medications:  ?  acetaminophen (TYLENOL) 500 MG tablet, Take 500 mg by mouth every 6 (six) hours as needed. prn, Disp: , Rfl:  ?  amLODipine (NORVASC) 10 MG tablet, TAKE 1 TABLET BY  MOUTH EVERY DAY, Disp: 90 tablet, Rfl: 1 ?  aspirin 81 MG tablet, Take 81 mg by mouth every evening. , Disp: , Rfl:  ?  carvedilol (COREG) 12.5 MG tablet, TAKE 1 TABLET BY MOUTH TWICE A DAY WITH FOOD, Disp: 180 tablet, Rfl: 1 ?  cholecalciferol (VITAMIN D) 1000 units tablet, Take 5,000 Units by mouth 2 (two) times a week., Disp: , Rfl:  ?  furosemide (LASIX) 20 MG tablet, TAKE 1 TABLET BY MOUTH EVERY DAY, Disp: 90 tablet, Rfl: 1 ?  KLOR-CON M20 20 MEQ tablet, TAKE 1 TABLET BY MOUTH EVERY DAY, Disp: 90 tablet, Rfl: 1 ?  losartan-hydrochlorothiazide (HYZAAR) 100-12.5 MG tablet, TAKE 1 TABLET BY MOUTH EVERY DAY, Disp: 90 tablet, Rfl: 1 ?  rosuvastatin (CRESTOR) 10 MG tablet, TAKE 1 TABLET BY MOUTH EVERY DAY, Disp: 90 tablet, Rfl: 1  ? ?No Known Allergies  ? ?Review of Systems  ?Constitutional: Negative.   ?Eyes:  Negative for blurred vision.  ?Respiratory: Negative.  Negative for shortness of breath.   ?Cardiovascular:  Positive for leg swelling. Negative for chest pain and palpitations.  ?     She reports having some ankle swelling, nothing new. States her sx improve after lying in bed overnight.   ?Gastrointestinal: Negative.   ?Musculoskeletal:  Positive for arthralgias.  ?     She c/o b/l shoulder pain. Denies fall/trauma. There is pain with movement. States she often has pain with reaching above her head.   ?Psychiatric/Behavioral: Negative.    ?  All other systems reviewed and are negative.  ? ?Today's Vitals  ? 12/29/21 1519  ?BP: 128/66  ?Pulse: 61  ?Temp: 97.7 ?F (36.5 ?C)  ?Weight: 138 lb 3.2 oz (62.7 kg)  ?Height: 4' 11"  (1.499 m)  ? ?Body mass index is 27.91 kg/m?.  ?Wt Readings from Last 3 Encounters:  ?12/29/21 138 lb 3.2 oz (62.7 kg)  ?07/03/21 139 lb 3.2 oz (63.1 kg)  ?06/26/21 137 lb 3.2 oz (62.2 kg)  ?  ?BP Readings from Last 3 Encounters:  ?12/29/21 128/66  ?07/03/21 (!) 122/58  ?06/26/21 138/66  ?  ?Objective:  ?Physical Exam ?Vitals and nursing note reviewed.  ?Constitutional:   ?   Appearance:  Normal appearance.  ?HENT:  ?   Head: Normocephalic and atraumatic.  ?   Nose:  ?   Comments: Masked  ?   Mouth/Throat:  ?   Comments: Masked  ?Eyes:  ?   Extraocular Movements: Extraocular movements intact.  ?Cardiovascular:  ?   Rate and Rhythm: Normal rate and regular rhythm.  ?   Heart sounds: Normal heart sounds.  ?Pulmonary:  ?   Effort: Pulmonary effort is normal.  ?   Breath sounds: Normal breath sounds.  ?Musculoskeletal:     ?   General: Tenderness present.  ?   Right lower leg: Edema present.  ?   Left lower leg: Edema present.  ?   Comments: She has ankle edema b/l. Anterior R shoulder tenderness to palpation ?There is decreased ROM of R shoulder  ?Skin: ?   General: Skin is warm.  ?Neurological:  ?   General: No focal deficit present.  ?   Mental Status: She is alert.  ?Psychiatric:     ?   Mood and Affect: Mood normal.     ?   Behavior: Behavior normal.  ?   ?Assessment And Plan:  ?   ?1. Essential hypertension ?Comments: Chronic, well controlled.  I will check renal function. She will rto in six months for re-evaluation.  ?- CMP14+EGFR ? ?2. Lower extremity edema ?Comments: Pt advised her sx are likely related to amlodipine. She is advised to consider wearing compression hose when she will be on her feet for long period of time.  ? ?3. Chronic pain of both shoulders ?Comments: She does not wish to have Ortho referral and x-rays at this time. Pt agrees to notify me if her sx persist. ? ?4. Other abnormal glucose ?Comments: Her a1c has been elevated in the past. I will recheck this today. She is encouraged to limit her intake of sweetened beverages.  ?- Hemoglobin A1c ? ?5. Hypercalcemia ?Comments: She is on both hctz and lasix. I will recheck levels today. Pt is willing to d/c losartan/hct and switch to monotherapy losartan. Prior PTH is negative.  ? ?6. Overweight with body mass index (BMI) of 27 to 27.9 in adult ?Comments: She is encouraged to aim for at least 150 minutes of exercise per week.   ? ?Patient was given opportunity to ask questions. Patient verbalized understanding of the plan and was able to repeat key elements of the plan. All questions were answered to their satisfaction.  ? ?I, [name], MD, have reviewed all documentation for this visit. The documentation on 12/29/21 for the exam, diagnosis, procedures, and orders are all accurate and complete.  ? ?IF YOU HAVE BEEN REFERRED TO A SPECIALIST, IT MAY TAKE 1-2 WEEKS TO SCHEDULE/PROCESS THE REFERRAL. IF YOU HAVE NOT HEARD FROM US/SPECIALIST IN TWO  WEEKS, PLEASE GIVE [place] A CALL AT [phone].  ? ?THE PATIENT IS ENCOURAGED TO PRACTICE SOCIAL DISTANCING DUE TO THE COVID-19 PANDEMIC.   ?

## 2021-12-29 NOTE — Patient Instructions (Signed)
Cooking With Less Salt Cooking with less salt is one way to reduce the amount of sodium you get from food. Sodium is one of the elements that make up salt. It is found naturally in foods and is also added to certain foods. Depending on your condition and overall health, your health care provider or dietitian may recommend that you reduce your sodium intake. Most people should have less than 2,300 milligrams (mg) of sodium each day. If you have high blood pressure (hypertension), you may need to limit your sodium to 1,500 mg each day. Follow the tipsbelow to help reduce your sodium intake. What are tips for eating less sodium? Reading food labels  Check the food label before buying or using packaged ingredients. Always check the label for the serving size and sodium content. Look for products with no more than 140 mg of sodium in one serving. Check the % Daily Value column to see what percent of the daily recommended amount of sodium is provided in one serving of the product. Foods with 5% or less in this column are considered low in sodium. Foods with 20% or higher are considered high in sodium. Do not choose foods with salt as one of the first three ingredients on the ingredients list. If salt is one of the first three ingredients, it usually means the item is high in sodium.  Shopping Buy sodium-free or low-sodium products. Look for the following words on food labels: Low-sodium. Sodium-free. Reduced-sodium. No salt added. Unsalted. Always check the sodium content even if foods are labeled as low-sodium or no salt added. Buy fresh foods. Cooking Use herbs, seasonings without salt, and spices as substitutes for salt. Use sodium-free baking soda when baking. Grill, braise, or roast foods to add flavor with less salt. Avoid adding salt to pasta, rice, or hot cereals. Drain and rinse canned vegetables, beans, and meat before use. Avoid adding salt when cooking sweets and desserts. Cook with  low-sodium ingredients. What foods are high in sodium? Vegetables Regular canned vegetables (not low-sodium or reduced-sodium). Sauerkraut, pickled vegetables, and relishes. Olives. French fries. Onion rings. Regular canned tomato sauce and paste. Regular tomato and vegetable juice. Frozenvegetables in sauces. Grains Instant hot cereals. Bread stuffing, pancake, and biscuit mixes. Croutons. Seasoned rice or pasta mixes. Noodle soup cups. Boxed or frozen macaroni and cheese. Regular salted crackers. Self-rising flour. Rolls. Bagels. Flourtortillas and wraps. Meats and other proteins Meat or fish that is salted, canned, smoked, cured, spiced, or pickled. This includes bacon, ham, sausages, hot dogs, corned beef, chipped beef, meat loaves, salt pork, jerky, pickled herring, anchovies, regular canned tuna, andsardines. Salted nuts. Dairy Processed cheese and cheese spreads. Cheese curds. Blue cheese. Feta cheese.String cheese. Regular cottage cheese. Buttermilk. Canned milk. The items listed above may not be a complete list of foods high in sodium. Actual amounts of sodium may be different depending on processing. Contact a dietitian for more information. What foods are low in sodium? Fruits Fresh, frozen, or canned fruit with no sauce added. Fruit juice. Vegetables Fresh or frozen vegetables with no sauce added. "No salt added" canned vegetables. "No salt added" tomato sauce and paste. Low-sodium orreduced-sodium tomato and vegetable juice. Grains Noodles, pasta, quinoa, rice. Shredded or puffed wheat or puffed rice. Regular or quick oats (not instant). Low-sodium crackers. Low-sodium bread. Whole-grainbread and whole-grain pasta. Unsalted popcorn. Meats and other proteins Fresh or frozen whole meats, poultry (not injected with sodium), and fish with no sauce added. Unsalted nuts. Dried peas, beans, and   lentils without added salt. Unsalted canned beans. Eggs. Unsalted nut butters. Low-sodium canned  tunaor chicken. Dairy Milk. Soy milk. Yogurt. Low-sodium cheeses, such as Swiss, Monterey Jack, mozzarella, and ricotta. Sherbet or ice cream (keep to  cup per serving).Cream cheese. Fats and oils Unsalted butter or margarine. Other foods Homemade pudding. Sodium-free baking soda and baking powder. Herbs and spices.Low-sodium seasoning mixes. Beverages Coffee and tea. Carbonated beverages. The items listed above may not be a complete list of foods low in sodium. Actual amounts of sodium may be different depending on processing. Contact a dietitian for more information. What are some salt alternatives when cooking? The following are herbs, seasonings, and spices that can be used instead of salt to flavor your food. Herbs should be fresh or dried. Do not choose packaged mixes. Next to the name of the herb, spice, or seasoning aresome examples of foods you can pair it with. Herbs [place] leaves - Soups, meat and vegetable dishes, and spaghetti sauce. Basil - Italian dishes, soups, pasta, and fish dishes. Cilantro - Meat, poultry, and vegetable dishes. Chili powder - Marinades and Mexican dishes. Chives - Salad dressings and potato dishes. Cumin - Mexican dishes, couscous, and meat dishes. Dill - Fish dishes, sauces, and salads. Fennel - Meat and vegetable dishes, breads, and cookies. Garlic (do not use garlic salt) - Italian dishes, meat dishes, salad dressings, and sauces. Marjoram - Soups, potato dishes, and meat dishes. Oregano - Pizza and spaghetti sauce. Parsley - Salads, soups, pasta, and meat dishes. Rosemary - Italian dishes, salad dressings, soups, and red meats. Saffron - Fish dishes, pasta, and some poultry dishes. Sage - Stuffings and sauces. Tarragon - Fish and poultry dishes. Thyme - Stuffing, meat, and fish dishes. Seasonings Lemon juice - Fish dishes, poultry dishes, vegetables, and salads. Vinegar - Salad dressings, vegetables, and fish dishes. Spices Cinnamon - Sweet  dishes, such as cakes, cookies, and puddings. Cloves - Gingerbread, puddings, and marinades for meats. Curry - Vegetable dishes, fish and poultry dishes, and stir-fry dishes. Ginger - Vegetable dishes, fish dishes, and stir-fry dishes. Nutmeg - Pasta, vegetables, poultry, fish dishes, and custard. Summary Cooking with less salt is one way to reduce the amount of sodium that you get from food. Buy sodium-free or low-sodium products. Check the food label before using or buying packaged ingredients. Use herbs, seasonings without salt, and spices as substitutes for salt in foods. This information is not intended to replace advice given to you by your health care provider. Make sure you discuss any questions you have with your healthcare provider. Document Revised: 09/13/2019 Document Reviewed: 09/13/2019 Elsevier Patient Education  2022 Elsevier Inc.  

## 2021-12-30 ENCOUNTER — Ambulatory Visit (INDEPENDENT_AMBULATORY_CARE_PROVIDER_SITE_OTHER): Payer: Medicare Other

## 2021-12-30 VITALS — BP 126/78 | HR 58 | Temp 98.1°F | Ht 59.0 in | Wt 138.0 lb

## 2021-12-30 DIAGNOSIS — Z23 Encounter for immunization: Secondary | ICD-10-CM | POA: Diagnosis not present

## 2021-12-30 LAB — CMP14+EGFR
ALT: 10 IU/L (ref 0–32)
AST: 16 IU/L (ref 0–40)
Albumin/Globulin Ratio: 2 (ref 1.2–2.2)
Albumin: 4.6 g/dL (ref 3.6–4.6)
Alkaline Phosphatase: 74 IU/L (ref 44–121)
BUN/Creatinine Ratio: 18 (ref 12–28)
BUN: 13 mg/dL (ref 8–27)
Bilirubin Total: 0.5 mg/dL (ref 0.0–1.2)
CO2: 26 mmol/L (ref 20–29)
Calcium: 10.4 mg/dL — ABNORMAL HIGH (ref 8.7–10.3)
Chloride: 102 mmol/L (ref 96–106)
Creatinine, Ser: 0.73 mg/dL (ref 0.57–1.00)
Globulin, Total: 2.3 g/dL (ref 1.5–4.5)
Glucose: 94 mg/dL (ref 70–99)
Potassium: 4 mmol/L (ref 3.5–5.2)
Sodium: 142 mmol/L (ref 134–144)
Total Protein: 6.9 g/dL (ref 6.0–8.5)
eGFR: 82 mL/min/{1.73_m2} (ref >59)

## 2021-12-30 LAB — HEMOGLOBIN A1C
Est. average glucose Bld gHb Est-mCnc: 128 mg/dL
Hgb A1c MFr Bld: 6.1 % — ABNORMAL HIGH (ref 4.8–5.6)

## 2021-12-30 NOTE — Progress Notes (Signed)
Patient presents today for a pneumonia 20 vaccine.  

## 2021-12-31 ENCOUNTER — Telehealth: Payer: Medicare Other

## 2021-12-31 ENCOUNTER — Other Ambulatory Visit: Payer: Self-pay | Admitting: Internal Medicine

## 2021-12-31 ENCOUNTER — Ambulatory Visit (INDEPENDENT_AMBULATORY_CARE_PROVIDER_SITE_OTHER): Payer: Medicare Other

## 2021-12-31 DIAGNOSIS — I10 Essential (primary) hypertension: Secondary | ICD-10-CM

## 2021-12-31 DIAGNOSIS — R7309 Other abnormal glucose: Secondary | ICD-10-CM

## 2021-12-31 MED ORDER — LOSARTAN POTASSIUM 100 MG PO TABS: 100.0000 mg | ORAL_TABLET | Freq: Every day | ORAL | 0 refills | Status: DC

## 2021-12-31 NOTE — Chronic Care Management (AMB) (Signed)
?Chronic Care Management  ? ?CCM RN Visit Note ? ?12/31/2021 ?Name: Erica Meadows MRN: [id number] DOB: 1936/04/19 ? ?Subjective: ?Erica Meadows is a 86 y.o. year old female who is a primary care patient of Glendale Chard, MD. The care management team was consulted for assistance with disease management and care coordination needs.   ? ?Engaged with patient by telephone for follow up visit in response to provider referral for case management and/or care coordination services.  ? ?Consent to Services:  ?The patient was given information about Chronic Care Management services, agreed to services, and gave verbal consent prior to initiation of services.  Please see initial visit note for detailed documentation.  ? ?Patient agreed to services and verbal consent obtained.  ? ?Assessment: Review of patient past medical history, allergies, medications, health status, including review of consultants reports, laboratory and other test data, was performed as part of comprehensive evaluation and provision of chronic care management services.  ? ?SDOH (Social Determinants of Health) assessments and interventions performed:  Yes, no acute challenges  ? ?CCM Care Plan ? ?No Known Allergies ? ?Outpatient Encounter Medications as of 12/31/2021  ?Medication Sig  ? acetaminophen (TYLENOL) 500 MG tablet Take 500 mg by mouth every 6 (six) hours as needed. prn  ? amLODipine (NORVASC) 10 MG tablet TAKE 1 TABLET BY MOUTH EVERY DAY  ? aspirin 81 MG tablet Take 81 mg by mouth every evening.   ? carvedilol (COREG) 12.5 MG tablet TAKE 1 TABLET BY MOUTH TWICE A DAY WITH FOOD  ? cholecalciferol (VITAMIN D) 1000 units tablet Take 5,000 Units by mouth 2 (two) times a week.  ? furosemide (LASIX) 20 MG tablet TAKE 1 TABLET BY MOUTH EVERY DAY  ? KLOR-CON M20 20 MEQ tablet TAKE 1 TABLET BY MOUTH EVERY DAY  ? losartan-hydrochlorothiazide (HYZAAR) 100-12.5 MG tablet TAKE 1 TABLET BY MOUTH EVERY DAY  ? rosuvastatin (CRESTOR) 10 MG tablet TAKE 1  TABLET BY MOUTH EVERY DAY  ? ?No facility-administered encounter medications on file as of 12/31/2021.  ? ? ?Patient Active Problem List  ? Diagnosis Date Noted  ? Chronic pain of both shoulders 12/29/2021  ? Hypercalcemia 12/29/2021  ? Other abnormal glucose 06/30/2021  ? Lower extremity edema 06/30/2021  ? Osteopenia of lumbar spine 06/30/2021  ? Vaginal discharge 11/01/2018  ? Essential hypertension 04/01/2018  ? Hyperlipidemia 04/01/2018  ? Pain and swelling of left lower extremity 04/01/2018  ? ? ?Conditions to be addressed/monitored: Essential hypertension, Abnormal glucose  ? ?Care Plan : RN Care Manager Plan of Care  ?Updates made by Lynne Logan, RN since 12/31/2021 12:00 AM  ?  ? ?Problem: No plan of care established for management of chronic disease states (Essential hypertension, Abnormal glucose)   ?Priority: High  ?  ? ?Long-Range Goal: Establishment of plan of care for management of chronic disease states (Essential hypertension, Abnormal glucose)   ?Note:   ?Current Barriers:  ?Knowledge Deficits related to plan of care for management of Essential hypertension, Abnormal glucose   ?Chronic Disease Management support and education needs related to Essential hypertension, Abnormal glucose   ? ?RNCM Clinical Goal(s):  ?Patient will verbalize basic understanding of  Essential hypertension, Abnormal glucose disease process and self health management plan as evidenced by patient will experience no disease exacerbations related to her chronic disease states as listed above  ?take all medications exactly as prescribed and will call provider for medication related questions as evidenced by demonstrate improved understanding of prescribed medications and  rationale for usage as evidenced by patient teach back ?demonstrate Ongoing health management independence as evidenced by patient will adhere 100 % to her prescribed treatment plan  ?continue to work with RN Care Manager to address care management and care  coordination needs related to  Essential hypertension, Abnormal glucose  as evidenced by adherence to CM Team Scheduled appointments ?demonstrate ongoing self health care management ability   as evidenced by    through collaboration with RN Care manager, provider, and care team.  ? ?Interventions: ?1:1 collaboration with primary care provider regarding development and update of comprehensive plan of care as evidenced by provider attestation and co-signature ?Inter-disciplinary care team collaboration (see longitudinal plan of care) ?Evaluation of current treatment plan related to  self management and patient's adherence to plan as established by provider ? ?Diabetes Interventions:  (Status:  Goal on track:  Yes.) Long Term Goal ?Assessed patient's understanding of A1c goal:  <5.7% ?Provided education to patient about basic DM disease process ?Reviewed medications with patient and discussed importance of medication adherence ?Review of patient status, including review of consultants reports, relevant laboratory and other test results, and medications completed ?Educated on dietary and exercise recommendations; Technical sales engineer related to Chair Exercises; Carb Choice list; the Dangers in Skipping Meals  ?Lab Results  ?Component Value Date  ? HGBA1C 6.1 (H) 12/29/2021  ? ?Hypertension Interventions:  (Status:  Goal on track:  Yes.) Long Term Goal ?Last practice recorded BP readings:  ?BP Readings from Last 3 Encounters:  ?12/30/21 126/78  ?12/29/21 128/66  ?07/03/21 (!) 122/58  ?Most recent eGFR/CrCl:  ?Lab Results  ?Component Value Date  ? EGFR 82 12/29/2021  ?  No components found for: CRCL ?Evaluation of current treatment plan related to hypertension self management and patient's adherence to plan as established by provider ?Reviewed medications with patient and discussed importance of medication adherence, reviewed PCP recommendations to d/c Losartan/HCTZ and start Losartan monotherapy,  determined patient did not receive new Rx for Losartan ?Sent secure message to Dr. Baird Cancer requesting new Rx for Losartan  ?Advised patient, providing education and rationale, to monitor blood pressure daily and record, calling PCP for findings outside established parameters ?Counseled on the importance of exercise goals with target of 150 minutes per week ?Provided education on prescribed diet low Sodium ?Mailed printed educational materials related to Why Should I Limit Sodium?  ? ?Patient Goals/Self-Care Activities: ?Take all medications as prescribed ?Attend all scheduled provider appointments ?Call pharmacy for medication refills 3-7 days in advance of running out of medications ?Perform all self care activities independently  ?Perform IADL's (shopping, preparing meals, housekeeping, managing finances) independently ?Call provider office for new concerns or questions  ?drink 6 to 8 glasses of water each day ?manage portion size ?check blood pressure 3 times per week ?write blood pressure results in a log or diary ?keep a blood pressure log ?take blood pressure log to all doctor appointments ?call doctor for signs and symptoms of high blood pressure ?take medications for blood pressure exactly as prescribed ?begin an exercise program ?report new symptoms to your doctor ? ?Follow Up Plan:  Telephone follow up appointment with care management team member scheduled for:  03/03/22   ?  ? ?Barb Merino, RN, BSN, CCM ?Care Management Coordinator ?[place] [facility]  ?Direct Phone: [phone] ? ? ? ? ? ? ? ? ? ?

## 2021-12-31 NOTE — Patient Instructions (Signed)
Visit Information ? ?Thank you for taking time to visit with me today. Please don't hesitate to contact me if I can be of assistance to you before our next scheduled telephone appointment. ? ?Following are the goals we discussed today:  ?(Copy and paste patient goals from clinical care plan here) ? ?Our next appointment is by telephone on 03/03/22 at 12 PM ? ?Please call the care guide team at [phone] if you need to cancel or reschedule your appointment.  ? ?If you are experiencing a Mental Health or [place] or need someone to talk to, please call [phone]-TALK (toll free, 24 hour hotline)  ? ?The patient verbalized understanding of instructions, educational materials, and care plan provided today and agreed to receive a mailed copy of patient instructions, educational materials, and care plan.  ? ? ?Barb Merino, RN, BSN, CCM ?Care Management Coordinator ?[place] [facility]  ?Direct Phone: [phone] ? ? ?

## 2022-01-01 ENCOUNTER — Other Ambulatory Visit: Payer: Self-pay | Admitting: Internal Medicine

## 2022-01-02 DIAGNOSIS — I10 Essential (primary) hypertension: Secondary | ICD-10-CM

## 2022-02-11 ENCOUNTER — Other Ambulatory Visit: Payer: Self-pay | Admitting: Internal Medicine

## 2022-03-03 ENCOUNTER — Telehealth: Payer: Medicare Other

## 2022-03-03 ENCOUNTER — Ambulatory Visit (INDEPENDENT_AMBULATORY_CARE_PROVIDER_SITE_OTHER): Payer: Medicare Other

## 2022-03-03 DIAGNOSIS — R7309 Other abnormal glucose: Secondary | ICD-10-CM

## 2022-03-03 DIAGNOSIS — I10 Essential (primary) hypertension: Secondary | ICD-10-CM

## 2022-03-03 NOTE — Chronic Care Management (AMB) (Signed)
Chronic Care Management   CCM RN Visit Note  03/03/2022 Name: Erica Meadows MRN: [id number] DOB: [birth year]  Subjective: Erica Meadows is a 86 y.o. year old female who is a primary care patient of Glendale Chard, MD. The care management team was consulted for assistance with disease management and care coordination needs.    Engaged with patient by telephone for follow up visit in response to provider referral for case management and/or care coordination services.   Consent to Services:  The patient was given information about Chronic Care Management services, agreed to services, and gave verbal consent prior to initiation of services.  Please see initial visit note for detailed documentation.   Patient agreed to services and verbal consent obtained.   Assessment: Review of patient past medical history, allergies, medications, health status, including review of consultants reports, laboratory and other test data, was performed as part of comprehensive evaluation and provision of chronic care management services.   SDOH (Social Determinants of Health) assessments and interventions performed:  no acute needs   CCM Care Plan  No Known Allergies  Outpatient Encounter Medications as of 03/03/2022  Medication Sig   acetaminophen (TYLENOL) 500 MG tablet Take 500 mg by mouth every 6 (six) hours as needed. prn   amLODipine (NORVASC) 10 MG tablet TAKE 1 TABLET BY MOUTH EVERY DAY   aspirin 81 MG tablet Take 81 mg by mouth every evening.    carvedilol (COREG) 12.5 MG tablet TAKE 1 TABLET BY MOUTH TWICE A DAY WITH FOOD   cholecalciferol (VITAMIN D) 1000 units tablet Take 5,000 Units by mouth 2 (two) times a week.   furosemide (LASIX) 20 MG tablet TAKE 1 TABLET BY MOUTH EVERY DAY   KLOR-CON M20 20 MEQ tablet TAKE 1 TABLET BY MOUTH EVERY DAY   losartan (COZAAR) 100 MG tablet Take 1 tablet (100 mg total) by mouth daily.   rosuvastatin (CRESTOR) 10 MG tablet TAKE 1 TABLET BY MOUTH EVERY DAY    No facility-administered encounter medications on file as of 03/03/2022.    Patient Active Problem List   Diagnosis Date Noted   Chronic pain of both shoulders 12/29/2021   Hypercalcemia 12/29/2021   Other abnormal glucose 06/30/2021   Lower extremity edema 06/30/2021   Osteopenia of lumbar spine 06/30/2021   Vaginal discharge 11/01/2018   Essential hypertension 04/01/2018   Hyperlipidemia 04/01/2018   Pain and swelling of left lower extremity 04/01/2018    Conditions to be addressed/monitored: Essential hypertension, Abnormal glucose   Care Plan : [place] of Care  Updates made by Lynne Logan, RN since 03/03/2022 12:00 AM     Problem: No plan of care established for management of chronic disease states (Essential hypertension, Abnormal glucose)   Priority: High     Long-Range Goal: Establishment of plan of care for management of chronic disease states (Essential hypertension, Abnormal glucose)   Note:   Current Barriers:  Knowledge Deficits related to plan of care for management of Essential hypertension, Abnormal glucose   Chronic Disease Management support and education needs related to Essential hypertension, Abnormal glucose    RNCM Clinical Goal(s):  Patient will verbalize basic understanding of  Essential hypertension, Abnormal glucose disease process and self health management plan as evidenced by patient will experience no disease exacerbations related to her chronic disease states as listed above  take all medications exactly as prescribed and will call provider for medication related questions as evidenced by demonstrate improved understanding of prescribed medications  and rationale for usage as evidenced by patient teach back demonstrate Ongoing health management independence as evidenced by patient will adhere 100 % to her prescribed treatment plan  continue to work with RN Care Manager to address care management and care coordination needs related  to  Essential hypertension, Abnormal glucose  as evidenced by adherence to CM Team Scheduled appointments demonstrate ongoing self health care management ability   as evidenced by    through collaboration with RN Care manager, provider, and care team.   Interventions: 1:1 collaboration with primary care provider regarding development and update of comprehensive plan of care as evidenced by provider attestation and co-signature Inter-disciplinary care team collaboration (see longitudinal plan of care) Evaluation of current treatment plan related to  self management and patient's adherence to plan as established by provider  Diabetes Interventions:  (Status:  Condition stable.  Not addressed this visit.) Long Term Goal Assessed patient's understanding of A1c goal:  <5.7% Provided education to patient about basic DM disease process Reviewed medications with patient and discussed importance of medication adherence Review of patient status, including review of consultants reports, relevant laboratory and other test results, and medications completed Educated on dietary and exercise recommendations; Technical sales engineer related to Chair Exercises; Carb Choice list; the Dangers in Skipping Meals  Lab Results  Component Value Date   HGBA1C 6.1 (H) 12/29/2021   Hypertension Interventions:  (Status:  Goal on track:  Yes.) Long Term Goal Last practice recorded BP readings:  BP Readings from Last 3 Encounters:  12/30/21 126/78  12/29/21 128/66  07/03/21 (!) 122/58  Most recent eGFR/CrCl:  Lab Results  Component Value Date   EGFR 82 12/29/2021    No components found for: CRCL Evaluation of current treatment plan related to hypertension self management and patient's adherence to plan as established by provider Reviewed medications with patient and discussed importance of medication adherence Advised patient, providing education and rationale, to monitor blood pressure daily and  record, calling PCP for findings outside established parameters Determined patient is self monitoring her BP at home daily checking at different times of the day, she reports readings are within target range  Reiterated the importance of following a low Sodium diet  Mailed printed educational material related to the [place] and discussed next scheduled PCP visit with PCP provider Minette Brine FNP set for 07/09/22 @2  PM  Patient Goals/Self-Care Activities: Take all medications as prescribed Attend all scheduled provider appointments Call pharmacy for medication refills 3-7 days in advance of running out of medications Perform all self care activities independently  Perform IADL's (shopping, preparing meals, housekeeping, managing finances) independently Call provider office for new concerns or questions  drink 6 to 8 glasses of water each day manage portion size check blood pressure 3 times per week write blood pressure results in a log or diary keep a blood pressure log take blood pressure log to all doctor appointments call doctor for signs and symptoms of high blood pressure take medications for blood pressure exactly as prescribed begin an exercise program report new symptoms to your doctor  Follow Up Plan:  Telephone follow up appointment with care management team member scheduled for:  07/13/22      Barb Merino, RN, BSN, CCM Care Management Coordinator [place] [facility]  Direct Phone: [phone]

## 2022-03-03 NOTE — Patient Instructions (Signed)
Visit Information  Thank you for taking time to visit with me today. Please don't hesitate to contact me if I can be of assistance to you before our next scheduled telephone appointment.  Following are the goals we discussed today:  (Copy and paste patient goals from clinical care plan here)  Our next appointment is by telephone on 07/13/22 at 11:15 AM   Please call the care guide team at [phone] if you need to cancel or reschedule your appointment.   If you are experiencing a Mental Health or [place] or need someone to talk to, please call 1-800-273-TALK (toll free, 24 hour hotline)   The patient verbalized understanding of instructions, educational materials, and care plan provided today and agreed to receive a mailed copy of patient instructions, educational materials, and care plan.   Barb Merino, RN, BSN, CCM Care Management Coordinator [place] [facility]  Direct Phone: [phone]

## 2022-03-04 DIAGNOSIS — E1169 Type 2 diabetes mellitus with other specified complication: Secondary | ICD-10-CM | POA: Diagnosis not present

## 2022-03-04 DIAGNOSIS — I10 Essential (primary) hypertension: Secondary | ICD-10-CM | POA: Diagnosis not present

## 2022-04-01 ENCOUNTER — Other Ambulatory Visit: Payer: Self-pay | Admitting: Internal Medicine

## 2022-04-02 ENCOUNTER — Other Ambulatory Visit: Payer: Self-pay | Admitting: Internal Medicine

## 2022-06-26 ENCOUNTER — Other Ambulatory Visit: Payer: Self-pay | Admitting: Internal Medicine

## 2022-06-30 ENCOUNTER — Other Ambulatory Visit: Payer: Self-pay | Admitting: Internal Medicine

## 2022-07-09 ENCOUNTER — Encounter: Payer: Self-pay | Admitting: Internal Medicine

## 2022-07-09 ENCOUNTER — Ambulatory Visit (INDEPENDENT_AMBULATORY_CARE_PROVIDER_SITE_OTHER): Payer: Medicare Other | Admitting: Internal Medicine

## 2022-07-09 VITALS — BP 140/64 | HR 63 | Temp 98.0°F | Ht 59.0 in | Wt 124.8 lb

## 2022-07-09 DIAGNOSIS — Z23 Encounter for immunization: Secondary | ICD-10-CM | POA: Diagnosis not present

## 2022-07-09 DIAGNOSIS — R634 Abnormal weight loss: Secondary | ICD-10-CM | POA: Diagnosis not present

## 2022-07-09 DIAGNOSIS — H6122 Impacted cerumen, left ear: Secondary | ICD-10-CM

## 2022-07-09 DIAGNOSIS — E7800 Pure hypercholesterolemia, unspecified: Secondary | ICD-10-CM | POA: Diagnosis not present

## 2022-07-09 DIAGNOSIS — R7309 Other abnormal glucose: Secondary | ICD-10-CM | POA: Diagnosis not present

## 2022-07-09 DIAGNOSIS — Z0000 Encounter for general adult medical examination without abnormal findings: Secondary | ICD-10-CM | POA: Diagnosis not present

## 2022-07-09 DIAGNOSIS — I10 Essential (primary) hypertension: Secondary | ICD-10-CM | POA: Diagnosis not present

## 2022-07-09 DIAGNOSIS — Z6825 Body mass index (BMI) 25.0-25.9, adult: Secondary | ICD-10-CM

## 2022-07-09 LAB — POCT URINALYSIS DIPSTICK
Bilirubin, UA: NEGATIVE
Blood, UA: NEGATIVE
Glucose, UA: NEGATIVE
Ketones, UA: NEGATIVE
Leukocytes, UA: NEGATIVE
Nitrite, UA: NEGATIVE
Protein, UA: NEGATIVE
Spec Grav, UA: 1.015 (ref 1.010–1.025)
Urobilinogen, UA: 1 E.U./dL
pH, UA: 7 (ref 5.0–8.0)

## 2022-07-09 LAB — POC HEMOCCULT BLD/STL (OFFICE/1-CARD/DIAGNOSTIC): Fecal Occult Blood, POC: NEGATIVE

## 2022-07-09 NOTE — Progress Notes (Signed)
Erica Meadows,acting as a Education administrator for Erica Greenland, MD.,have documented all relevant documentation on the behalf of Erica Greenland, MD,as directed by  Erica Greenland, MD while in the presence of Erica Greenland, MD.   Subjective:     Patient ID: Erica Meadows , female    DOB: 1936-07-15 , 86 y.o.   MRN: [id number]   Chief Complaint  Patient presents with   Annual Exam   Hypertension    HPI  Patient presents today for her HM. She is no longer followed by GYN. She reports compliance with meds. She denies headaches, chest pain and shortness of breath. She offers no complaints.  Hypertension This is a chronic problem. The current episode started more than 1 year ago. The problem has been gradually improving since onset. The problem is controlled. Risk factors for coronary artery disease include post-menopausal state and sedentary lifestyle. The current treatment provides moderate improvement.     Past Medical History:  Diagnosis Date   High cholesterol    Hyperparathyroidism ([place])    Hypertension    under control; has been on med. x 15-20 yr.   Seasonal allergies    Trigger thumb of left hand 12/2012     Family History  Problem Relation Age of Onset   Hypertension Mother    Stroke Mother    Stroke Father    Hypertension Father      Current Outpatient Medications:    acetaminophen (TYLENOL) 500 MG tablet, Take 500 mg by mouth every 6 (six) hours as needed. prn, Disp: , Rfl:    amLODipine (NORVASC) 10 MG tablet, TAKE 1 TABLET BY MOUTH EVERY DAY, Disp: 90 tablet, Rfl: 1   aspirin 81 MG tablet, Take 81 mg by mouth every evening. , Disp: , Rfl:    carvedilol (COREG) 12.5 MG tablet, TAKE 1 TABLET BY MOUTH TWICE A DAY WITH FOOD, Disp: 180 tablet, Rfl: 1   cholecalciferol (VITAMIN D) 1000 units tablet, Take 5,000 Units by mouth 2 (two) times a week., Disp: , Rfl:    furosemide (LASIX) 20 MG tablet, TAKE 1 TABLET BY MOUTH EVERY DAY, Disp: 90 tablet, Rfl: 1   KLOR-CON  M20 20 MEQ tablet, TAKE 1 TABLET BY MOUTH EVERY DAY, Disp: 90 tablet, Rfl: 1   rosuvastatin (CRESTOR) 10 MG tablet, TAKE 1 TABLET BY MOUTH EVERY DAY, Disp: 90 tablet, Rfl: 1   valsartan (DIOVAN) 160 MG tablet, Take 1 tablet (160 mg total) by mouth daily., Disp: 90 tablet, Rfl: 3   No Known Allergies    The patient states she uses post menopausal status for birth control. Last LMP was No LMP recorded. Patient has had a hysterectomy.. Negative for Dysmenorrhea. Negative for: breast discharge, breast lump(s), breast pain and breast self exam. Associated symptoms include abnormal vaginal bleeding. Pertinent negatives include abnormal bleeding (hematology), anxiety, decreased libido, depression, difficulty falling sleep, dyspareunia, history of infertility, nocturia, sexual dysfunction, sleep disturbances, urinary incontinence, urinary urgency, vaginal discharge and vaginal itching. Diet regular.The patient states her exercise level is  intermittent.  . The patient's tobacco use is:  Social History   Tobacco Use  Smoking Status Never  Smokeless Tobacco Never  . She has been exposed to passive smoke. The patient's alcohol use is:  Social History   Substance and Sexual Activity  Alcohol Use No    Review of Systems  Constitutional:  Positive for unexpected weight change.       She c/o unintentional weight loss. She  is not sure what is contributing to her sx. She does not think she has had change in her appetite/sleep habits.   HENT: Negative.    Eyes: Negative.   Respiratory: Negative.    Cardiovascular: Negative.   Gastrointestinal: Negative.   Endocrine: Negative.   Genitourinary: Negative.   Musculoskeletal: Negative.   Skin: Negative.   Allergic/Immunologic: Negative.   Neurological: Negative.   Hematological: Negative.   Psychiatric/Behavioral: Negative.       Today's Vitals   07/09/22 1419  BP: (!) 140/64  Pulse: 63  Temp: 98 F (36.7 C)  Weight: 124 lb 12.8 oz (56.6 kg)   Height: 4' 11"  (1.499 m)  PainSc: 0-No pain   Body mass index is 25.21 kg/m.  Wt Readings from Last 3 Encounters:  07/09/22 124 lb 12.8 oz (56.6 kg)  12/30/21 138 lb (62.6 kg)  12/29/21 138 lb 3.2 oz (62.7 kg)     Objective:  Physical Exam Vitals and nursing note reviewed.  Constitutional:      Appearance: Normal appearance.  HENT:     Head: Normocephalic and atraumatic.     Right Ear: Tympanic membrane, ear canal and external ear normal.     Left Ear: Ear canal and external ear normal. There is impacted cerumen.     Nose:     Comments: Masked     Mouth/Throat:     Comments: Masked  Eyes:     Extraocular Movements: Extraocular movements intact.     Conjunctiva/sclera: Conjunctivae normal.     Pupils: Pupils are equal, round, and reactive to light.  Cardiovascular:     Rate and Rhythm: Normal rate and regular rhythm.     Pulses: Normal pulses.     Heart sounds: Normal heart sounds.  Pulmonary:     Effort: Pulmonary effort is normal.     Breath sounds: Normal breath sounds.  Chest:  Breasts:    Tanner Score is 5.     Right: Normal.     Left: Normal.  Abdominal:     General: Bowel sounds are normal.     Palpations: Abdomen is soft.  Genitourinary:    Comments: deferred Musculoskeletal:        General: Normal range of motion.     Cervical back: Normal range of motion and neck supple.  Skin:    General: Skin is warm and dry.  Neurological:     General: No focal deficit present.     Mental Status: She is alert and oriented to person, place, and time.  Psychiatric:        Mood and Affect: Mood normal.        Behavior: Behavior normal.         Assessment And Plan:     1. Encounter for general adult medical examination w/o abnormal findings Comments: A full exam was performed. Importance of monthly self breast exams was discussed with the patient. PATIENT IS ADVISED TO GET 30-45 MINUTES REGULAR EXERCISE NO LESS THAN FOUR TO FIVE DAYS PER WEEK - BOTH  WEIGHTBEARING EXERCISES AND AEROBIC ARE RECOMMENDED.  PATIENT IS ADVISED TO FOLLOW A HEALTHY DIET WITH AT LEAST SIX FRUITS/VEGGIES PER DAY, DECREASE INTAKE OF RED MEAT, AND TO INCREASE FISH INTAKE TO TWO DAYS PER WEEK.  MEATS/FISH SHOULD NOT BE FRIED, BAKED OR BROILED IS PREFERABLE.  IT IS ALSO IMPORTANT TO CUT BACK ON YOUR SUGAR INTAKE. PLEASE AVOID ANYTHING WITH ADDED SUGAR, CORN SYRUP OR OTHER SWEETENERS. IF YOU MUST USE A SWEETENER, YOU  CAN TRY STEVIA. IT IS ALSO IMPORTANT TO AVOID ARTIFICIALLY SWEETENERS AND DIET BEVERAGES. LASTLY, I SUGGEST WEARING SPF 50 SUNSCREEN ON EXPOSED PARTS AND ESPECIALLY WHEN IN THE DIRECT SUNLIGHT FOR AN EXTENDED PERIOD OF TIME.  PLEASE AVOID FAST FOOD RESTAURANTS AND INCREASE YOUR WATER INTAKE.  2. Essential hypertension Comments: Chronic, uncontrolled. EKG performed, SB w/ old anteroseptal infarct, nonspecific ST depression. She will c/w carvedilol 12.85m bid and furosemide 259mqd. She agrees to rto in 4-6 months for re-evaluation.  - POCT Urinalysis Dipstick (81002) - Microalbumin / Creatinine Urine Ratio - EKG 12-Lead - CBC - CMP14+EGFR - Lipid panel - Amb Referral To Provider Referral Exercise Program (P.R.E.P)  3. Weight loss, unintentional Comments: She has lost 14 lbs since March 2023. I think her sx are due to decreased appetite. I will refer her to Nutrition for further dietary counseling., her meals need to consistent of nutrition dense foods since her food intake is limited.  DRE performed, heme negative.  I will check labs as below.  - CBC - CMP14+EGFR - Prealbumin - TSH - POC Hemoccult Bld/Stl (1-Cd Office Dx) - Referral to Nutrition and Diabetes Services  4. Left ear impacted cerumen Comments: She declined irrigation. She will get something from OTC that will help to flush her ears.  5. Pure hypercholesterolemia Chronic, she is currently on rosuvastatin 1021maily.  Will refer her to PREP program.  - Lipid panel - Amb Referral To Provider  Referral Exercise Program (P.R.E.P)  6. Other abnormal glucose Comments: Her a1c has been elevated in the past, I will recheck this today. She is encouraged to limit her intake of sugary beverages, including diet drinks.  - Hemoglobin A1c  7. BMI 25.0-25.9,adult Comments: She is encouraged to aim for at least 150 minutes of exercise per week.  - Amb Referral To Provider Referral Exercise Program (P.R.E.P)  8. Immunization due - Flu Vaccine QUAD High Dose(Fluad)  Patient was given opportunity to ask questions. Patient verbalized understanding of the plan and was able to repeat key elements of the plan. All questions were answered to their satisfaction.   I, RobMaximino GreenlandD, have reviewed all documentation for this visit. The documentation on 07/09/22 for the exam, diagnosis, procedures, and orders are all accurate and complete.   THE PATIENT IS ENCOURAGED TO PRACTICE SOCIAL DISTANCING DUE TO THE COVID-19 PANDEMIC.

## 2022-07-09 NOTE — Patient Instructions (Signed)

## 2022-07-10 LAB — CBC
Hematocrit: 36.3 % (ref 34.0–46.6)
Hemoglobin: 12.1 g/dL (ref 11.1–15.9)
MCH: 29.9 pg (ref 26.6–33.0)
MCHC: 33.3 g/dL (ref 31.5–35.7)
MCV: 90 fL (ref 79–97)
Platelets: 262 10*3/uL (ref 150–450)
RBC: 4.05 x10E6/uL (ref 3.77–5.28)
RDW: 12.3 % (ref 11.7–15.4)
WBC: 5.4 10*3/uL (ref 3.4–10.8)

## 2022-07-10 LAB — CMP14+EGFR
ALT: 10 IU/L (ref 0–32)
AST: 19 IU/L (ref 0–40)
Albumin/Globulin Ratio: 2 (ref 1.2–2.2)
Albumin: 4.7 g/dL (ref 3.7–4.7)
Alkaline Phosphatase: 97 IU/L (ref 44–121)
BUN/Creatinine Ratio: 11 — ABNORMAL LOW (ref 12–28)
BUN: 9 mg/dL (ref 8–27)
Bilirubin Total: 0.5 mg/dL (ref 0.0–1.2)
CO2: 27 mmol/L (ref 20–29)
Calcium: 10.4 mg/dL — ABNORMAL HIGH (ref 8.7–10.3)
Chloride: 101 mmol/L (ref 96–106)
Creatinine, Ser: 0.79 mg/dL (ref 0.57–1.00)
Globulin, Total: 2.3 g/dL (ref 1.5–4.5)
Glucose: 104 mg/dL — ABNORMAL HIGH (ref 70–99)
Potassium: 3.5 mmol/L (ref 3.5–5.2)
Sodium: 143 mmol/L (ref 134–144)
Total Protein: 7 g/dL (ref 6.0–8.5)
eGFR: 74 mL/min/{1.73_m2} (ref >59)

## 2022-07-10 LAB — HEMOGLOBIN A1C
Est. average glucose Bld gHb Est-mCnc: 126 mg/dL
Hgb A1c MFr Bld: 6 % — ABNORMAL HIGH (ref 4.8–5.6)

## 2022-07-10 LAB — LIPID PANEL
Chol/HDL Ratio: 2.5 ratio (ref 0.0–4.4)
Cholesterol, Total: 167 mg/dL (ref 100–199)
HDL: 67 mg/dL (ref >39)
LDL Chol Calc (NIH): 85 mg/dL (ref 0–99)
Triglycerides: 81 mg/dL (ref 0–149)
VLDL Cholesterol Cal: 15 mg/dL (ref 5–40)

## 2022-07-10 LAB — PREALBUMIN: PREALBUMIN: 18 mg/dL (ref 9–32)

## 2022-07-10 LAB — MICROALBUMIN / CREATININE URINE RATIO
Creatinine, Urine: 39.8 mg/dL
Microalb/Creat Ratio: 25 mg/g creat (ref 0–29)
Microalbumin, Urine: 10.1 ug/mL

## 2022-07-10 LAB — TSH: TSH: 0.85 u[IU]/mL (ref 0.450–4.500)

## 2022-07-13 ENCOUNTER — Ambulatory Visit: Payer: Self-pay

## 2022-07-13 NOTE — Patient Outreach (Signed)
  Care Coordination   07/13/2022 Name: Erica Meadows MRN: [id number] DOB: Mar 24, 1936   Care Coordination Outreach Attempts:  An unsuccessful telephone outreach was attempted for a scheduled appointment today.  Follow Up Plan:  Additional outreach attempts will be made to offer the patient care coordination information and services.   Encounter Outcome:  No Answer  Care Coordination Interventions Activated:  No   Care Coordination Interventions:  No, not indicated    Barb Merino, RN, BSN, CCM Care Management Coordinator [place] [facility]  Direct Phone: [phone]

## 2022-07-16 ENCOUNTER — Other Ambulatory Visit: Payer: Medicare Other

## 2022-07-16 ENCOUNTER — Other Ambulatory Visit: Payer: Self-pay

## 2022-07-16 DIAGNOSIS — I10 Essential (primary) hypertension: Secondary | ICD-10-CM

## 2022-07-16 DIAGNOSIS — Z9189 Other specified personal risk factors, not elsewhere classified: Secondary | ICD-10-CM

## 2022-07-17 LAB — CALCIUM, IONIZED: Calcium, Ion: 5.8 mg/dL — ABNORMAL HIGH (ref 4.5–5.6)

## 2022-07-17 LAB — PTH, INTACT AND CALCIUM
Calcium: 10.7 mg/dL — ABNORMAL HIGH (ref 8.7–10.3)
PTH: 52 pg/mL (ref 15–65)

## 2022-07-17 LAB — PHOSPHORUS: Phosphorus: 3.1 mg/dL (ref 3.0–4.3)

## 2022-07-21 ENCOUNTER — Other Ambulatory Visit: Payer: Self-pay

## 2022-07-21 MED ORDER — VALSARTAN 160 MG PO TABS: 160.0000 mg | ORAL_TABLET | Freq: Every day | ORAL | 3 refills | Status: DC

## 2022-07-24 ENCOUNTER — Telehealth: Payer: Self-pay

## 2022-07-24 NOTE — Telephone Encounter (Signed)
VMT pt reference PREP referral LM requesting call back

## 2022-08-11 ENCOUNTER — Ambulatory Visit: Payer: Medicare Other

## 2022-08-11 VITALS — BP 136/58 | HR 62

## 2022-08-11 DIAGNOSIS — I10 Essential (primary) hypertension: Secondary | ICD-10-CM

## 2022-08-11 NOTE — Progress Notes (Signed)
Patient presents today for BPC. She is currently taking Amlodipine '10mg'$  Lasix'20mg'$   coreg 12.'5mg'$  and valsartan '160mg'$ . She did bring her personal cuff with her today with reading of 157/62.  BP Readings from Last 3 Encounters:  08/11/22 (!) 136/58  07/09/22 (!) 140/64  12/30/21 126/78   Patient advised to take Valsartan Lasix and Coreg in the morning and Amlodipine and Coreg at Night. Pt will follow up with provider on 08/25/22.

## 2022-08-17 ENCOUNTER — Ambulatory Visit: Payer: Self-pay

## 2022-08-17 NOTE — Patient Instructions (Signed)
Visit Information  Thank you for taking time to visit with me today. Please don't hesitate to contact me if I can be of assistance to you.   Following are the goals we discussed today:   Goals Addressed               This Visit's Progress     Patient Stated     I am monitoring my blood pressure (pt-stated)        Care Coordination Interventions: Evaluation of current treatment plan related to hypertension self management and patient's adherence to plan as established by provider Reviewed medications with patient and discussed importance of compliance Advised patient, providing education and rationale, to monitor blood pressure daily and record, calling PCP for findings outside established parameters Reviewed upcoming scheduled PCP follow up for BP check scheduled for 08/25/22 @ 4 PM Informed patient of missed call from nurse Winifred Olive regarding start up of PREP, provided patient with the contact number for Pam and encouraged patient to contact patient to get her Y classes set up           [place] next appointment is by telephone on 10/16/22 at 0100 PM  Please call the care guide team at [phone] if you need to cancel or reschedule your appointment.   If you are experiencing a Mental Health or [place] or need someone to talk to, please call 1-800-273-TALK (toll free, 24 hour hotline)  The patient verbalized understanding of instructions, educational materials, and care plan provided today and agreed to receive a mailed copy of patient instructions, educational materials, and care plan.   Barb Merino, RN, BSN, CCM Care Management Coordinator [facility] Care Management Direct Phone: [phone]

## 2022-08-17 NOTE — Patient Outreach (Signed)
  Care Coordination   Follow Up Visit Note   08/17/2022 Name: Erica Meadows MRN: [id number] DOB: Feb 09, 1936  Erica Meadows is a 86 y.o. year old female who sees Glendale Chard, MD for primary care. I spoke with  Mervin Hack by phone today.  What matters to the patients health and wellness today?  Patient will continue to monitor her blood pressure at home.     Goals Addressed               This Visit's Progress     Patient Stated     I am monitoring my blood pressure (pt-stated)        Care Coordination Interventions: Evaluation of current treatment plan related to hypertension self management and patient's adherence to plan as established by provider Reviewed medications with patient and discussed importance of compliance Advised patient, providing education and rationale, to monitor blood pressure daily and record, calling PCP for findings outside established parameters Reviewed upcoming scheduled PCP follow up for BP check scheduled for 08/25/22 @ 4 PM Informed patient of missed call from nurse Winifred Olive regarding start up of PREP, provided patient with the contact number for Pam and encouraged patient to contact patient to get her Y classes set up           SDOH assessments and interventions completed:  No     Care Coordination Interventions Activated:  Yes  Care Coordination Interventions:  Yes, provided   Follow up plan: Follow up call scheduled for 10/16/22 '@100'$  PM    Encounter Outcome:  Pt. Visit Completed

## 2022-08-18 ENCOUNTER — Telehealth: Payer: Self-pay

## 2022-08-18 NOTE — Telephone Encounter (Signed)
Call from pt reference PREP program. Described program to pt. Interested in participating. Can start at [facility]. Would prefer a 1pm class. Next class will start in Jan. Will call her closer to start of class to firm up date/time and do intake.

## 2022-08-19 ENCOUNTER — Ambulatory Visit: Payer: Medicare Other | Admitting: Internal Medicine

## 2022-08-19 ENCOUNTER — Ambulatory Visit (INDEPENDENT_AMBULATORY_CARE_PROVIDER_SITE_OTHER): Payer: Medicare Other

## 2022-08-19 VITALS — BP 118/60 | HR 73 | Temp 98.0°F | Ht 59.2 in | Wt 127.2 lb

## 2022-08-19 DIAGNOSIS — Z0000 Encounter for general adult medical examination without abnormal findings: Secondary | ICD-10-CM

## 2022-08-19 NOTE — Patient Instructions (Signed)
Erica Meadows , Thank you for taking time to come for your Medicare Wellness Visit. I appreciate your ongoing commitment to your health goals. Please review the following plan we discussed and let me know if I can assist you in the future.   Screening recommendations/referrals: Colonoscopy: not required Mammogram: completed 09/02/2021, due 09/03/2022 Bone Density: completed 09/02/2021 Recommended yearly ophthalmology/optometry visit for glaucoma screening and checkup Recommended yearly dental visit for hygiene and checkup  Vaccinations: Influenza vaccine: completed 07/09/2022 Pneumococcal vaccine: completed 12/31/2019 Tdap vaccine: completed 03/03/2018, due 03/03/2028 Shingles vaccine: completed   Covid-19: 06/20/2021, 09/16/2020, 01/03/2020, 12/07/2019  Advanced directives: Please bring a copy of your POA (Power of Attorney) and/or Living Will to your next appointment.   Conditions/risks identified: none  Next appointment: Follow up in one year for your annual wellness visit    Preventive Care 65 Years and Older, Female Preventive care refers to lifestyle choices and visits with your health care provider that can promote health and wellness. What does preventive care include? A yearly physical exam. This is also called an annual well check. Dental exams once or twice a year. Routine eye exams. Ask your health care provider how often you should have your eyes checked. Personal lifestyle choices, including: Daily care of your teeth and gums. Regular physical activity. Eating a healthy diet. Avoiding tobacco and drug use. Limiting alcohol use. Practicing safe sex. Taking low-dose aspirin every day. Taking vitamin and mineral supplements as recommended by your health care provider. What happens during an annual well check? The services and screenings done by your health care provider during your annual well check will depend on your age, overall health, lifestyle risk factors, and family  history of disease. Counseling  Your health care provider may ask you questions about your: Alcohol use. Tobacco use. Drug use. Emotional well-being. Home and relationship well-being. Sexual activity. Eating habits. History of falls. Memory and ability to understand (cognition). Work and work Statistician. Reproductive health. Screening  You may have the following tests or measurements: Height, weight, and BMI. Blood pressure. Lipid and cholesterol levels. These may be checked every 5 years, or more frequently if you are over 96 years old. Skin check. Lung cancer screening. You may have this screening every year starting at age 5 if you have a 30-pack-year history of smoking and currently smoke or have quit within the past 15 years. Fecal occult blood test (FOBT) of the stool. You may have this test every year starting at age 68. Flexible sigmoidoscopy or colonoscopy. You may have a sigmoidoscopy every 5 years or a colonoscopy every 10 years starting at age 42. Hepatitis C blood test. Hepatitis B blood test. Sexually transmitted disease (STD) testing. Diabetes screening. This is done by checking your blood sugar (glucose) after you have not eaten for a while (fasting). You may have this done every 1-3 years. Bone density scan. This is done to screen for osteoporosis. You may have this done starting at age 62. Mammogram. This may be done every 1-2 years. Talk to your health care provider about how often you should have regular mammograms. Talk with your health care provider about your test results, treatment options, and if necessary, the need for more tests. Vaccines  Your health care provider may recommend certain vaccines, such as: Influenza vaccine. This is recommended every year. Tetanus, diphtheria, and acellular pertussis (Tdap, Td) vaccine. You may need a Td booster every 10 years. Zoster vaccine. You may need this after age 39. Pneumococcal 13-valent conjugate (PCV13)  vaccine. One dose is recommended after age 24. Pneumococcal polysaccharide (PPSV23) vaccine. One dose is recommended after age 53. Talk to your health care provider about which screenings and vaccines you need and how often you need them. This information is not intended to replace advice given to you by your health care provider. Make sure you discuss any questions you have with your health care provider. Document Released: 10/18/2015 Document Revised: 06/10/2016 Document Reviewed: 07/23/2015 Elsevier Interactive Patient Education  2017 [place] Prevention in the Home Falls can cause injuries. They can happen to people of all ages. There are many things you can do to make your home safe and to help prevent falls. What can I do on the outside of my home? Regularly fix the edges of walkways and driveways and fix any cracks. Remove anything that might make you trip as you walk through a door, such as a raised step or threshold. Trim any bushes or trees on the path to your home. Use bright outdoor lighting. Clear any walking paths of anything that might make someone trip, such as rocks or tools. Regularly check to see if handrails are loose or broken. Make sure that both sides of any steps have handrails. Any raised decks and porches should have guardrails on the edges. Have any leaves, snow, or ice cleared regularly. Use sand or salt on walking paths during winter. Clean up any spills in your garage right away. This includes oil or grease spills. What can I do in the bathroom? Use night lights. Install grab bars by the toilet and in the tub and shower. Do not use towel bars as grab bars. Use non-skid mats or decals in the tub or shower. If you need to sit down in the shower, use a plastic, non-slip stool. Keep the floor dry. Clean up any water that spills on the floor as soon as it happens. Remove soap buildup in the tub or shower regularly. Attach bath mats securely with  double-sided non-slip rug tape. Do not have throw rugs and other things on the floor that can make you trip. What can I do in the bedroom? Use night lights. Make sure that you have a light by your bed that is easy to reach. Do not use any sheets or blankets that are too big for your bed. They should not hang down onto the floor. Have a firm chair that has side arms. You can use this for support while you get dressed. Do not have throw rugs and other things on the floor that can make you trip. What can I do in the kitchen? Clean up any spills right away. Avoid walking on wet floors. Keep items that you use a lot in easy-to-reach places. If you need to reach something above you, use a strong step stool that has a grab bar. Keep electrical cords out of the way. Do not use floor polish or wax that makes floors slippery. If you must use wax, use non-skid floor wax. Do not have throw rugs and other things on the floor that can make you trip. What can I do with my stairs? Do not leave any items on the stairs. Make sure that there are handrails on both sides of the stairs and use them. Fix handrails that are broken or loose. Make sure that handrails are as long as the stairways. Check any carpeting to make sure that it is firmly attached to the stairs. Fix any carpet that is loose or  worn. Avoid having throw rugs at the top or bottom of the stairs. If you do have throw rugs, attach them to the floor with carpet tape. Make sure that you have a light switch at the top of the stairs and the bottom of the stairs. If you do not have them, ask someone to add them for you. What else can I do to help prevent falls? Wear shoes that: Do not have high heels. Have rubber bottoms. Are comfortable and fit you well. Are closed at the toe. Do not wear sandals. If you use a stepladder: Make sure that it is fully opened. Do not climb a closed stepladder. Make sure that both sides of the stepladder are locked  into place. Ask someone to hold it for you, if possible. Clearly mark and make sure that you can see: Any grab bars or handrails. First and last steps. Where the edge of each step is. Use tools that help you move around (mobility aids) if they are needed. These include: Canes. Walkers. Scooters. Crutches. Turn on the lights when you go into a dark area. Replace any light bulbs as soon as they burn out. Set up your furniture so you have a clear path. Avoid moving your furniture around. If any of your floors are uneven, fix them. If there are any pets around you, be aware of where they are. Review your medicines with your doctor. Some medicines can make you feel dizzy. This can increase your chance of falling. Ask your doctor what other things that you can do to help prevent falls. This information is not intended to replace advice given to you by your health care provider. Make sure you discuss any questions you have with your health care provider. Document Released: 07/18/2009 Document Revised: 02/27/2016 Document Reviewed: 10/26/2014 Elsevier Interactive Patient Education  2017 [employer].

## 2022-08-19 NOTE — Progress Notes (Signed)
Subjective:   Erica Meadows is a 86 y.o. female who presents for Medicare Annual (Subsequent) preventive examination.  Review of Systems     Cardiac Risk Factors include: advanced age (>66mn, >>40women);dyslipidemia;hypertension     Objective:    Today's Vitals   08/19/22 0859  BP: 118/60  Pulse: 73  Temp: 98 F (36.7 C)  TempSrc: Oral  SpO2: 98%  Weight: 127 lb 3.2 oz (57.7 kg)  Height: 4' 11.2" (1.504 m)   Body mass index is 25.52 kg/m.     08/19/2022    9:08 AM 07/03/2021    2:13 PM 05/08/2021    9:38 AM 06/19/2020    4:06 PM 06/14/2019   11:24 AM 08/11/2018   10:33 AM 06/16/2018    7:22 AM  Advanced Directives  Does Patient Have a Medical Advance Directive? Yes No No No No No No  Type of Advance Directive [place] Chart? No - copy requested        Would patient like information on creating a medical advance directive?  No - Patient declined Yes (MAU/Ambulatory/Procedural Areas - Information given) No - Patient declined  No - Patient declined No - Patient declined    Current Medications (verified) Outpatient Encounter Medications as of 08/19/2022  Medication Sig   acetaminophen (TYLENOL) 500 MG tablet Take 500 mg by mouth every 6 (six) hours as needed. prn   amLODipine (NORVASC) 10 MG tablet TAKE 1 TABLET BY MOUTH EVERY DAY   aspirin 81 MG tablet Take 81 mg by mouth every evening.    carvedilol (COREG) 12.5 MG tablet TAKE 1 TABLET BY MOUTH TWICE A DAY WITH FOOD   cholecalciferol (VITAMIN D) 1000 units tablet Take 5,000 Units by mouth 2 (two) times a week.   furosemide (LASIX) 20 MG tablet TAKE 1 TABLET BY MOUTH EVERY DAY   KLOR-CON M20 20 MEQ tablet TAKE 1 TABLET BY MOUTH EVERY DAY   rosuvastatin (CRESTOR) 10 MG tablet TAKE 1 TABLET BY MOUTH EVERY DAY   valsartan (DIOVAN) 160 MG tablet Take 1 tablet (160 mg total) by mouth daily.   No facility-administered encounter medications on file as of  08/19/2022.    Allergies (verified) Patient has no known allergies.   History: Past Medical History:  Diagnosis Date   High cholesterol    [place]    Hypertension    under control; has been on med. x 15-20 yr.   Seasonal allergies    Trigger thumb of left hand 12/2012   Past Surgical History:  Procedure Laterality Date   ABDOMINAL HYSTERECTOMY  1975   partial   HAND  SURGERY  Left 03/2018   ANTERIOR  HAND ;  CUT INTO TENDON ON HAND WHILE CHOPPING CABBAGE ;      hysterectomy partial     PARATHYROIDECTOMY Right 06/16/2018   Procedure: RIGHT INFERIOR PARATHYROIDECTOMY;  Surgeon: GArmandina Gemma MD;  Location: [facility];  Service: General;  Laterality: Right;   TRIGGER FINGER RELEASE Right    TRIGGER FINGER RELEASE Left 12/12/2012   Procedure: RELEASE TRIGGER FINGER/A-1 PULLEY LEFT THUMB;  Surgeon: GWynonia Sours MD;  [place] [facility]  Service: Orthopedics;  Laterality: Left;  ANESTHESIA: IV REGIONAL FAB   Family History  Problem Relation Age of Onset   Hypertension Mother    Stroke Mother    Stroke Father    Hypertension Father  Social History   Socioeconomic History   Marital status: Married    Spouse name: Not on file   Number of children: Not on file   Years of education: Not on file   Highest education level: Not on file  Occupational History   Occupation: retired  Tobacco Use   Smoking status: Never   Smokeless tobacco: Never  Vaping Use   Vaping Use: Never used  Substance and Sexual Activity   Alcohol use: No   Drug use: No   Sexual activity: Not Currently  Other Topics Concern   Not on file  Social History Narrative   Not on file   Social Determinants of Health   Financial Resource Strain: Low Risk  (08/19/2022)   Overall Financial Resource Strain (CARDIA)    Difficulty of Paying Living Expenses: Not hard at all  Food Insecurity: No Food Insecurity (08/19/2022)   Hunger Vital Sign    Worried About Running Out of  Food in the Last Year: Never true    [place] in the Last Year: Never true  Transportation Needs: No Transportation Needs (08/19/2022)   PRAPARE - Hydrologist (Medical): No    Lack of Transportation (Non-Medical): No  Physical Activity: Inactive (08/19/2022)   Exercise Vital Sign    Days of Exercise per Week: 0 days    Minutes of Exercise per Session: 0 min  Stress: No Stress Concern Present (08/19/2022)   [place] of Stress : Not at all  Social Connections: [place] (06/26/2021)   Social Connection and Isolation Panel [NHANES]    Frequency of Communication with Friends and Family: More than three times a week    Frequency of Social Gatherings with Friends and Family: Twice a week    Attends Religious Services: More than 4 times per year    Active Member of [employer] or Organizations: Yes    Attends Archivist Meetings: 1 to 4 times per year    Marital Status: Married    Tobacco Counseling Counseling given: Not Answered   Clinical Intake:  Pre-visit preparation completed: Yes        Nutritional Status: BMI 25 -29 Overweight Nutritional Risks: None Diabetes: No  How often do you need to have someone help you when you read instructions, pamphlets, or other written materials from your doctor or pharmacy?: 1 - Never  Diabetic? no  Interpreter Needed?: No  Information entered by :: NAllen LPN   Activities of Daily Living    08/19/2022    9:09 AM  In your present state of health, do you have any difficulty performing the following activities:  Hearing? 0  Vision? 0  Difficulty concentrating or making decisions? 0  Walking or climbing stairs? 0  Dressing or bathing? 0  Doing errands, shopping? 0  Preparing Food and eating ? N  Using the Toilet? N  In the past six months, have you accidently leaked urine? Y  Do you have problems with  loss of bowel control? N  Managing your Medications? N  Managing your Finances? N  Housekeeping or managing your Housekeeping? N    Patient Care Team: Glendale Chard, MD as PCP - General (Internal Medicine) Rex Kras Claudette Stapler, RN as [place] Management Lavonna Monarch, MD (Inactive) as Consulting Physician (Dermatology)  Indicate any recent Medical Services you may have received from other than [facility] providers in the past  year (date may be approximate).     Assessment:   This is a routine wellness examination for Erica Meadows.  Hearing/Vision screen Vision Screening - Comments:: No regular eye exams, [facility]  Dietary issues and exercise activities discussed: Current Exercise Habits: The patient does not participate in regular exercise at present   Goals Addressed             This Visit's Progress    Patient Stated       08/19/2022, maintain       Depression Screen    08/19/2022    9:09 AM 07/09/2022    2:20 PM 07/03/2021    2:14 PM 06/26/2021    2:45 PM 06/19/2020    4:08 PM 06/19/2020    3:14 PM 06/14/2019   11:25 AM  PHQ 2/9 Scores  PHQ - 2 Score 0 0 0 0 0 0 0    Fall Risk    08/19/2022    9:09 AM 07/09/2022    2:20 PM 07/03/2021    2:14 PM 06/26/2021    2:45 PM 06/19/2020    4:08 PM  [place] in the past year? 0 0 0 0 0  Number falls in past yr: 0 0  0   Injury with Fall? 0 0  0   Risk for fall due to : Medication side effect No Fall Risks Medication side effect  Medication side effect  Follow up Falls prevention discussed;Education provided;Falls evaluation completed Falls evaluation completed Falls evaluation completed;Education provided;Falls prevention discussed  Falls evaluation completed;Education provided;Falls prevention discussed    FALL RISK PREVENTION PERTAINING TO THE HOME:  Any stairs in or around the home? Yes  If so, are there any without handrails? No  Home free of loose throw rugs in walkways, pet beds,  electrical cords, etc? Yes  Adequate lighting in your home to reduce risk of falls? Yes   ASSISTIVE DEVICES UTILIZED TO PREVENT FALLS:  Life alert? No  Use of a cane, walker or w/c? No  Grab bars in the bathroom? Yes  Shower chair or bench in shower? No  Elevated toilet seat or a handicapped toilet? Yes   TIMED UP AND GO:  Was the test performed? Yes .  Length of time to ambulate 10 feet: 5 sec.   Gait steady and fast without use of assistive device  Cognitive Function:        08/19/2022    9:11 AM 07/03/2021    2:16 PM 06/19/2020    4:09 PM 06/14/2019   11:27 AM 08/11/2018   10:37 AM  6CIT Screen  What Year? 0 points 0 points 0 points 0 points 0 points  What month? 0 points 0 points 0 points 0 points 0 points  What time? 0 points 0 points 0 points 0 points 0 points  Count back from 20 0 points 0 points 0 points 0 points 0 points  Months in reverse 0 points 0 points 0 points 0 points 0 points  Repeat phrase 0 points 0 points 0 points 0 points 0 points  Total Score 0 points 0 points 0 points 0 points 0 points    Immunizations Immunization History  Administered Date(s) Administered   Fluad Quad(high Dose 65+) 07/19/2019, 06/19/2020, 07/03/2021, 07/09/2022   Influenza, High Dose Seasonal PF 12/15/2018   Influenza-Unspecified 12/15/2018, 07/19/2019   PFIZER(Purple Top)SARS-COV-2 Vaccination 12/07/2019, 01/03/2020, 09/16/2020, 06/20/2021   PNEUMOCOCCAL CONJUGATE-20 12/30/2021   Pfizer Covid-19 Vaccine Bivalent Booster 14yr & up 06/20/2021  Tdap 03/03/2018   Zoster Recombinat (Shingrix) 05/06/2021, 11/01/2021    TDAP status: Up to date  Flu Vaccine status: Up to date  Pneumococcal vaccine status: Up to date  Covid-19 vaccine status: Completed vaccines  Qualifies for Shingles Vaccine? Yes   Zostavax completed Yes   Shingrix Completed?: Yes  Screening Tests Health Maintenance  Topic Date Due   COVID-19 Vaccine (5 - Pfizer risk series) 08/15/2021   Medicare  Annual Wellness (AWV)  07/03/2022   MAMMOGRAM  09/02/2022   TETANUS/TDAP  03/03/2028   Pneumonia Vaccine 58+ Years old  Completed   INFLUENZA VACCINE  Completed   DEXA SCAN  Completed   Zoster Vaccines- Shingrix  Completed   HPV VACCINES  Aged Out    Health Maintenance  Health Maintenance Due  Topic Date Due   COVID-19 Vaccine (5 - Pfizer risk series) 08/15/2021   Medicare Annual Wellness (AWV)  07/03/2022    Colorectal cancer screening: No longer required.   Mammogram status: Completed 09/02/2021. Repeat every year  Bone Density status: Completed 09/02/2021.   Lung Cancer Screening: (Low Dose CT Chest recommended if Age 12-80 years, 30 pack-year currently smoking OR have quit w/in 15years.) does not qualify.   Lung Cancer Screening Referral: no  Additional Screening:  Hepatitis C Screening: does not qualify;   Vision Screening: Recommended annual ophthalmology exams for early detection of glaucoma and other disorders of the eye. Is the patient up to date with their annual eye exam?  Yes  Who is the provider or what is the name of the office in which the patient attends annual eye exams? [facility] If pt is not established with a provider, would they like to be referred to a provider to establish care? No .   Dental Screening: Recommended annual dental exams for proper oral hygiene  Community Resource Referral / Chronic Care Management: CRR required this visit?  No   CCM required this visit?  No      Plan:     I have personally reviewed and noted the following in the patient's chart:   Medical and social history Use of alcohol, tobacco or illicit drugs  Current medications and supplements including opioid prescriptions. Patient is not currently taking opioid prescriptions. Functional ability and status Nutritional status Physical activity Advanced directives List of other physicians Hospitalizations, surgeries, and ER visits in previous 12  months Vitals Screenings to include cognitive, depression, and falls Referrals and appointments  In addition, I have reviewed and discussed with patient certain preventive protocols, quality metrics, and best practice recommendations. A written personalized care plan for preventive services as well as general preventive health recommendations were provided to patient.     Kellie Simmering, LPN   [id number]   Nurse Notes: none

## 2022-08-23 ENCOUNTER — Other Ambulatory Visit: Payer: Self-pay | Admitting: Internal Medicine

## 2022-08-25 ENCOUNTER — Encounter: Payer: Self-pay | Admitting: Internal Medicine

## 2022-08-25 ENCOUNTER — Ambulatory Visit (INDEPENDENT_AMBULATORY_CARE_PROVIDER_SITE_OTHER): Payer: Medicare Other | Admitting: Internal Medicine

## 2022-08-25 VITALS — BP 122/78 | HR 59 | Temp 98.2°F | Ht 59.2 in | Wt 124.6 lb

## 2022-08-25 DIAGNOSIS — E559 Vitamin D deficiency, unspecified: Secondary | ICD-10-CM | POA: Diagnosis not present

## 2022-08-25 DIAGNOSIS — Z6825 Body mass index (BMI) 25.0-25.9, adult: Secondary | ICD-10-CM

## 2022-08-25 DIAGNOSIS — I10 Essential (primary) hypertension: Secondary | ICD-10-CM

## 2022-08-25 NOTE — Patient Instructions (Signed)
Hypertension, Adult ?Hypertension is another name for high blood pressure. High blood pressure forces your heart to work harder to pump blood. This can cause problems over time. ?There are two numbers in a blood pressure reading. There is a top number (systolic) over a bottom number (diastolic). It is best to have a blood pressure that is below 120/80. ?What are the causes? ?The cause of this condition is not known. Some other conditions can lead to high blood pressure. ?What increases the risk? ?Some lifestyle factors can make you more likely to develop high blood pressure: ?Smoking. ?Not getting enough exercise or physical activity. ?Being overweight. ?Having too much fat, sugar, calories, or salt (sodium) in your diet. ?Drinking too much alcohol. ?Other risk factors include: ?Having any of these conditions: ?Heart disease. ?Diabetes. ?High cholesterol. ?Kidney disease. ?Obstructive sleep apnea. ?Having a family history of high blood pressure and high cholesterol. ?Age. The risk increases with age. ?Stress. ?What are the signs or symptoms? ?High blood pressure may not cause symptoms. Very high blood pressure (hypertensive crisis) may cause: ?Headache. ?Fast or uneven heartbeats (palpitations). ?Shortness of breath. ?Nosebleed. ?Vomiting or feeling like you may vomit (nauseous). ?Changes in how you see. ?Very bad chest pain. ?Feeling dizzy. ?Seizures. ?How is this treated? ?This condition is treated by making healthy lifestyle changes, such as: ?Eating healthy foods. ?Exercising more. ?Drinking less alcohol. ?Your doctor may prescribe medicine if lifestyle changes do not help enough and if: ?Your top number is above 130. ?Your bottom number is above 80. ?Your personal target blood pressure may vary. ?Follow these instructions at home: ?Eating and drinking ? ?If told, follow the DASH eating plan. To follow this plan: ?Fill one half of your plate at each meal with fruits and vegetables. ?Fill one fourth of your plate  at each meal with whole grains. Whole grains include whole-wheat pasta, brown rice, and whole-grain bread. ?Eat or drink low-fat dairy products, such as skim milk or low-fat yogurt. ?Fill one fourth of your plate at each meal with low-fat (lean) proteins. Low-fat proteins include fish, chicken without skin, eggs, beans, and tofu. ?Avoid fatty meat, cured and processed meat, or chicken with skin. ?Avoid pre-made or processed food. ?Limit the amount of salt in your diet to less than 1,500 mg each day. ?Do not drink alcohol if: ?Your doctor tells you not to drink. ?You are pregnant, may be pregnant, or are planning to become pregnant. ?If you drink alcohol: ?Limit how much you have to: ?0-1 drink a day for women. ?0-2 drinks a day for men. ?Know how much alcohol is in your drink. In the [place] one drink equals one 12 oz bottle of beer (355 mL), one 5 oz glass of wine (148 mL), or one 1? oz glass of hard liquor (44 mL). ?Lifestyle ? ?Work with your doctor to stay at a healthy weight or to lose weight. Ask your doctor what the best weight is for you. ?Get at least 30 minutes of exercise that causes your heart to beat faster (aerobic exercise) most days of the week. This may include walking, swimming, or biking. ?Get at least 30 minutes of exercise that strengthens your muscles (resistance exercise) at least 3 days a week. This may include lifting weights or doing Pilates. ?Do not smoke or use any products that contain nicotine or tobacco. If you need help quitting, ask your doctor. ?Check your blood pressure at home as told by your doctor. ?Keep all follow-up visits. ?Medicines ?Take over-the-counter and prescription medicines   only as told by your doctor. Follow directions carefully. ?Do not skip doses of blood pressure medicine. The medicine does not work as well if you skip doses. Skipping doses also puts you at risk for problems. ?Ask your doctor about side effects or reactions to medicines that you should watch  for. ?Contact a doctor if: ?You think you are having a reaction to the medicine you are taking. ?You have headaches that keep coming back. ?You feel dizzy. ?You have swelling in your ankles. ?You have trouble with your vision. ?Get help right away if: ?You get a very bad headache. ?You start to feel mixed up (confused). ?You feel weak or numb. ?You feel faint. ?You have very bad pain in your: ?Chest. ?Belly (abdomen). ?You vomit more than once. ?You have trouble breathing. ?These symptoms may be an emergency. Get help right away. Call 911. ?Do not wait to see if the symptoms will go away. ?Do not drive yourself to the hospital. ?Summary ?Hypertension is another name for high blood pressure. ?High blood pressure forces your heart to work harder to pump blood. ?For most people, a normal blood pressure is less than 120/80. ?Making healthy choices can help lower blood pressure. If your blood pressure does not get lower with healthy choices, you may need to take medicine. ?This information is not intended to replace advice given to you by your health care provider. Make sure you discuss any questions you have with your health care provider. ?Document Revised: 07/10/2021 Document Reviewed: 07/10/2021 ?Elsevier Patient Education ? 2023 Elsevier Inc. ? ?

## 2022-08-25 NOTE — Progress Notes (Signed)
[name],acting as a Education administrator for [name], MD.,have documented all relevant documentation on the behalf of [name], MD,as directed by  [name], MD while in the presence of [name], MD.    Subjective:     Patient ID: Erica Meadows , female    DOB: [birth year] , 86 y.o.   MRN: [id number]   Chief Complaint  Patient presents with   Hypertension    HPI  The patient is here today for a blood pressure follow-up. She reports compliance with meds. She denies headaches, chest pain and shortness of breath.   Hypertension This is a chronic problem. The current episode started more than 1 year ago. The problem has been gradually improving since onset. The problem is controlled. Pertinent negatives include no blurred vision. Risk factors for coronary artery disease include post-menopausal state and sedentary lifestyle. Past treatments include calcium channel blockers, angiotensin blockers and diuretics. The current treatment provides moderate improvement.     Past Medical History:  Diagnosis Date   High cholesterol    Hyperparathyroidism ([place])    Hypertension    under control; has been on med. x 15-20 yr.   Seasonal allergies    Trigger thumb of left hand 12/2012     Family History  Problem Relation Age of Onset   Hypertension Mother    Stroke Mother    Stroke Father    Hypertension Father      Current Outpatient Medications:    acetaminophen (TYLENOL) 500 MG tablet, Take 500 mg by mouth every 6 (six) hours as needed. prn, Disp: , Rfl:    amLODipine (NORVASC) 10 MG tablet, TAKE 1 TABLET BY MOUTH EVERY DAY, Disp: 90 tablet, Rfl: 1   aspirin 81 MG tablet, Take 81 mg by mouth every evening. , Disp: , Rfl:    carvedilol (COREG) 12.5 MG tablet, TAKE 1 TABLET BY MOUTH TWICE A DAY WITH FOOD, Disp: 180 tablet, Rfl: 1   cholecalciferol (VITAMIN D) 1000 units tablet, Take 5,000 Units by mouth 2 (two) times a week., Disp: , Rfl:    furosemide (LASIX)  20 MG tablet, TAKE 1 TABLET BY MOUTH EVERY DAY, Disp: 90 tablet, Rfl: 1   KLOR-CON M20 20 MEQ tablet, TAKE 1 TABLET BY MOUTH EVERY DAY, Disp: 90 tablet, Rfl: 1   rosuvastatin (CRESTOR) 10 MG tablet, TAKE 1 TABLET BY MOUTH EVERY DAY, Disp: 90 tablet, Rfl: 1   valsartan (DIOVAN) 160 MG tablet, Take 1 tablet (160 mg total) by mouth daily., Disp: 90 tablet, Rfl: 3   No Known Allergies   Review of Systems  Constitutional: Negative.   Eyes: Negative.  Negative for blurred vision.  Respiratory: Negative.    Cardiovascular: Negative.   Musculoskeletal: Negative.   Skin: Negative.   Neurological: Negative.   Psychiatric/Behavioral: Negative.       Today's Vitals   08/25/22 1605  BP: 122/78  Pulse: (!) 59  Temp: 98.2 F (36.8 C)  Weight: 124 lb 9.6 oz (56.5 kg)  Height: 4' 11.2" (1.504 m)  PainSc: 0-No pain   Body mass index is 25 kg/m.  Wt Readings from Last 3 Encounters:  08/25/22 124 lb 9.6 oz (56.5 kg)  08/19/22 127 lb 3.2 oz (57.7 kg)  07/09/22 124 lb 12.8 oz (56.6 kg)    Objective:  Physical Exam Vitals and nursing note reviewed.  Constitutional:      Appearance: Normal appearance.  HENT:     Head: Normocephalic and atraumatic.  Nose:     Comments: Masked     Mouth/Throat:     Comments: Masked  Eyes:     Extraocular Movements: Extraocular movements intact.  Cardiovascular:     Rate and Rhythm: Normal rate and regular rhythm.     Heart sounds: Normal heart sounds.  Pulmonary:     Effort: Pulmonary effort is normal.     Breath sounds: Normal breath sounds.  Musculoskeletal:     Cervical back: Normal range of motion.  Skin:    General: Skin is warm.  Neurological:     General: No focal deficit present.     Mental Status: She is alert.  Psychiatric:        Mood and Affect: Mood normal.        Behavior: Behavior normal.       Assessment And Plan:     1. Essential hypertension Comments: Chronic, well controlled. She will c/w valsartan '160mg'$ , furosemide  '40mg'$ , amlodipine '10mg'$  and carvediolol twice daily. - T4, free  2. Hypercalcemia Comments: Persistent. I will check labs as below. She should avoid calcium supplementation for now. - Vitamin D (25 hydroxy) - Protein electrophoresis, serum  3. Vitamin D deficiency disease Comments: I will check a vitamin D level and supplement as needed. - Vitamin D (25 hydroxy)  4. BMI 25.0-25.9,adult Comments: She is encouraged to aim for at least 150 minutes of exercise per week.   Patient was given opportunity to ask questions. Patient verbalized understanding of the plan and was able to repeat key elements of the plan. All questions were answered to their satisfaction.   I, [name], MD, have reviewed all documentation for this visit. The documentation on 08/30/22 for the exam, diagnosis, procedures, and orders are all accurate and complete.   IF YOU HAVE BEEN REFERRED TO A SPECIALIST, IT MAY TAKE 1-2 WEEKS TO SCHEDULE/PROCESS THE REFERRAL. IF YOU HAVE NOT HEARD FROM US/SPECIALIST IN TWO WEEKS, PLEASE GIVE [place] A CALL AT [phone].   THE PATIENT IS ENCOURAGED TO PRACTICE SOCIAL DISTANCING DUE TO THE COVID-19 PANDEMIC.

## 2022-08-31 LAB — PROTEIN ELECTROPHORESIS, SERUM
A/G Ratio: 1.4 (ref 0.7–1.7)
Albumin ELP: 3.9 g/dL (ref 2.9–4.4)
Alpha 1: 0.3 g/dL (ref 0.0–0.4)
Alpha 2: 0.7 g/dL (ref 0.4–1.0)
Beta: 1 g/dL (ref 0.7–1.3)
Gamma Globulin: 0.8 g/dL (ref 0.4–1.8)
Globulin, Total: 2.8 g/dL (ref 2.2–3.9)
Total Protein: 6.7 g/dL (ref 6.0–8.5)

## 2022-08-31 LAB — T4, FREE: Free T4: 1.26 ng/dL (ref 0.82–1.77)

## 2022-08-31 LAB — VITAMIN D 25 HYDROXY (VIT D DEFICIENCY, FRACTURES): Vit D, 25-Hydroxy: 56.5 ng/mL (ref 30.0–100.0)

## 2022-09-08 DIAGNOSIS — Z1231 Encounter for screening mammogram for malignant neoplasm of breast: Secondary | ICD-10-CM | POA: Diagnosis not present

## 2022-09-08 LAB — HM MAMMOGRAPHY: HM Mammogram: NORMAL (ref 0–4)

## 2022-09-26 ENCOUNTER — Other Ambulatory Visit: Payer: Self-pay | Admitting: Internal Medicine

## 2022-10-09 ENCOUNTER — Telehealth: Payer: Self-pay

## 2022-10-09 NOTE — Telephone Encounter (Signed)
Call to pt reference PREP class starting on 11/03/22 T/TH 1p-215p  Confirmed schedule Intake scheduled for 10/27/22 at 4pm

## 2022-10-14 ENCOUNTER — Encounter: Payer: Self-pay | Admitting: Internal Medicine

## 2022-10-14 ENCOUNTER — Ambulatory Visit (INDEPENDENT_AMBULATORY_CARE_PROVIDER_SITE_OTHER): Payer: Medicare Other | Admitting: Internal Medicine

## 2022-10-14 VITALS — BP 124/80 | HR 68 | Temp 98.5°F | Ht 59.2 in | Wt 128.4 lb

## 2022-10-14 DIAGNOSIS — R7309 Other abnormal glucose: Secondary | ICD-10-CM | POA: Diagnosis not present

## 2022-10-14 DIAGNOSIS — I10 Essential (primary) hypertension: Secondary | ICD-10-CM | POA: Diagnosis not present

## 2022-10-14 DIAGNOSIS — M79609 Pain in unspecified limb: Secondary | ICD-10-CM

## 2022-10-14 DIAGNOSIS — R202 Paresthesia of skin: Secondary | ICD-10-CM

## 2022-10-14 DIAGNOSIS — M25531 Pain in right wrist: Secondary | ICD-10-CM

## 2022-10-14 DIAGNOSIS — Z6825 Body mass index (BMI) 25.0-25.9, adult: Secondary | ICD-10-CM

## 2022-10-14 DIAGNOSIS — Z79899 Other long term (current) drug therapy: Secondary | ICD-10-CM | POA: Diagnosis not present

## 2022-10-14 NOTE — Progress Notes (Signed)
[name],acting as a Education administrator for [name], MD.,have documented all relevant documentation on the behalf of [name], MD,as directed by  [name], MD while in the presence of [name], MD.    Subjective:     Patient ID: Erica Meadows , female    DOB: 08/24/1936 , 87 y.o.   MRN: [id number]   Chief Complaint  Patient presents with   Weight Check    HPI  Patient presents today for a BP and weight check. She reports compliance with meds. She denies having any headaches, chest pain and shortness of breath.   Hypertension This is a chronic problem. The current episode started more than 1 year ago. The problem has been gradually improving since onset. The problem is controlled. Pertinent negatives include no blurred vision. Risk factors for coronary artery disease include post-menopausal state and sedentary lifestyle. Past treatments include calcium channel blockers, angiotensin blockers and diuretics. The current treatment provides moderate improvement.     Past Medical History:  Diagnosis Date   High cholesterol    Hyperparathyroidism ([place])    Hypertension    under control; has been on med. x 15-20 yr.   Seasonal allergies    Trigger thumb of left hand 12/2012     Family History  Problem Relation Age of Onset   Hypertension Mother    Stroke Mother    Stroke Father    Hypertension Father      Current Outpatient Medications:    acetaminophen (TYLENOL) 500 MG tablet, Take 500 mg by mouth every 6 (six) hours as needed. prn, Disp: , Rfl:    amLODipine (NORVASC) 10 MG tablet, TAKE 1 TABLET BY MOUTH EVERY DAY, Disp: 90 tablet, Rfl: 1   aspirin 81 MG tablet, Take 81 mg by mouth every evening. , Disp: , Rfl:    carvedilol (COREG) 12.5 MG tablet, TAKE 1 TABLET BY MOUTH TWICE A DAY WITH FOOD, Disp: 180 tablet, Rfl: 1   cholecalciferol (VITAMIN D) 1000 units tablet, Take 5,000 Units by mouth 2 (two) times a week., Disp: , Rfl:    furosemide  (LASIX) 20 MG tablet, TAKE 1 TABLET BY MOUTH EVERY DAY, Disp: 90 tablet, Rfl: 1   KLOR-CON M20 20 MEQ tablet, TAKE 1 TABLET BY MOUTH EVERY DAY, Disp: 90 tablet, Rfl: 1   rosuvastatin (CRESTOR) 10 MG tablet, TAKE 1 TABLET BY MOUTH EVERY DAY, Disp: 90 tablet, Rfl: 1   valsartan (DIOVAN) 160 MG tablet, Take 1 tablet (160 mg total) by mouth daily., Disp: 90 tablet, Rfl: 3   No Known Allergies   Review of Systems  Constitutional: Negative.   Eyes:  Negative for blurred vision.  Respiratory: Negative.    Cardiovascular: Negative.   Gastrointestinal: Negative.   Neurological:  Positive for numbness.  Psychiatric/Behavioral: Negative.       Today's Vitals   10/14/22 1409  BP: 124/80  Pulse: 68  Temp: 98.5 F (36.9 C)  Weight: 128 lb 6.4 oz (58.2 kg)  Height: 4' 11.2" (1.504 m)  PainSc: 0-No pain   Body mass index is 25.76 kg/m.  Wt Readings from Last 3 Encounters:  10/14/22 128 lb 6.4 oz (58.2 kg)  08/25/22 124 lb 9.6 oz (56.5 kg)  08/19/22 127 lb 3.2 oz (57.7 kg)     Objective:  Physical Exam Vitals and nursing note reviewed.  Constitutional:      Appearance: Normal appearance.  HENT:     Head: Normocephalic and atraumatic.  Nose:     Comments: Masked     Mouth/Throat:     Comments: Masked  Eyes:     Extraocular Movements: Extraocular movements intact.  Cardiovascular:     Rate and Rhythm: Normal rate and regular rhythm.     Heart sounds: Normal heart sounds.  Pulmonary:     Effort: Pulmonary effort is normal.     Breath sounds: Normal breath sounds.  Musculoskeletal:     Cervical back: Normal range of motion.     Comments: Neg Tinel's sign, neg Phalen's sign  Skin:    General: Skin is warm.  Neurological:     General: No focal deficit present.     Mental Status: She is alert.  Psychiatric:        Mood and Affect: Mood normal.        Behavior: Behavior normal.      Assessment And Plan:     1. Essential hypertension Comments: Chronic, well controlled.  She will c/w amlodipine, carvedilol and furosemide. Encouraged to follow low sodium diet.  2. BMI 25.0-25.9,adult Comments: She has gained 4 lbs since her last visit. She admits she has increased her protein and healthy carb intake.  3. Hypercalcemia Comments: I will recheck BMP today.  I will order further testing as indicated. - BMP8+EGFR  4. Paresthesia and pain of right extremity Comments: She does not wish to move forward with NCS at this time. She is advised to get wrist splint and to monitor her sx. She will let me know if they persist/worsen.  5. Other abnormal glucose Comments: Her a1c has been elevated in the past. I will recheck an a1c today. She is encouraged to limit her intake of sugary beverages/foods. - Hemoglobin A1c - BMP8+EGFR  6. Drug therapy - Vitamin B12   Patient was given opportunity to ask questions. Patient verbalized understanding of the plan and was able to repeat key elements of the plan. All questions were answered to their satisfaction.   I, [name], MD, have reviewed all documentation for this visit. The documentation on 10/25/22 for the exam, diagnosis, procedures, and orders are all accurate and complete.   IF YOU HAVE BEEN REFERRED TO A SPECIALIST, IT MAY TAKE 1-2 WEEKS TO SCHEDULE/PROCESS THE REFERRAL. IF YOU HAVE NOT HEARD FROM US/SPECIALIST IN TWO WEEKS, PLEASE GIVE [place] A CALL AT [phone].   THE PATIENT IS ENCOURAGED TO PRACTICE SOCIAL DISTANCING DUE TO THE COVID-19 PANDEMIC.

## 2022-10-14 NOTE — Patient Instructions (Addendum)
Your physician has ordered a Nerve Conduction Study (NCV) and/or EMG testing.  This is a test to assess the status of your nerves and muscles. For the NCV portion of the test , sticky tabs will be placed on either your hands or feet.  Your nerves will be stimulated using small electrical charges and the speed of the impulse will be measured as it travels down the nerve. For the EMG portion of the test, a small pin will be placed below the surface of the skin to measure the electrical activity in certain muscles in your arms or legs. Eating/drinking prior to testing is ok.  Please DO NOT discontinue ANY medications prior to testing  Your appointment will be scheduled by a member of our team.  You will need to check in with the main reception desk. Your test could take approximately 30 minutes to 1 hour to complete depending on the extent of the test that has been ordered. TESTING ON LEGS/BACK/HIP/FOOT AREA: Bring/wear a pair of shorts.  **ABSOLUTELY NO LOTIONS, MOISTURIZERS, VASELINE, OILS OR CREAMS OF ANY KIND ON ANY BODY PART THE DAY OF THE TEST**  **DEODORANT IS OK TO WEAR**  Please make every effort to keep your scheduled appointment time, as your physician needs the test results prior to your next appointment.  If you have to cancel or reschedule, please do so as soon as possible, so that another patient may use that testing time slot.  If you cancel your appointment, you may also need to reschedule your referring doctor's appointment until the test and results can be completed.  Please call [phone] and ask for Dr. Romona Curls assistant if you need to do so.  If you have any questions at any time please let us know.  We look forward to working with you!! Elsevier Patient Education  [place].

## 2022-10-15 LAB — BMP8+EGFR
BUN/Creatinine Ratio: 13 (ref 12–28)
BUN: 11 mg/dL (ref 8–27)
CO2: 26 mmol/L (ref 20–29)
Calcium: 10.3 mg/dL (ref 8.7–10.3)
Chloride: 104 mmol/L (ref 96–106)
Creatinine, Ser: 0.84 mg/dL (ref 0.57–1.00)
Glucose: 98 mg/dL (ref 70–99)
Potassium: 3.4 mmol/L — ABNORMAL LOW (ref 3.5–5.2)
Sodium: 146 mmol/L — ABNORMAL HIGH (ref 134–144)
eGFR: 68 mL/min/{1.73_m2} (ref >59)

## 2022-10-15 LAB — VITAMIN B12: Vitamin B-12: 288 pg/mL (ref 232–1245)

## 2022-10-15 LAB — HEMOGLOBIN A1C
Est. average glucose Bld gHb Est-mCnc: 126 mg/dL
Hgb A1c MFr Bld: 6 % — ABNORMAL HIGH (ref 4.8–5.6)

## 2022-10-16 ENCOUNTER — Ambulatory Visit: Payer: Self-pay

## 2022-10-16 NOTE — Patient Outreach (Signed)
  Care Coordination   Follow Up Visit Note   10/16/2022 Name: FLOREAN HOOBLER MRN: [id number] DOB: May 02, 1936  Anwitha NIYONNA BETSILL is a 87 y.o. year old female who sees Glendale Chard, MD for primary care. I spoke with  Mervin Hack by phone today.  What matters to the patients health and wellness today?  Patient would like to participate in the PREP program.     Goals Addressed               This Visit's Progress     Patient Stated     I am monitoring my blood pressure (pt-stated)        Care Coordination Interventions: Determined patient has a face to face visit scheduled with the PREP nurse this week to help her get started with the exercise program Determined patient completed recent follow up visit with Dr. Baird Cancer Review of patient status, including review of consultant's reports, relevant laboratory and other test results Instructed patient to report new symptoms or concerns to her PCP         SDOH assessments and interventions completed:  No     Care Coordination Interventions:  Yes, provided   Follow up plan: Follow up call scheduled for 01/13/23 '@09'$ :30 AM    Encounter Outcome:  Pt. Visit Completed

## 2022-10-16 NOTE — Patient Instructions (Signed)
Visit Information  Thank you for taking time to visit with me today. Please don't hesitate to contact me if I can be of assistance to you.   Following are the goals we discussed today:   Goals Addressed               This Visit's Progress     Patient Stated     I am monitoring my blood pressure (pt-stated)        Care Coordination Interventions: Determined patient has a face to face visit scheduled with the PREP nurse this week to help her get started with the exercise program Determined patient completed recent follow up visit with Dr. Baird Cancer Review of patient status, including review of consultant's reports, relevant laboratory and other test results Instructed patient to report new symptoms or concerns to her PCP         Our next appointment is by telephone on 01/13/23 at 09:30 AM  Please call the care guide team at [phone] if you need to cancel or reschedule your appointment.   If you are experiencing a Mental Health or [place] or need someone to talk to, please go to [facility] [place] [phone])  The patient verbalized understanding of instructions, educational materials, and care plan provided today and agreed to receive a mailed copy of patient instructions, educational materials, and care plan.   Barb Merino, RN, BSN, CCM Care Management Coordinator [facility] Care Management Direct Phone: [phone]

## 2022-10-25 ENCOUNTER — Encounter: Payer: Self-pay | Admitting: Internal Medicine

## 2022-10-27 NOTE — Progress Notes (Signed)
[facility] PREP Evaluation  Patient Details  Name: Erica Meadows MRN: [id number] Date of Birth: Nov 16, 1935 Age: 87 y.o. PCP: Glendale Chard, MD  Vitals:   10/27/22 1631  BP: 134/66  Pulse: 67  SpO2: 96%  Weight: 125 lb 3.2 oz (56.8 kg)     [facility] Eval - 10/27/22 1600       Referral    Referring Provider Baird Cancer    Reason for referral Hypertension   pre-diabetes   Program Start Date 11/03/22   T/TH 1pm-215pm x 12 wks     Measurement   Waist Circumference 34 inches    Hip Circumference 37 inches    Body fat --   not measured     Information for Trainer   Goals get into a exercise habit    Current Exercise some marching in place at home    Orthopedic Concerns Shoulders    Pertinent Medical History HTN, prediabetes    Current Barriers none    Restrictions/Precautions --   no falls, walks with a forward neck posture   Medications that affect exercise Medication causing dizziness/drowsiness      Timed Up and Go (TUGS)   Timed Up and Go Low risk <9 seconds      Mobility and Daily Activities   I find it easy to walk up or down two or more flights of stairs. 2    I have no trouble taking out the trash. 4    I do housework such as vacuuming and dusting on my own without difficulty. 3    I can easily lift a gallon of milk (8lbs). 4    I can easily walk a mile. 4    I have no trouble reaching into high cupboards or reaching down to pick up something from the floor. 1    I do not have trouble doing out-door work such as Armed forces logistics/support/administrative officer, raking leaves, or gardening. 2      Mobility and Daily Activities   I feel younger than my age. 4    I feel independent. 4    I feel energetic. 4    I live an active life.  3    I feel strong. 2    I feel healthy. 4    I feel active as other people my age. 3      How fit and strong are you.   Fit and Strong Total Score 44            Past Medical History:  Diagnosis Date   High cholesterol    Hyperparathyroidism ([place])     Hypertension    under control; has been on med. x 15-20 yr.   Seasonal allergies    Trigger thumb of left hand 12/2012   Past Surgical History:  Procedure Laterality Date   ABDOMINAL HYSTERECTOMY  1975   partial   HAND  SURGERY  Left 03/2018   ANTERIOR  HAND ;  CUT INTO TENDON ON HAND WHILE CHOPPING CABBAGE ;      hysterectomy partial     PARATHYROIDECTOMY Right 06/16/2018   Procedure: RIGHT INFERIOR PARATHYROIDECTOMY;  Surgeon: [name] Gemma, MD;  Location: [facility];  Service: General;  Laterality: Right;   TRIGGER FINGER RELEASE Right    TRIGGER FINGER RELEASE Left 12/12/2012   Procedure: RELEASE TRIGGER FINGER/A-1 PULLEY LEFT THUMB;  Surgeon: Wynonia Sours, MD;  Location: [facility];  Service: Orthopedics;  Laterality: Left;  ANESTHESIA: IV REGIONAL FAB  Social History   Tobacco Use  Smoking Status Never  Smokeless Tobacco Never    Barnett Hatter 10/27/2022, 4:48 PM

## 2022-11-05 NOTE — Progress Notes (Signed)
[facility] PREP Weekly Session  Patient Details  Name: Erica Meadows MRN: [id number] Date of Birth: [birth year] Age: 87 y.o. PCP: Glendale Chard, MD  There were no vitals filed for this visit.   [facility] Weekly seesion - 11/05/22 1500       [facility] "PREP" Location   [facility] "PREP" Location [facility]      Weekly Session   Topic Discussed Goal setting and welcome to the program    Classes attended to date 2             Barnett Hatter 11/05/2022, 3:58 PM

## 2022-11-16 NOTE — Progress Notes (Signed)
[facility] PREP Weekly Session  Patient Details  Name: Erica Meadows MRN: [id number] Date of Birth: 01/28/1936 Age: 87 y.o. PCP: Glendale Chard, MD  Vitals:   11/10/22 2200  Weight: 127 lb 9.6 oz (57.9 kg)     [facility] Weekly seesion - 11/16/22 0900       [facility] "PREP" Location   [facility] "PREP" Location [facility]      Weekly Session   Topic Discussed Importance of resistance training;Other ways to be active    Minutes exercised this week 40 minutes    Classes attended to date [place] 11/16/2022, 9:44 AM

## 2022-11-19 NOTE — Progress Notes (Signed)
[facility] PREP Weekly Session  Patient Details  Name: Erica Meadows MRN: [id number] Date of Birth: 1936/07/21 Age: 87 y.o. PCP: Glendale Chard, MD  Vitals:   11/17/22 1300  Weight: 126 lb 9.6 oz (57.4 kg)     [facility] Weekly seesion - 11/19/22 1100       [facility] "PREP" Location   [facility] "PREP" Location [facility]      Weekly Session   Topic Discussed Healthy eating tips    Minutes exercised this week 70 minutes    Classes attended to date [place] 11/19/2022, 11:38 AM

## 2022-11-24 NOTE — Progress Notes (Signed)
[facility] PREP Weekly Session  Patient Details  Name: Erica Meadows MRN: [id number] Date of Birth: 1936-06-26 Age: 87 y.o. PCP: Glendale Chard, MD  Vitals:   11/24/22 1300  Weight: 125 lb (56.7 kg)     [facility] Weekly seesion - 11/24/22 1600       [facility] "PREP" Location   [facility] "PREP" Location [facility]      Weekly Session   Topic Discussed Health habits    Minutes exercised this week 45 minutes    Classes attended to date [place] 11/24/2022, 4:10 PM

## 2022-12-08 NOTE — Progress Notes (Signed)
[facility] PREP Weekly Session  Patient Details  Name: Erica Meadows MRN: [id number] Date of Birth: [birth year] Age: 87 y.o. PCP: Glendale Chard, MD  Vitals:   12/08/22 1546  Weight: 124 lb 9.6 oz (56.5 kg)     [facility] Weekly seesion - 12/08/22 1500       [facility] "PREP" Location   [facility] "PREP" Location [facility]      Weekly Session   Topic Discussed Stress management and problem solving   progressive relaxation meditation   Minutes exercised this week 80 minutes    Classes attended to date [place] 12/08/2022, 3:48 PM

## 2022-12-15 NOTE — Progress Notes (Signed)
[facility] PREP Weekly Session  Patient Details  Name: Erica Meadows MRN: [id number] Date of Birth: [birth year] Age: 87 y.o. PCP: Glendale Chard, MD  Vitals:   12/15/22 1300  Weight: 124 lb 6.4 oz (56.4 kg)     [facility] Weekly seesion - 12/15/22 1700       [facility] "PREP" Location   [facility] "PREP" [facility]      Weekly Session   Topic Discussed Expectations and non-scale victories    Minutes exercised this week 140 minutes    Classes attended to date 12             Barnett Hatter 12/15/2022, 5:20 PM

## 2022-12-24 NOTE — Progress Notes (Signed)
[facility] PREP Weekly Session  Patient Details  Name: Erica Meadows MRN: [id number] Date of Birth: Oct 23, 1935 Age: 87 y.o. PCP: Glendale Chard, MD  Vitals:   12/22/22 1300  Weight: 127 lb 8 oz (57.8 kg)     [facility] Weekly seesion - 12/24/22 1600       [facility] "PREP" Location   [facility] "PREP" Location [facility]      Weekly Session   Topic Discussed --   portions   Minutes exercised this week 125 minutes    Classes attended to date [place] 12/24/2022, 4:09 PM

## 2022-12-29 NOTE — Progress Notes (Signed)
[facility] PREP Weekly Session  Patient Details  Name: MARCHEL DRUM MRN: [id number] Date of Birth: 12-24-1935 Age: 87 y.o. PCP: Glendale Chard, MD  Vitals:     [facility] Weekly seesion - 12/29/22 1600       [facility] "PREP" Location   [facility] "PREP" Location [facility]      Weekly Session   Topic Discussed Finding support    Minutes exercised this week 130 minutes    Classes attended to date 16             Barnett Hatter 12/29/2022, 4:31 PM

## 2023-01-07 NOTE — Progress Notes (Signed)
[facility] PREP Weekly Session  Patient Details  Name: Erica Meadows MRN: [id number] Date of Birth: [birth year] Age: 87 y.o. PCP: Glendale Chard, MD  Vitals:     [facility] Weekly seesion - 01/07/23 1700       [facility] "PREP" Location   [facility] "PREP" [facility]      Weekly Session   Topic Discussed Calorie breakdown    Minutes exercised this week 180 minutes    Classes attended to date 19             Barnett Hatter 01/07/2023, 5:39 PM

## 2023-01-11 ENCOUNTER — Encounter: Payer: Self-pay | Admitting: Internal Medicine

## 2023-01-11 ENCOUNTER — Ambulatory Visit (INDEPENDENT_AMBULATORY_CARE_PROVIDER_SITE_OTHER): Payer: Medicare Other | Admitting: Internal Medicine

## 2023-01-11 VITALS — BP 122/74 | HR 68 | Temp 98.2°F | Ht 59.0 in | Wt 122.0 lb

## 2023-01-11 DIAGNOSIS — R7309 Other abnormal glucose: Secondary | ICD-10-CM

## 2023-01-11 DIAGNOSIS — I10 Essential (primary) hypertension: Secondary | ICD-10-CM

## 2023-01-11 DIAGNOSIS — Z6824 Body mass index (BMI) 24.0-24.9, adult: Secondary | ICD-10-CM

## 2023-01-11 DIAGNOSIS — Z79899 Other long term (current) drug therapy: Secondary | ICD-10-CM

## 2023-01-11 MED ORDER — AMLODIPINE BESYLATE 10 MG PO TABS: 10.0000 mg | ORAL_TABLET | Freq: Every day | ORAL | 2 refills | Status: DC

## 2023-01-11 MED ORDER — CARVEDILOL 12.5 MG PO TABS: 12.5000 mg | ORAL_TABLET | Freq: Two times a day (BID) | ORAL | 2 refills | Status: DC

## 2023-01-11 MED ORDER — FUROSEMIDE 20 MG PO TABS: 20.0000 mg | ORAL_TABLET | Freq: Every day | ORAL | 1 refills | Status: DC

## 2023-01-11 MED ORDER — POTASSIUM CHLORIDE CRYS ER 20 MEQ PO TBCR: 20.0000 meq | EXTENDED_RELEASE_TABLET | Freq: Every day | ORAL | 1 refills | Status: DC

## 2023-01-11 NOTE — Progress Notes (Signed)
I,Erica Meadows,acting as a scribe for Erica Alimentobyn N Teola Felipe, MD.,have documented all relevant documentation on the behalf of Erica Alimentobyn N Mccall Lomax, MD,as directed by  Erica Alimentobyn N Tyler Cubit, MD while in the presence of Erica Alimentobyn N Kiing Deakin, MD.    Subjective:     Patient ID: Erica Meadows , female    DOB: July 21, 1936 , [age] y.o.   MRN: [id number]   Chief Complaint  Patient presents with   Weight Check   Hypertension    HPI  Patient presents today for a BP and weight check. She reports compliance with meds. She denies having any headaches, chest pain and shortness of breath. She states she has a good appetite.   Hypertension This is a chronic problem. The current episode started more than 1 year ago. The problem has been gradually improving since onset. The problem is controlled. Pertinent negatives include no blurred vision. Risk factors for coronary artery disease include post-menopausal state and sedentary lifestyle. Past treatments include calcium channel blockers, angiotensin blockers and diuretics. The current treatment provides moderate improvement.     Past Medical History:  Diagnosis Date   High cholesterol    Hyperparathyroidism    Hypertension    under control; has been on med. x 15-20 yr.   Seasonal allergies    Trigger thumb of left hand 12/2012     Family History  Problem Relation Age of Onset   Hypertension Mother    Stroke Mother    Stroke Father    Hypertension Father      Current Outpatient Medications:    acetaminophen (TYLENOL) 500 MG tablet, Take 500 mg by mouth every 6 (six) hours as needed. prn, Disp: , Rfl:    aspirin 81 MG tablet, Take 81 mg by mouth every evening. , Disp: , Rfl:    cholecalciferol (VITAMIN D) 1000 units tablet, Take 5,000 Units by mouth 2 (two) times a week., Disp: , Rfl:    rosuvastatin (CRESTOR) 10 MG tablet, TAKE 1 TABLET BY MOUTH EVERY DAY, Disp: 90 tablet, Rfl: 1   valsartan (DIOVAN) 160 MG tablet, Take 1 tablet (160 mg total) by mouth  daily., Disp: 90 tablet, Rfl: 3   amLODipine (NORVASC) 10 MG tablet, Take 1 tablet (10 mg total) by mouth daily., Disp: 90 tablet, Rfl: 2   carvedilol (COREG) 12.5 MG tablet, Take 1 tablet (12.5 mg total) by mouth 2 (two) times daily with a meal., Disp: 180 tablet, Rfl: 2   furosemide (LASIX) 20 MG tablet, Take 1 tablet (20 mg total) by mouth daily., Disp: 90 tablet, Rfl: 1   potassium chloride SA (KLOR-CON M20) 20 MEQ tablet, Take 1 tablet (20 mEq total) by mouth daily., Disp: 90 tablet, Rfl: 1   No Known Allergies   Review of Systems  Constitutional: Negative.   Eyes:  Negative for blurred vision.  Respiratory: Negative.    Cardiovascular: Negative.   Gastrointestinal: Negative.   Musculoskeletal: Negative.   Skin: Negative.   Neurological: Negative.   Psychiatric/Behavioral: Negative.       Today's Vitals   01/11/23 1407  BP: 122/74  Pulse: 68  Temp: 98.2 F (36.8 C)  SpO2: 98%  Weight: 122 lb (55.3 kg)  Height: 4\' 11"  (1.499 m)   Body mass index is 24.64 kg/m.  Wt Readings from Last 3 Encounters:  01/11/23 122 lb (55.3 kg)  12/22/22 127 lb 8 oz (57.8 kg)  12/15/22 124 lb 6.4 oz (56.4 kg)    BP Readings from Last 3 Encounters:  01/11/23 122/74  10/27/22 134/66  10/14/22 124/80     Objective:  Physical Exam Vitals and nursing note reviewed.  Constitutional:      Appearance: Normal appearance.  HENT:     Head: Normocephalic and atraumatic.     Nose:     Comments: Masked     Mouth/Throat:     Comments: Masked  Eyes:     Extraocular Movements: Extraocular movements intact.  Cardiovascular:     Rate and Rhythm: Normal rate and regular rhythm.     Heart sounds: Normal heart sounds.  Pulmonary:     Effort: Pulmonary effort is normal.  Musculoskeletal:     Cervical back: Normal range of motion.  Skin:    General: Skin is warm.  Neurological:     General: No focal deficit present.     Mental Status: She is alert.  Psychiatric:        Mood and Affect:  Mood normal.        Behavior: Behavior normal.      Assessment And Plan:     1. Essential hypertension Comments: Chronic, well controlled. She will c/w valsartan 160mg , amlodipine 10mg  and carvedilol 12.5mg  twice daily. - CMP14+EGFR - Microalbumin / Creatinine Urine Ratio  2. Other abnormal glucose Comments: Previous labs reviewed, her last a1c has been elevated in the past. I will recheck an a1c today. - Hemoglobin A1c  3. Hypercalcemia - Parathyroid Hormone, Intact w/Ca  4. Body mass index (BMI) of 24.0-24.9 in adult Comments: She was congratulated on her participation in the PREP program. She is encouraged to aim for at least 150 minutes of exercise/week.  5. Drug therapy    Patient was given opportunity to ask questions. Patient verbalized understanding of the plan and was able to repeat key elements of the plan. All questions were answered to their satisfaction.   I, Erica Alimentobyn N Breckyn Troyer, MD, have reviewed all documentation for this visit. The documentation on 01/11/23 for the exam, diagnosis, procedures, and orders are all accurate and complete.   IF YOU HAVE BEEN REFERRED TO A SPECIALIST, IT MAY TAKE 1-2 WEEKS TO SCHEDULE/PROCESS THE REFERRAL. IF YOU HAVE NOT HEARD FROM US/SPECIALIST IN TWO WEEKS, PLEASE GIVE US A CALL AT [phone].   THE PATIENT IS ENCOURAGED TO PRACTICE SOCIAL DISTANCING DUE TO THE COVID-19 PANDEMIC.

## 2023-01-12 ENCOUNTER — Other Ambulatory Visit: Payer: Self-pay | Admitting: Internal Medicine

## 2023-01-12 DIAGNOSIS — E213 Hyperparathyroidism, unspecified: Secondary | ICD-10-CM

## 2023-01-12 LAB — CMP14+EGFR
ALT: 9 IU/L (ref 0–32)
AST: 19 IU/L (ref 0–40)
Albumin/Globulin Ratio: 2.1 (ref 1.2–2.2)
Albumin: 4.6 g/dL (ref 3.7–4.7)
Alkaline Phosphatase: 91 IU/L (ref 44–121)
BUN/Creatinine Ratio: 14 (ref 12–28)
BUN: 10 mg/dL (ref 8–27)
Bilirubin Total: 0.6 mg/dL (ref 0.0–1.2)
CO2: 25 mmol/L (ref 20–29)
Calcium: 10.8 mg/dL — ABNORMAL HIGH (ref 8.7–10.3)
Chloride: 103 mmol/L (ref 96–106)
Creatinine, Ser: 0.73 mg/dL (ref 0.57–1.00)
Globulin, Total: 2.2 g/dL (ref 1.5–4.5)
Glucose: 105 mg/dL — ABNORMAL HIGH (ref 70–99)
Potassium: 3.6 mmol/L (ref 3.5–5.2)
Sodium: 144 mmol/L (ref 134–144)
Total Protein: 6.8 g/dL (ref 6.0–8.5)
eGFR: 81 mL/min/{1.73_m2} (ref >59)

## 2023-01-12 LAB — MICROALBUMIN / CREATININE URINE RATIO
Creatinine, Urine: 49.8 mg/dL
Microalb/Creat Ratio: 33 mg/g creat — ABNORMAL HIGH (ref 0–29)
Microalbumin, Urine: 16.6 ug/mL

## 2023-01-12 LAB — HEMOGLOBIN A1C
Est. average glucose Bld gHb Est-mCnc: 128 mg/dL
Hgb A1c MFr Bld: 6.1 % — ABNORMAL HIGH (ref 4.8–5.6)

## 2023-01-12 LAB — PTH, INTACT AND CALCIUM: PTH: 84 pg/mL — ABNORMAL HIGH (ref 15–65)

## 2023-01-13 ENCOUNTER — Ambulatory Visit: Payer: Self-pay

## 2023-01-13 NOTE — Patient Instructions (Signed)
Visit Information  Thank you for taking time to visit with me today. Please don't hesitate to contact me if I can be of assistance to you.   Following are the goals we discussed today:   Goals Addressed             This Visit's Progress    To have my parathyroid evaluated       Care Coordination Interventions: Evaluation of current treatment plan related to elevated PTH and patient's adherence to plan as established by provider Determined patient completed a recent follow up with PCP Review of patient status, including review of consultant's reports, relevant laboratory and other test results, and medications completed Reviewed and discussed with patient, PCP recommendations for imaging of parathyroid gland and patient verbalizes understanding            Our next appointment is by telephone on 01/15/23 at 10:00 AM  Please call the care guide team at [phone] if you need to cancel or reschedule your appointment.   If you are experiencing a Mental Health or Behavioral Health Crisis or need someone to talk to, please call 1-800-273-TALK (toll free, 24 hour hotline) go to [facility] [address] [place] [phone])  The patient verbalized understanding of instructions, educational materials, and care plan provided today and DECLINED offer to receive copy of patient instructions, educational materials, and care plan.   Delsa Sale, RN, BSN, CCM Care Management Coordinator [facility] Care Management Direct Phone: [phone]

## 2023-01-13 NOTE — Patient Outreach (Signed)
  Care Coordination   Follow Up Visit Note   01/13/2023 Name: Erica Meadows MRN: [id number] DOB: 11-Jan-1936  Erica Meadows is a 87 y.o. year old female who sees Dorothyann Peng, MD for primary care. I spoke with  [name] by phone today.  What matters to the patients health and wellness today?  Patient will undergo further testing of her parathyroid per PCP recommendations.     Goals Addressed             This Visit's Progress    To have my parathyroid evaluated       Care Coordination Interventions: Evaluation of current treatment plan related to elevated PTH and patient's adherence to plan as established by provider Determined patient completed a recent follow up with PCP Review of patient status, including review of consultant's reports, relevant laboratory and other test results, and medications completed Reviewed and discussed with patient, PCP recommendations for imaging of parathyroid gland and patient verbalizes understanding    Interventions Today    Flowsheet Row Most Recent Value  Chronic Disease   Chronic disease during today's visit Other  [elevated PTH]  General Interventions   General Interventions Discussed/Reviewed General Interventions Discussed, General Interventions Reviewed, Labs, Doctor Visits  Doctor Visits Discussed/Reviewed PCP  Education Interventions   Education Provided Provided Education  Provided Verbal Education On Labs          SDOH assessments and interventions completed:  No     Care Coordination Interventions:  Yes, provided   Follow up plan: Follow up call scheduled for 01/15/23 @10 :00 AM    Encounter Outcome:  Pt. Visit Completed

## 2023-01-15 ENCOUNTER — Ambulatory Visit: Payer: Self-pay

## 2023-01-15 NOTE — Patient Outreach (Signed)
  Care Coordination   01/15/2023 Name: Erica Meadows MRN: [id number] DOB: Feb 21, 1936   Care Coordination Outreach Attempts:  An unsuccessful telephone outreach was attempted for a scheduled appointment today.  Follow Up Plan:  Additional outreach attempts will be made to offer the patient care coordination information and services.   Encounter Outcome:  No Answer   Care Coordination Interventions:  No, not indicated    Delsa Sale, RN, BSN, CCM Care Management Coordinator [facility] Care Management Direct Phone: [phone]

## 2023-01-15 NOTE — Patient Instructions (Signed)
Visit Information  Thank you for taking time to visit with me today. Please don't hesitate to contact me if I can be of assistance to you.   Following are the goals we discussed today:   Goals Addressed             This Visit's Progress    To have my parathyroid evaluated       Care Coordination Interventions: Evaluation of current treatment plan related to elevated PTH and patient's adherence to plan as established by provider Reviewed and discussed with patient, PCP recommendations for imaging of parathyroid gland and patient verbalizes understanding  Reviewed referral status with patient, educated patient about next steps and what to expect, she verbalizes understanding            Our next appointment is by telephone on 01/28/23 at 12 PM  Please call the care guide team at [phone] if you need to cancel or reschedule your appointment.   If you are experiencing a Mental Health or Behavioral Health Crisis or need someone to talk to, please call 1-800-273-TALK (toll free, 24 hour hotline) go to [facility] [address] [place] [phone])  The patient verbalized understanding of instructions, educational materials, and care plan provided today and DECLINED offer to receive copy of patient instructions, educational materials, and care plan.   Delsa Sale, RN, BSN, CCM Care Management Coordinator [facility] Care Management Direct Phone: [phone]

## 2023-01-15 NOTE — Patient Outreach (Signed)
  Care Coordination   Follow Up Visit Note   01/15/2023 Name: Erica Meadows MRN: [id number] DOB: March 11, 1936  Erica Meadows is a 87 y.o. year old female who sees Dorothyann Peng, MD for primary care. I spoke with  [name] by phone today.  What matters to the patients health and wellness today?  Patient will complete imaging of her parathyroid gland as directed.     Goals Addressed             This Visit's Progress    To have my parathyroid evaluated       Care Coordination Interventions: Evaluation of current treatment plan related to elevated PTH and patient's adherence to plan as established by provider Reviewed and discussed with patient, PCP recommendations for imaging of parathyroid gland and patient verbalizes understanding  Reviewed referral status with patient, educated patient about next steps and what to expect, she verbalizes understanding     Interventions Today    Flowsheet Row Most Recent Value  Chronic Disease   Chronic disease during today's visit Other  [elevated Calcium]  General Interventions   General Interventions Discussed/Reviewed General Interventions Discussed, General Interventions Reviewed, Labs, Doctor Visits  Doctor Visits Discussed/Reviewed PCP, Doctor Visits Reviewed, Doctor Visits Discussed  Education Interventions   Education Provided Provided Education  Provided Verbal Education On Labs          SDOH assessments and interventions completed:  No     Care Coordination Interventions:  Yes, provided   Follow up plan: Follow up call scheduled for 01/28/23 @12  PM    Encounter Outcome:  Pt. Visit Completed

## 2023-01-19 NOTE — Progress Notes (Signed)
[facility] PREP Weekly Session  Patient Details  Name: ANNALYNNE IBANEZ MRN: [id number] Date of Birth: Jan 13, 1936 Age: 87 y.o. PCP: Dorothyann Peng, MD  Vitals:   01/19/23 1300  Weight: 123 lb 6.4 oz (56 kg)     [facility] Weekly seesion - 01/19/23 1600       [facility] "PREP" Location   [facility] "PREP" Location [facility]      Weekly Session   Topic Discussed --   final surveys and fit testing   Minutes exercised this week 280 minutes    Classes attended to date 22             Bonnye Fava 01/19/2023, 4:43 PM

## 2023-01-26 NOTE — Progress Notes (Signed)
[facility] PREP Evaluation  Patient Details  Name: Erica Meadows MRN: [id number] Date of Birth: 1935-11-04 Age: 87 y.o. PCP: Dorothyann Peng, MD  Vitals:   01/26/23 1130  BP: 130/60  Pulse: 70  SpO2: 96%  Weight: 124 lb 12.8 oz (56.6 kg)     [facility] Eval - 01/26/23 1500       [facility] "PREP" Location   [facility] "PREP" Location [facility]      Referral    Referring Provider Allyne Gee    Program Start Date 11/03/22    Program End Date 01/21/23      Measurement   Waist Circumference 34 inches    Waist Circumference End Program 33 inches    Hip Circumference 37 inches    Hip Circumference End Program 36 inches    Body fat --   not measured     Information for Trainer   Goals Reset      Mobility and Daily Activities   I find it easy to walk up or down two or more flights of stairs. 2    I have no trouble taking out the trash. 4    I do housework such as vacuuming and dusting on my own without difficulty. 3    I can easily lift a gallon of milk (8lbs). 4    I can easily walk a mile. 4    I have no trouble reaching into high cupboards or reaching down to pick up something from the floor. 2    I do not have trouble doing out-door work such as Loss adjuster, chartered, raking leaves, or gardening. 2      Mobility and Daily Activities   I feel younger than my age. 4    I feel independent. 4    I feel energetic. 3    I live an active life.  3    I feel strong. 3    I feel healthy. 4    I feel active as other people my age. 4      How fit and strong are you.   Fit and Strong Total Score 46            Past Medical History:  Diagnosis Date   High cholesterol    Hyperparathyroidism    Hypertension    under control; has been on med. x 15-20 yr.   Seasonal allergies    Trigger thumb of left hand 12/2012   Past Surgical History:  Procedure Laterality Date   ABDOMINAL HYSTERECTOMY  1975   partial   HAND  SURGERY  Left 03/2018   ANTERIOR  HAND ;  CUT INTO TENDON ON HAND WHILE  CHOPPING CABBAGE ;      hysterectomy partial     PARATHYROIDECTOMY Right 06/16/2018   Procedure: RIGHT INFERIOR PARATHYROIDECTOMY;  Surgeon: Darnell Level, MD;  Location: [facility];  Service: General;  Laterality: Right;   TRIGGER FINGER RELEASE Right    TRIGGER FINGER RELEASE Left 12/12/2012   Procedure: RELEASE TRIGGER FINGER/A-1 PULLEY LEFT THUMB;  Surgeon: Nicki Reaper, MD;  Location: [facility];  Service: Orthopedics;  Laterality: Left;  ANESTHESIA: IV REGIONAL FAB   Social History   Tobacco Use  Smoking Status Never  Smokeless Tobacco Never   Attended 22+ workouts and 12 educational sessions Fit testing:  Cardio march: 244 to 284 Sit to stand: 10 to 12 Bicep curl: 14 to 15 Encouraged to keep exercising and working on balance  [employer]  01/26/2023, 3:58 PM

## 2023-01-28 ENCOUNTER — Ambulatory Visit: Payer: Self-pay

## 2023-01-28 NOTE — Patient Outreach (Signed)
  Care Coordination   Follow Up Visit Note   01/28/2023 Name: Erica Meadows MRN: [id number] DOB: [birth year]  Erica Meadows is a 87 y.o. year old female who sees Dorothyann Peng, MD for primary care. I spoke with  [name] by phone today.  What matters to the patients health and wellness today?  Patient will contact her PCP mid to end of next week to check the status of her referral for imaging of her parathyroid.     Goals Addressed             This Visit's Progress    To have my parathyroid evaluated       Care Coordination Interventions: Evaluation of current treatment plan related to elevated PTH and patient's adherence to plan as established by provider Reviewed and discussed with patient, PCP recommendations for imaging of parathyroid gland and patient verbalizes understanding  Determined this referral has yet to be authorized for scheduling Collaborated with PCP Dr. Allyne Gee and referral coordinator, Jerene Bears regarding status of referral for imaging of parathyroid due to hypercalcemia and elevated PTH Determined further clinical documentation was requested by patient's health plan and this has been provided by the referral coordinator Advised patient of referral status and patient will contact the provider mid to end of next week to check the referral status     Interventions Today    Flowsheet Row Most Recent Value  Chronic Disease   Chronic disease during today's visit Other  [Parathyroid]  General Interventions   General Interventions Discussed/Reviewed General Interventions Discussed, General Interventions Reviewed, Doctor Visits, Communication with  Doctor Visits Discussed/Reviewed Doctor Visits Discussed, Doctor Visits Reviewed, PCP  Communication with PCP/Specialists  Education Interventions   Education Provided Provided Education  Provided Verbal Education On Labs          SDOH assessments and interventions completed:  No     Care  Coordination Interventions:  Yes, provided   Follow up plan: Follow up call scheduled for 02/25/23  PM    Encounter Outcome:  Pt. Visit Completed

## 2023-01-28 NOTE — Patient Instructions (Signed)
Visit Information  Thank you for taking time to visit with me today. Please don't hesitate to contact me if I can be of assistance to you.   Following are the goals we discussed today:   Goals Addressed             This Visit's Progress    To have my parathyroid evaluated       Care Coordination Interventions: Evaluation of current treatment plan related to elevated PTH and patient's adherence to plan as established by provider Reviewed and discussed with patient, PCP recommendations for imaging of parathyroid gland and patient verbalizes understanding  Determined this referral has yet to be authorized for scheduling Collaborated with PCP Dr. Allyne Gee and referral coordinator, Jerene Bears regarding status of referral for imaging of parathyroid due to hypercalcemia and elevated PTH Determined further clinical documentation was requested by patient's health plan and this has been provided by the referral coordinator Advised patient of referral status and patient will contact the provider mid to end of next week to check the referral status         Our next appointment is by telephone on 02/25/23 at 12 PM  Please call the care guide team at [phone] if you need to cancel or reschedule your appointment.   If you are experiencing a Mental Health or Behavioral Health Crisis or need someone to talk to, please call 1-800-273-TALK (toll free, 24 hour hotline) go to [facility] [address] [place] [phone])  The patient verbalized understanding of instructions, educational materials, and care plan provided today and DECLINED offer to receive copy of patient instructions, educational materials, and care plan.   Delsa Sale, RN, BSN, CCM Care Management Coordinator [facility] Care Management  Direct Phone: [phone]

## 2023-02-05 ENCOUNTER — Other Ambulatory Visit: Payer: Self-pay | Admitting: Internal Medicine

## 2023-02-25 ENCOUNTER — Ambulatory Visit: Payer: Self-pay

## 2023-02-25 NOTE — Patient Outreach (Signed)
  Care Coordination   Follow Up Visit Note   02/25/2023 Name: Erica Meadows MRN: [id number] DOB: May 10, 1936  Erica Meadows is a 87 y.o. year old female who sees Dorothyann Peng, MD for primary care. I spoke with  Erica Meadows by phone today.  What matters to the patients health and wellness today?  Patient would like to discuss recommendations for parathyroid dysfunction with her PCP at next scheduled visit.     Goals Addressed             This Visit's Progress    To have my parathyroid evaluated       Care Coordination Interventions: Evaluation of current treatment plan related to elevated PTH and patient's adherence to plan as established by provider Determined patient received a denial letter from her health plan for the imaging of her parathryoid gland ordered by PCP Sent in basket message to PCP advising patient's imaging for parathyroid was denied due to patient did not meet health plan criteria for such imaging  Reviewed with patient next scheduled PCP visit with Dr. Allyne Gee set for 03/18/23 @3 :20 PM Instructed patient to take the denial letter in with her during next visit in order to further discuss with Dr. Allyne Gee and patient is agreeable     Interventions Today    Flowsheet Row Most Recent Value  Chronic Disease   Chronic disease during today's visit Other  [parathyroid]  General Interventions   General Interventions Discussed/Reviewed General Interventions Discussed, General Interventions Reviewed, Doctor Visits, Communication with  Doctor Visits Discussed/Reviewed Doctor Visits Reviewed, Doctor Visits Discussed, PCP  Communication with PCP/Specialists  Education Interventions   Education Provided Provided Education  Provided Verbal Education On Other  [imaging for parathyroid]          SDOH assessments and interventions completed:  No     Care Coordination Interventions:  Yes, provided   Follow up plan: Follow up call scheduled for 03/22/23  @12 :15 PM    Encounter Outcome:  Pt. Visit Completed

## 2023-02-25 NOTE — Patient Instructions (Signed)
Visit Information  Thank you for taking time to visit with me today. Please don't hesitate to contact me if I can be of assistance to you.   Following are the goals we discussed today:   Goals Addressed             This Visit's Progress    To have my parathyroid evaluated       Care Coordination Interventions: Evaluation of current treatment plan related to elevated PTH and patient's adherence to plan as established by provider Determined patient received a denial letter from her health plan for the imaging of her parathryoid gland ordered by PCP Sent in basket message to PCP advising patient's imaging for parathyroid was denied due to patient did not meet health plan criteria for such imaging  Reviewed with patient next scheduled PCP visit with Dr. Allyne Gee set for 03/18/23 @3 :20 PM Instructed patient to take the denial letter in with her during next visit in order to further discuss with Dr. Allyne Gee and patient is agreeable         Our next appointment is in person at Dorothyann Peng, MD office on 03/22/23 at 12:15 PM   Please call the care guide team at [phone] if you need to cancel or reschedule your appointment.   If you are experiencing a Mental Health or Behavioral Health Crisis or need someone to talk to, please call 1-800-273-TALK (toll free, 24 hour hotline) go to [facility] [address], [place] [phone])  The patient verbalized understanding of instructions, educational materials, and care plan provided today and DECLINED offer to receive copy of patient instructions, educational materials, and care plan.   Delsa Sale, RN, BSN, CCM Care Management Coordinator [facility] Care Management  Direct Phone: [phone]

## 2023-03-18 ENCOUNTER — Ambulatory Visit (INDEPENDENT_AMBULATORY_CARE_PROVIDER_SITE_OTHER): Payer: Medicare Other | Admitting: Internal Medicine

## 2023-03-18 ENCOUNTER — Encounter: Payer: Self-pay | Admitting: Internal Medicine

## 2023-03-18 VITALS — BP 130/72 | HR 63 | Temp 98.6°F | Ht 59.0 in | Wt 124.2 lb

## 2023-03-18 DIAGNOSIS — Z6825 Body mass index (BMI) 25.0-25.9, adult: Secondary | ICD-10-CM | POA: Diagnosis not present

## 2023-03-18 DIAGNOSIS — H9201 Otalgia, right ear: Secondary | ICD-10-CM

## 2023-03-18 DIAGNOSIS — I10 Essential (primary) hypertension: Secondary | ICD-10-CM

## 2023-03-18 DIAGNOSIS — E213 Hyperparathyroidism, unspecified: Secondary | ICD-10-CM | POA: Diagnosis not present

## 2023-03-18 NOTE — Progress Notes (Signed)
Subjective:  Patient ID: Erica Meadows , female    DOB: 28-Feb-1936 , 87 y.o.   MRN: [id number]  Chief Complaint  Patient presents with   Weight Check   Hypothyroidism    HPI  Patient presents today for weight & thyroid check. She reports compliance with meds. She denies having any headaches, chest pain and shortness of breath. She states she has a good appetite.   Hypertension This is a chronic problem. The current episode started more than 1 year ago. The problem has been gradually improving since onset. The problem is controlled. Pertinent negatives include no blurred vision. Risk factors for coronary artery disease include post-menopausal state and sedentary lifestyle. Past treatments include calcium channel blockers, angiotensin blockers and diuretics. The current treatment provides moderate improvement.     Past Medical History:  Diagnosis Date   High cholesterol    Hyperparathyroidism (HCC)    Hypertension    under control; has been on med. x 15-20 yr.   Seasonal allergies    Trigger thumb of left hand 12/2012     Family History  Problem Relation Age of Onset   Hypertension Mother    Stroke Mother    Stroke Father    Hypertension Father      Current Outpatient Medications:    acetaminophen (TYLENOL) 500 MG tablet, Take 500 mg by mouth every 6 (six) hours as needed. prn, Disp: , Rfl:    amLODipine (NORVASC) 10 MG tablet, Take 1 tablet (10 mg total) by mouth daily., Disp: 90 tablet, Rfl: 2   aspirin 81 MG tablet, Take 81 mg by mouth every evening. , Disp: , Rfl:    carvedilol (COREG) 12.5 MG tablet, Take 1 tablet (12.5 mg total) by mouth 2 (two) times daily with a meal., Disp: 180 tablet, Rfl: 2   cholecalciferol (VITAMIN D) 1000 units tablet, Take 5,000 Units by mouth 2 (two) times a week., Disp: , Rfl:    furosemide (LASIX) 20 MG tablet, Take 1 tablet (20 mg total) by mouth daily., Disp: 90 tablet, Rfl: 1   potassium chloride SA (KLOR-CON M20) 20 MEQ tablet, Take  1 tablet (20 mEq total) by mouth daily., Disp: 90 tablet, Rfl: 1   rosuvastatin (CRESTOR) 10 MG tablet, TAKE 1 TABLET BY MOUTH EVERY DAY, Disp: 90 tablet, Rfl: 1   valsartan (DIOVAN) 160 MG tablet, Take 1 tablet (160 mg total) by mouth daily., Disp: 90 tablet, Rfl: 3   No Known Allergies   Review of Systems  Constitutional: Negative.   HENT:  Positive for ear pain.        She c/o right ear pain. Described as sharp/stabbing. Denies fever/chills/drainage.  Eyes:  Negative for blurred vision.  Respiratory: Negative.    Cardiovascular: Negative.   Gastrointestinal: Negative.   Musculoskeletal: Negative.   Neurological: Negative.   Psychiatric/Behavioral: Negative.       Today's Vitals   03/18/23 1521  BP: 130/72  Pulse: 63  Temp: 98.6 F (37 C)  SpO2: 98%  Weight: 124 lb 3.2 oz (56.3 kg)  Height: 4\' 11"  (1.499 m)   Body mass index is 25.09 kg/m.  Wt Readings from Last 3 Encounters:  03/18/23 124 lb 3.2 oz (56.3 kg)  01/26/23 124 lb 12.8 oz (56.6 kg)  01/19/23 123 lb 6.4 oz (56 kg)    BP Readings from Last 3 Encounters:  03/18/23 130/72  01/26/23 130/60  01/11/23 122/74     The ASCVD Risk score (Arnett DK, et al., 2019)  failed to calculate for the following reasons:   The 2019 ASCVD risk score is only valid for ages 74 to 39  Objective:  Physical Exam Vitals and nursing note reviewed.  Constitutional:      Appearance: Normal appearance.  HENT:     Head: Normocephalic and atraumatic.     Right Ear: Ear canal and external ear normal. There is impacted cerumen.     Left Ear: Tympanic membrane, ear canal and external ear normal. There is no impacted cerumen.  Eyes:     Extraocular Movements: Extraocular movements intact.  Cardiovascular:     Rate and Rhythm: Normal rate and regular rhythm.     Heart sounds: Normal heart sounds.  Pulmonary:     Effort: Pulmonary effort is normal.     Breath sounds: Normal breath sounds.  Musculoskeletal:     Cervical back: Normal  range of motion.  Skin:    General: Skin is warm.  Neurological:     General: No focal deficit present.     Mental Status: She is alert.  Psychiatric:        Mood and Affect: Mood normal.        Behavior: Behavior normal.         Assessment And Plan:  1. Essential hypertension Comments: Chronic, controlled.  She will c/w valsartan 160mg , furosemide 40mg , carvedilol 12.5mg  twice daily and amlodipine 10mg  daily. Follow low sodium diet.  2. Hyperparathyroidism (HCC) Comments: Consider Endocrine evaluation, PTH scan declined. - Parathyroid Hormone, Intact w/Ca  3. Right ear pain Comments: Cerumen impaction noted on exam, she declines irrigation. She is advised to try Debrox eardrops.  4. Hypercalcemia Comments: I will check repeat parathyroid hormone w/ calcium level. - Parathyroid Hormone, Intact w/Ca  5. BMI 25.0-25.9,adult Comments: She has not lost any weight since April. She is encouraged to aim for at least 150 minutes of exercise/week.    Return if symptoms worsen or fail to improve.  Patient was given opportunity to ask questions. Patient verbalized understanding of the plan and was able to repeat key elements of the plan. All questions were answered to their satisfaction.   I, Gwynneth Aliment, MD, have reviewed all documentation for this visit. The documentation on 03/18/23 for the exam, diagnosis, procedures, and orders are all accurate and complete.   IF YOU HAVE BEEN REFERRED TO A SPECIALIST, IT MAY TAKE 1-2 WEEKS TO SCHEDULE/PROCESS THE REFERRAL. IF YOU HAVE NOT HEARD FROM US/SPECIALIST IN TWO WEEKS, PLEASE GIVE [place] A CALL AT [phone] X [phone].

## 2023-03-19 LAB — PTH, INTACT AND CALCIUM
Calcium: 10.5 mg/dL — ABNORMAL HIGH (ref 8.7–10.3)
PTH: 54 pg/mL (ref 15–65)

## 2023-03-22 ENCOUNTER — Ambulatory Visit: Payer: Self-pay

## 2023-03-22 NOTE — Patient Outreach (Signed)
  Care Coordination   03/22/2023 Name: AUDEAN KARPINSKI MRN: [id number] DOB: 24-Jun-1936   Care Coordination Outreach Attempts:  An unsuccessful telephone outreach was attempted for a scheduled appointment today.  Follow Up Plan:  Additional outreach attempts will be made to offer the patient care coordination information and services.   Encounter Outcome:  No Answer   Care Coordination Interventions:  No, not indicated    Delsa Sale, RN, BSN, CCM Care Management Coordinator [facility] Care Management  Direct Phone: [phone]

## 2023-03-30 NOTE — Patient Instructions (Signed)
Hypercalcemia Hypercalcemia is when the level of calcium in a person's blood is above normal. The body needs calcium to make bones and keep them strong. Calcium also helps the muscles, nerves, brain, and heart work the way they should. Most of the calcium in the body is stored in the bones. There is also calcium in the blood. Hypercalcemia occurs when there is too much calcium in your blood. Calcium levels in the blood are regulated by hormones, kidneys, and the gastrointestinal tract.  Hypercalcemia can happen when calcium comes out of the bones, or when the kidneys are not able to remove calcium from the blood. Hypercalcemia can be mild or severe. What are the causes? There are many possible causes of hypercalcemia. Common causes of this condition include: Hyperparathyroidism. This is a condition in which the body produces too much parathyroid hormone. There are four parathyroid glands in your neck. These glands produce a chemical messenger (hormone) that helps the body absorb calcium from foods and helps your bones release calcium. Certain kinds of cancer. Less common causes of hypercalcemia include: Calcium and vitamin D dietary supplements. Chronic kidney disease. Hyperthyroidism. Severe dehydration. Being on bed rest or being inactive for a long time. Certain medicines. Infections. What increases the risk? You are more likely to develop this condition if: You are female. You are 60 years of age or older. You have a family history of hypercalcemia. What are the signs or symptoms? Mild hypercalcemia that starts slowly may not cause symptoms. Severe, sudden hypercalcemia is more likely to cause symptoms, such as: Being more thirsty than usual. Needing to urinate more often than usual. Abdominal pain. Nausea and vomiting. Constipation. Muscle pain, twitching, or weakness. Feeling very tired. How is this diagnosed?  Hypercalcemia is usually diagnosed with a blood test. You may also  have tests to help check what is causing this condition. Tests include imaging tests and more blood tests. How is this treated? Treatment for hypercalcemia depends on the cause. Treatment may include: Receiving fluids through an IV. Medicines. These can be used to: Keep calcium levels steady after receiving fluids (loop diuretics). Keep calcium in your bones (bisphosphonates). Lower the calcium level in your blood. Surgery to remove overactive parathyroid glands. A procedure that filters your blood to correct calcium levels (hemodialysis). Follow these instructions at home:  Take over-the-counter and prescription medicines only as told by your health care provider. Follow instructions from your health care provider about eating or drinking restrictions. Drink enough fluid to keep your urine pale yellow. Stay active. Weight-bearing exercise helps to keep calcium in your bones. Follow instructions from your health care provider about what type and level of exercise is safe for you. Keep all follow-up visits. This is important. Contact a health care provider if: You have a fever. Your heartbeat is irregular or very fast. You have changes in mood, memory, or personality. Get help right away if: You have severe abdominal pain. You have chest pain. You have trouble breathing. You become very confused and sleepy. You lose consciousness. These symptoms may represent a serious problem that is an emergency. Do not wait to see if the symptoms will go away. Get medical help right away. Call your local emergency services (911 in the [place] Do not drive yourself to the hospital. Summary Hypercalcemia is when the level of calcium in a person's blood is above normal. The body needs calcium to make bones and keep them strong. There are many possible causes of hypercalcemia, and treatment depends on   the cause. Take over-the-counter and prescription medicines only as told by your health care  provider. This information is not intended to replace advice given to you by your health care provider. Make sure you discuss any questions you have with your health care provider. Document Revised: 02/26/2021 Document Reviewed: 02/26/2021 Elsevier Patient Education  2024 Elsevier Inc.   

## 2023-04-12 ENCOUNTER — Telehealth: Payer: Self-pay | Admitting: *Deleted

## 2023-04-12 NOTE — Progress Notes (Unsigned)
  Care Coordination Note  04/12/2023 Name: Erica Meadows MRN: [id number] DOB: March 19, 1936  Erica Meadows is a 87 y.o. year old female who is a primary care patient of Dorothyann Peng, MD and is actively engaged with the care management team. I reached out to [name] by phone today to assist with re-scheduling a follow up visit with the RN Case Manager  Follow up plan: Unsuccessful telephone outreach attempt made. A HIPAA compliant phone message was left for the patient providing contact information and requesting a return call.   [facility]  Care Coordination Care Guide  Direct Dial: [phone]

## 2023-04-14 NOTE — Progress Notes (Signed)
  Care Coordination Note  04/14/2023 Name: [name] MRN: [id number] DOB: [birth year]  Erica Meadows is a 87 y.o. year old female who is a primary care patient of Dorothyann Peng, MD and is actively engaged with the care management team. I reached out to [name] by phone today to assist with re-scheduling a follow up visit with the RN Case Manager  Follow up plan: Unsuccessful telephone outreach attempt made. A HIPAA compliant phone message was left for the patient providing contact information and requesting a return call.  [facility]  Care Coordination Care Guide  Direct Dial: [phone]

## 2023-04-28 ENCOUNTER — Telehealth: Payer: Self-pay | Admitting: *Deleted

## 2023-04-28 NOTE — Progress Notes (Signed)
  Care Coordination Note  04/28/2023 Name: Erica Meadows MRN: [id number] DOB: 1935-11-30  Erica Meadows is a 87 y.o. year old female who is a primary care patient of Dorothyann Peng, MD and is actively engaged with the care management team. I reached out to [name] by phone today to assist with re-scheduling a follow up visit with the RN Case Manager  Follow up plan: Unsuccessful telephone outreach attempt made. A HIPAA compliant phone message was left for the patient providing contact information and requesting a return call.   [facility]  Care Coordination Care Guide  Direct Dial: [phone]

## 2023-04-28 NOTE — Progress Notes (Signed)
  Care Coordination Note  04/28/2023 Name: Erica Meadows MRN: [id number] DOB: 11-21-1935  Erica Meadows is a 87 y.o. year old female who is a primary care patient of Dorothyann Peng, MD and is actively engaged with the care management team. I reached out to [name] by phone today to assist with re-scheduling a follow up visit with the RN Case Manager  Follow up plan: Telephone appointment with care management team member scheduled for:05/12/23  [facility] Coordination Care Guide  Direct Dial: [phone]   SIGNATURE

## 2023-05-12 ENCOUNTER — Ambulatory Visit: Payer: Self-pay

## 2023-05-12 NOTE — Patient Outreach (Signed)
Care Coordination   Follow Up Visit Note   05/12/2023 Name: Erica Meadows MRN: [id number] DOB: August 28, 1936  Erica Meadows Erica Meadows is a 87 y.o. year old female who sees Dorothyann Peng, MD for primary care. I spoke with  Erica Meadows by phone today.  What matters to the patients health and wellness today?  Patient would like to maintain her current BMI.     Goals Addressed               This Visit's Progress     Patient Stated     I am monitoring my blood pressure (pt-stated)        Care Coordination Interventions: Evaluation of current treatment plan related to hypertension self management and patient's adherence to plan as established by provider Review of patient status, including review of consultant's reports, relevant laboratory and other test results, and medications completed Counseled on the importance of exercise goals with target of 150 minutes per week Last practice recorded BP readings:  BP Readings from Last 3 Encounters:  03/18/23 130/72  01/26/23 130/60  01/11/23 122/74   Most recent eGFR/CrCl:  Lab Results  Component Value Date   EGFR 81 01/11/2023    No components found for: "CRCL"       Other     COMPLETED: To have my parathyroid evaluated        Care Coordination Interventions: Evaluation of current treatment plan related to elevated PTH and patient's adherence to plan as established by provider Review of patient status, including review of consultant's reports, relevant laboratory and other test results, and medications completed            Component Ref Range & Units 1 mo ago (03/18/23) 4 mo ago (01/11/23) 4 mo ago (01/11/23) 7 mo ago (10/14/22) 10 mo ago (07/16/22) 10 mo ago (07/09/22) 1 yr ago (12/29/21)  Calcium 8.7 - 10.3 mg/dL 24.4 High   01.0 High  10.3 10.7 High  10.4 High  10.4 High        PTH 15 - 65 pg/mL 54    84 High                 To maintain current BMI        Care Coordination Interventions: Evaluation of current  treatment plan related to weight loss  and patient's adherence to plan as established by provider Reviewed and discussed patient is maintaining her weight at 124 lbs since adding protein supplements to her daily caloric intake Educated patient on healthy BMI per CDC recommendations Counseled on the importance of exercise goals with target of 150 minutes per week Body mass index is 25.09 kg/m.     Wt Readings from Last 3 Encounters:  03/18/23 124 lb 3.2 oz (56.3 kg)  01/26/23 124 lb 12.8 oz (56.6 kg)  01/19/23 123 lb 6.4 oz (56 kg)     Interventions Today    Flowsheet Row Most Recent Value  Chronic Disease   Chronic disease during today's visit Hypertension (HTN), Other  [hyperparathyroidism,  BMI]  General Interventions   General Interventions Discussed/Reviewed General Interventions Discussed, General Interventions Reviewed, Doctor Visits, Labs  Doctor Visits Discussed/Reviewed Doctor Visits Reviewed, PCP, Doctor Visits Discussed  Exercise Interventions   Exercise Discussed/Reviewed Weight Managment  Weight Management Weight maintenance  Education Interventions   Education Provided Provided Education  Provided Verbal Education On Labs, Exercise, When to see the doctor, Nutrition  Labs Reviewed --  [PTH, Calcium]  Nutrition Interventions  Nutrition Discussed/Reviewed Nutrition Discussed, Nutrition Reviewed, Supplemental nutrition          SDOH assessments and interventions completed:  No     Care Coordination Interventions:  Yes, provided   Follow up plan: Follow up call scheduled for 07/22/23 @10 :30 AM    Encounter Outcome:  Pt. Visit Completed

## 2023-05-12 NOTE — Patient Instructions (Signed)
Visit Information  Thank you for taking time to visit with me today. Please don't hesitate to contact me if I can be of assistance to you.   Following are the goals we discussed today:   Goals Addressed               This Visit's Progress     Patient Stated     I am monitoring my blood pressure (pt-stated)        Care Coordination Interventions: Evaluation of current treatment plan related to hypertension self management and patient's adherence to plan as established by provider Review of patient status, including review of consultant's reports, relevant laboratory and other test results, and medications completed Counseled on the importance of exercise goals with target of 150 minutes per week Last practice recorded BP readings:  BP Readings from Last 3 Encounters:  03/18/23 130/72  01/26/23 130/60  01/11/23 122/74   Most recent eGFR/CrCl:  Lab Results  Component Value Date   EGFR 81 01/11/2023    No components found for: "CRCL"            Other     COMPLETED: To have my parathyroid evaluated        Care Coordination Interventions: Evaluation of current treatment plan related to elevated PTH and patient's adherence to plan as established by provider Review of patient status, including review of consultant's reports, relevant laboratory and other test results, and medications completed            Component Ref Range & Units 1 mo ago (03/18/23) 4 mo ago (01/11/23) 4 mo ago (01/11/23) 7 mo ago (10/14/22) 10 mo ago (07/16/22) 10 mo ago (07/09/22) 1 yr ago (12/29/21)  Calcium 8.7 - 10.3 mg/dL 40.9 High   81.1 High  10.3 10.7 High  10.4 High  10.4 High        PTH 15 - 65 pg/mL 54    84 High                 To maintain current BMI        Care Coordination Interventions: Evaluation of current treatment plan related to weight loss  and patient's adherence to plan as established by provider Reviewed and discussed patient is maintaining her weight at 124 lbs since  adding protein supplements to her daily caloric intake Educated patient on healthy BMI per CDC recommendations Counseled on the importance of exercise goals with target of 150 minutes per week Body mass index is 25.09 kg/m.     Wt Readings from Last 3 Encounters:  03/18/23 124 lb 3.2 oz (56.3 kg)  01/26/23 124 lb 12.8 oz (56.6 kg)  01/19/23 123 lb 6.4 oz (56 kg)            Our next appointment is by telephone on 07/22/23 at 10:30 AM  Please call the care guide team at [phone] if you need to cancel or reschedule your appointment.   If you are experiencing a Mental Health or Behavioral Health Crisis or need someone to talk to, please call 1-800-273-TALK (toll free, 24 hour hotline)  The patient verbalized understanding of instructions, educational materials, and care plan provided today and DECLINED offer to receive copy of patient instructions, educational materials, and care plan.   Delsa Sale, RN, BSN, CCM Care Management Coordinator [facility] Care Management Direct Phone: [phone]

## 2023-07-13 ENCOUNTER — Other Ambulatory Visit: Payer: Self-pay | Admitting: Internal Medicine

## 2023-07-14 ENCOUNTER — Other Ambulatory Visit: Payer: Self-pay | Admitting: Internal Medicine

## 2023-07-20 ENCOUNTER — Encounter: Payer: Self-pay | Admitting: Internal Medicine

## 2023-07-20 ENCOUNTER — Ambulatory Visit: Payer: Medicare Other | Admitting: Internal Medicine

## 2023-07-20 VITALS — BP 110/70 | HR 66 | Temp 98.1°F | Ht 59.0 in | Wt 122.8 lb

## 2023-07-20 DIAGNOSIS — E213 Hyperparathyroidism, unspecified: Secondary | ICD-10-CM

## 2023-07-20 DIAGNOSIS — Z23 Encounter for immunization: Secondary | ICD-10-CM | POA: Diagnosis not present

## 2023-07-20 DIAGNOSIS — Z0000 Encounter for general adult medical examination without abnormal findings: Secondary | ICD-10-CM

## 2023-07-20 DIAGNOSIS — I10 Essential (primary) hypertension: Secondary | ICD-10-CM | POA: Diagnosis not present

## 2023-07-20 DIAGNOSIS — E2839 Other primary ovarian failure: Secondary | ICD-10-CM

## 2023-07-20 DIAGNOSIS — R7309 Other abnormal glucose: Secondary | ICD-10-CM | POA: Diagnosis not present

## 2023-07-20 LAB — POCT URINALYSIS DIPSTICK
Blood, UA: NEGATIVE
Glucose, UA: NEGATIVE
Ketones, UA: NEGATIVE
Leukocytes, UA: NEGATIVE
Nitrite, UA: NEGATIVE
Protein, UA: NEGATIVE
Spec Grav, UA: 1.02 (ref 1.010–1.025)
Urobilinogen, UA: 1 U/dL
pH, UA: 7 (ref 5.0–8.0)

## 2023-07-20 NOTE — Progress Notes (Signed)
I,Victoria T Deloria Lair, CMA,acting as a Neurosurgeon for Gwynneth Aliment, MD.,have documented all relevant documentation on the behalf of Gwynneth Aliment, MD,as directed by  Gwynneth Aliment, MD while in the presence of Gwynneth Aliment, MD.  Subjective:    Patient ID: Erica Meadows , female    DOB: 12/14/1935 , 87 y.o.   MRN: [id number]  Chief Complaint  Patient presents with   Annual Exam   Hypertension    HPI  Patient presents today for her annual exam. She is no longer followed by GYN. She reports compliance with meds. She denies headaches, chest pain and shortness of breath. She offers no complaints.  Hypertension This is a chronic problem. The current episode started more than 1 year ago. The problem has been gradually improving since onset. The problem is controlled. Risk factors for coronary artery disease include post-menopausal state and sedentary lifestyle. The current treatment provides moderate improvement.     Past Medical History:  Diagnosis Date   High cholesterol    Hyperparathyroidism (HCC)    Hypertension    under control; has been on med. x 15-20 yr.   Seasonal allergies    Trigger thumb of left hand 12/2012     Family History  Problem Relation Age of Onset   Hypertension Mother    Stroke Mother    Stroke Father    Hypertension Father      Current Outpatient Medications:    acetaminophen (TYLENOL) 500 MG tablet, Take 500 mg by mouth every 6 (six) hours as needed. prn, Disp: , Rfl:    amLODipine (NORVASC) 10 MG tablet, Take 1 tablet (10 mg total) by mouth daily., Disp: 90 tablet, Rfl: 2   aspirin 81 MG tablet, Take 81 mg by mouth every evening. , Disp: , Rfl:    carvedilol (COREG) 12.5 MG tablet, Take 1 tablet (12.5 mg total) by mouth 2 (two) times daily with a meal., Disp: 180 tablet, Rfl: 2   cholecalciferol (VITAMIN D) 1000 units tablet, Take 5,000 Units by mouth 2 (two) times a week., Disp: , Rfl:    furosemide (LASIX) 20 MG tablet, Take 1 tablet (20 mg  total) by mouth daily., Disp: 90 tablet, Rfl: 1   KLOR-CON M20 20 MEQ tablet, TAKE 1 TABLET BY MOUTH EVERY DAY, Disp: 90 tablet, Rfl: 1   rosuvastatin (CRESTOR) 10 MG tablet, TAKE 1 TABLET BY MOUTH EVERY DAY, Disp: 90 tablet, Rfl: 1   valsartan (DIOVAN) 160 MG tablet, TAKE 1 TABLET BY MOUTH EVERY DAY, Disp: 90 tablet, Rfl: 3   No Known Allergies    The patient states she uses status post hysterectomy for birth control. No LMP recorded. Patient has had a hysterectomy.. Negative for Dysmenorrhea. Negative for: breast discharge, breast lump(s), breast pain and breast self exam. Associated symptoms include abnormal vaginal bleeding. Pertinent negatives include abnormal bleeding (hematology), anxiety, decreased libido, depression, difficulty falling sleep, dyspareunia, history of infertility, nocturia, sexual dysfunction, sleep disturbances, urinary incontinence, urinary urgency, vaginal discharge and vaginal itching. Diet regular.The patient states her exercise level is    . The patient's tobacco use is:  Social History   Tobacco Use  Smoking Status Never  Smokeless Tobacco Never  . She has been exposed to passive smoke. The patient's alcohol use is:  Social History   Substance and Sexual Activity  Alcohol Use No    Review of Systems  Constitutional: Negative.   HENT: Negative.    Eyes: Negative.   Respiratory: Negative.  Cardiovascular: Negative.   Gastrointestinal: Negative.   Endocrine: Negative.   Genitourinary: Negative.   Musculoskeletal: Negative.   Skin: Negative.   Allergic/Immunologic: Negative.   Neurological: Negative.   Hematological: Negative.   Psychiatric/Behavioral: Negative.       Today's Vitals   07/20/23 1413  BP: 110/70  Pulse: 66  Temp: 98.1 F (36.7 C)  SpO2: 98%  Weight: 122 lb 12.8 oz (55.7 kg)  Height: 4\' 11"  (1.499 m)   Body mass index is 24.8 kg/m.  Wt Readings from Last 3 Encounters:  07/20/23 122 lb 12.8 oz (55.7 kg)  03/18/23 124 lb  3.2 oz (56.3 kg)  01/26/23 124 lb 12.8 oz (56.6 kg)     Objective:  Physical Exam Vitals and nursing note reviewed.  Constitutional:      Appearance: Normal appearance.  HENT:     Head: Normocephalic and atraumatic.     Right Ear: Tympanic membrane, ear canal and external ear normal. There is no impacted cerumen.     Left Ear: Tympanic membrane, ear canal and external ear normal. There is no impacted cerumen.     Nose: Nose normal.     Mouth/Throat:     Mouth: Mucous membranes are moist.     Pharynx: Oropharynx is clear.  Eyes:     Extraocular Movements: Extraocular movements intact.     Conjunctiva/sclera: Conjunctivae normal.     Pupils: Pupils are equal, round, and reactive to light.  Cardiovascular:     Rate and Rhythm: Normal rate and regular rhythm.     Pulses: Normal pulses.     Heart sounds: Normal heart sounds.  Pulmonary:     Effort: Pulmonary effort is normal.     Breath sounds: Normal breath sounds.  Chest:  Breasts:    Tanner Score is 5.     Right: Normal.     Left: Normal.  Abdominal:     General: Abdomen is flat. Bowel sounds are normal.     Palpations: Abdomen is soft.  Genitourinary:    Comments: deferred Musculoskeletal:        General: Normal range of motion.     Cervical back: Normal range of motion and neck supple.  Skin:    General: Skin is warm and dry.  Neurological:     General: No focal deficit present.     Mental Status: She is alert and oriented to person, place, and time.  Psychiatric:        Mood and Affect: Mood normal.        Behavior: Behavior normal.         Assessment And Plan:     Encounter for general adult medical examination w/o abnormal findings Assessment & Plan: A full exam was performed.  Importance of monthly self breast exams was discussed with the patient.  She is advised to get 30-45 minutes of regular exercise, no less than four to five days per week. Both weight-bearing and aerobic exercises are recommended.   She is advised to follow a healthy diet with at least six fruits/veggies per day, decrease intake of red meat and other saturated fats and to increase fish intake to twice weekly.  Meats/fish should not be fried -- baked, boiled or broiled is preferable. It is also important to cut back on your sugar intake.  Be sure to read labels - try to avoid anything with added sugar, high fructose corn syrup or other sweeteners.  If you must use a sweetener, you can try  stevia or monkfruit.  It is also important to avoid artificially sweetened foods/beverages and diet drinks. Lastly, wear SPF 50 sunscreen on exposed skin and when in direct sunlight for an extended period of time.  Be sure to avoid fast food restaurants and aim for at least 60 ounces of water daily.       Essential hypertension Assessment & Plan: Chronic, well controlled. 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 She will continue with amlodipine 10mg  daily, carvedilol 12.5mg  twice daily, furosemide 20mg  daily and valsartan 160mg  daily. Encouraged to follow low sodium diet. She will rto in 4-6 months for re-evaluation.      Orders: -     POCT urinalysis dipstick -     Microalbumin / creatinine urine ratio -     EKG 12-Lead -     CBC -     CMP14+EGFR -     Lipid panel -     TSH + free T4  Other abnormal glucose Assessment & Plan: Previous labs reviewed, her A1c has been elevated in the past. I will check an A1c today. Reminded to avoid refined sugars including sugary drinks/foods and processed meats including bacon, sausages and deli meats.    Orders: -     Hemoglobin A1c  Estrogen deficiency Assessment & Plan: I will refer her to [facility] for bone density. She is encouraged to engage in weight-bearing exercises at least three days per week.    Hypercalcemia Assessment & Plan: Chronic, most recent PTH no longer elevated. I will check labs as below.   Orders: -     Protein electrophoresis,  serum  Immunization due -     Flu Vaccine Trivalent High Dose (Fluad)     Return for 1 YEAR HM, resch dec apt for feb bpc. Patient was given opportunity to ask questions. Patient verbalized understanding of the plan and was able to repeat key elements of the plan. All questions were answered to their satisfaction.   I, Gwynneth Aliment, MD, have reviewed all documentation for this visit. The documentation on 07/20/23 for the exam, diagnosis, procedures, and orders are all accurate and complete.

## 2023-07-20 NOTE — Patient Instructions (Signed)

## 2023-07-22 ENCOUNTER — Ambulatory Visit: Payer: Self-pay

## 2023-07-22 NOTE — Patient Outreach (Signed)
  Care Coordination   07/22/2023 Name: Erica Meadows MRN: [id number] DOB: 08-31-1936   Care Coordination Outreach Attempts:  An unsuccessful telephone outreach was attempted for a scheduled appointment today.  Follow Up Plan:  Additional outreach attempts will be made to offer the patient care coordination information and services.   Encounter Outcome:  No Answer   Care Coordination Interventions:  No, not indicated    Delsa Sale RN BSN CCM [place]  [facility], [facility] Nurse Care Coordinator  Direct Dial: [phone] Website: [email or web address]

## 2023-07-23 LAB — CMP14+EGFR
ALT: 12 [IU]/L (ref 0–32)
AST: 23 [IU]/L (ref 0–40)
Albumin: 4.6 g/dL (ref 3.7–4.7)
Alkaline Phosphatase: 90 [IU]/L (ref 44–121)
BUN/Creatinine Ratio: 16 (ref 12–28)
BUN: 11 mg/dL (ref 8–27)
Bilirubin Total: 0.5 mg/dL (ref 0.0–1.2)
CO2: 26 mmol/L (ref 20–29)
Calcium: 10.5 mg/dL — ABNORMAL HIGH (ref 8.7–10.3)
Chloride: 104 mmol/L (ref 96–106)
Creatinine, Ser: 0.67 mg/dL (ref 0.57–1.00)
Globulin, Total: 2.1 g/dL (ref 1.5–4.5)
Glucose: 95 mg/dL (ref 70–99)
Potassium: 3.5 mmol/L (ref 3.5–5.2)
Sodium: 145 mmol/L — ABNORMAL HIGH (ref 134–144)
Total Protein: 6.7 g/dL (ref 6.0–8.5)
eGFR: 86 mL/min/{1.73_m2} (ref >59)

## 2023-07-23 LAB — PROTEIN ELECTROPHORESIS, SERUM
A/G Ratio: 1.6 (ref 0.7–1.7)
Albumin ELP: 4.1 g/dL (ref 2.9–4.4)
Alpha 1: 0.3 g/dL (ref 0.0–0.4)
Alpha 2: 0.7 g/dL (ref 0.4–1.0)
Beta: 1 g/dL (ref 0.7–1.3)
Gamma Globulin: 0.8 g/dL (ref 0.4–1.8)
Globulin, Total: 2.6 g/dL (ref 2.2–3.9)

## 2023-07-23 LAB — CBC
Hematocrit: 38.8 % (ref 34.0–46.6)
Hemoglobin: 12.6 g/dL (ref 11.1–15.9)
MCH: 30.5 pg (ref 26.6–33.0)
MCHC: 32.5 g/dL (ref 31.5–35.7)
MCV: 94 fL (ref 79–97)
Platelets: 217 10*3/uL (ref 150–450)
RBC: 4.13 x10E6/uL (ref 3.77–5.28)
RDW: 12.5 % (ref 11.7–15.4)
WBC: 4.4 10*3/uL (ref 3.4–10.8)

## 2023-07-23 LAB — HEMOGLOBIN A1C
Est. average glucose Bld gHb Est-mCnc: 126 mg/dL
Hgb A1c MFr Bld: 6 % — ABNORMAL HIGH (ref 4.8–5.6)

## 2023-07-23 LAB — MICROALBUMIN / CREATININE URINE RATIO
Creatinine, Urine: 72.7 mg/dL
Microalb/Creat Ratio: 28 mg/g{creat} (ref 0–29)
Microalbumin, Urine: 20.7 ug/mL

## 2023-07-23 LAB — TSH+FREE T4
Free T4: 1.28 ng/dL (ref 0.82–1.77)
TSH: 0.911 u[IU]/mL (ref 0.450–4.500)

## 2023-07-23 LAB — LIPID PANEL
Chol/HDL Ratio: 2 {ratio} (ref 0.0–4.4)
Cholesterol, Total: 172 mg/dL (ref 100–199)
HDL: 85 mg/dL (ref >39)
LDL Chol Calc (NIH): 73 mg/dL (ref 0–99)
Triglycerides: 73 mg/dL (ref 0–149)
VLDL Cholesterol Cal: 14 mg/dL (ref 5–40)

## 2023-08-01 DIAGNOSIS — E2839 Other primary ovarian failure: Secondary | ICD-10-CM | POA: Insufficient documentation

## 2023-08-01 DIAGNOSIS — Z0000 Encounter for general adult medical examination without abnormal findings: Secondary | ICD-10-CM | POA: Insufficient documentation

## 2023-08-01 NOTE — Assessment & Plan Note (Signed)
Chronic, most recent PTH no longer elevated. I will check labs as below.

## 2023-08-01 NOTE — Assessment & Plan Note (Addendum)
Chronic, well controlled. 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 She will continue with amlodipine 10mg  daily, carvedilol 12.5mg  twice daily, furosemide 20mg  daily and valsartan 160mg  daily. Encouraged to follow low sodium diet. She will rto in 4-6 months for re-evaluation.

## 2023-08-01 NOTE — Assessment & Plan Note (Signed)
I will refer her to [facility] for bone density. She is encouraged to engage in weight-bearing exercises at least three days per week.

## 2023-08-01 NOTE — Assessment & Plan Note (Signed)

## 2023-08-01 NOTE — Assessment & Plan Note (Signed)
Previous labs reviewed, her A1c has been elevated in the past. I will check an A1c today. Reminded to avoid refined sugars including sugary drinks/foods and processed meats including bacon, sausages and deli meats.  

## 2023-08-04 ENCOUNTER — Other Ambulatory Visit: Payer: Self-pay | Admitting: Internal Medicine

## 2023-08-16 ENCOUNTER — Ambulatory Visit: Payer: Self-pay

## 2023-08-16 NOTE — Patient Instructions (Signed)
Visit Information  Thank you for taking time to visit with me today. Please don't hesitate to contact me if I can be of assistance to you.   Following are the goals we discussed today:   Goals Addressed               This Visit's Progress     Patient Stated     COMPLETED: I am monitoring my blood pressure (pt-stated)        Care Coordination Interventions: Evaluation of current treatment plan related to hypertension self management and patient's adherence to plan as established by provider Review of patient status, including review of consultant's reports, relevant laboratory and other test results, and medications completed Last practice recorded BP readings:  BP Readings from Last 3 Encounters:  07/20/23 110/70  03/18/23 130/72  01/26/23 130/60   Most recent eGFR/CrCl:  Lab Results  Component Value Date   EGFR 86 07/20/2023    No components found for: "CRCL"      Other     To increase daily water intake as directed        Care Coordination Interventions: Review of patient status, including review of consultant's reports, relevant laboratory and other test results     Educated patient regarding the importance to increase daily water intake, instructed to aim for 48-64 oz daily unless otherwise directed by your doctor  CMP     Component Value Date/Time   NA 145 (H) 07/20/2023 1518   K 3.5 07/20/2023 1518   CL 104 07/20/2023 1518   CO2 26 07/20/2023 1518   GLUCOSE 95 07/20/2023 1518   GLUCOSE 138 (H) 06/13/2018 1102   BUN 11 07/20/2023 1518   CREATININE 0.67 07/20/2023 1518   CALCIUM 10.5 (H) 07/20/2023 1518   PROT 6.7 07/20/2023 1518   ALBUMIN 4.6 07/20/2023 1518   AST 23 07/20/2023 1518   ALT 12 07/20/2023 1518   ALKPHOS 90 07/20/2023 1518   BILITOT 0.5 07/20/2023 1518   EGFR 86 07/20/2023 1518   GFRNONAA 73 06/19/2020 1752         To maintain and or lower A1c        Care Coordination Interventions: Provided education to patient about basic prediabetes  disease process Review of patient status, including review of consultants reports, relevant laboratory and other test results, and medications completed Positive reinforcement given to patient for making efforts to lower her A1c Lab Results  Component Value Date   HGBA1C 6.0 (H) 07/20/2023          Our next appointment is by telephone on 11/22/23 at 12:30 PM  Please call the care guide team at [phone] if you need to cancel or reschedule your appointment.   If you are experiencing a Mental Health or Behavioral Health Crisis or need someone to talk to, please call 1-800-273-TALK (toll free, 24 hour hotline)  Patient verbalizes understanding of instructions and care plan provided today and agrees to view in MyChart. Active MyChart status and patient understanding of how to access instructions and care plan via MyChart confirmed with patient.     Delsa Sale RN BSN CCM [place]  [facility], [facility] Nurse Care Coordinator  Direct Dial: [phone] Website: [email or web address]

## 2023-08-16 NOTE — Patient Outreach (Signed)
Care Coordination   Follow Up Visit Note   08/16/2023 Name: Erica Meadows MRN: [id number] DOB: 1936/09/24  Erica Meadows is a 87 y.o. year old female who sees Dorothyann Peng, MD for primary care. I spoke with  [name] by phone today.  What matters to the patients health and wellness today?  Patient would like to increase  her daily water intake and continue to lower her A1c.     Goals Addressed               This Visit's Progress     Patient Stated     COMPLETED: I am monitoring my blood pressure (pt-stated)        Care Coordination Interventions: Evaluation of current treatment plan related to hypertension self management and patient's adherence to plan as established by provider Review of patient status, including review of consultant's reports, relevant laboratory and other test results, and medications completed Last practice recorded BP readings:  BP Readings from Last 3 Encounters:  07/20/23 110/70  03/18/23 130/72  01/26/23 130/60   Most recent eGFR/CrCl:  Lab Results  Component Value Date   EGFR 86 07/20/2023    No components found for: "CRCL"       Other     To increase daily water intake as directed        Care Coordination Interventions: Review of patient status, including review of consultant's reports, relevant laboratory and other test results     Educated patient regarding the importance to increase daily water intake, instructed to aim for 48-64 oz daily unless otherwise directed by your doctor  CMP     Component Value Date/Time   NA 145 (H) 07/20/2023 1518   K 3.5 07/20/2023 1518   CL 104 07/20/2023 1518   CO2 26 07/20/2023 1518   GLUCOSE 95 07/20/2023 1518   GLUCOSE 138 (H) 06/13/2018 1102   BUN 11 07/20/2023 1518   CREATININE 0.67 07/20/2023 1518   CALCIUM 10.5 (H) 07/20/2023 1518   PROT 6.7 07/20/2023 1518   ALBUMIN 4.6 07/20/2023 1518   AST 23 07/20/2023 1518   ALT 12 07/20/2023 1518   ALKPHOS 90 07/20/2023 1518    BILITOT 0.5 07/20/2023 1518   EGFR 86 07/20/2023 1518   GFRNONAA 73 06/19/2020 1752         To maintain and or lower A1c        Care Coordination Interventions: Provided education to patient about basic prediabetes disease process Review of patient status, including review of consultants reports, relevant laboratory and other test results, and medications completed Positive reinforcement given to patient for making efforts to lower her A1c Lab Results  Component Value Date   HGBA1C 6.0 (H) 07/20/2023      Interventions Today    Flowsheet Row Most Recent Value  Chronic Disease   Chronic disease during today's visit Diabetes, Chronic Kidney Disease/End Stage Renal Disease (ESRD), Other, Hypertension (HTN)  [Hyperlipidemia]  General Interventions   General Interventions Discussed/Reviewed General Interventions Discussed, General Interventions Reviewed, Labs, Doctor Visits  Doctor Visits Discussed/Reviewed Doctor Visits Discussed, Doctor Visits Reviewed, PCP  Education Interventions   Education Provided Provided Education  Provided Verbal Education On Nutrition, When to see the doctor, Labs  Labs Reviewed Kidney Function, Hgb A1c, Lipid Profile  [Calcium, urine]  Nutrition Interventions   Nutrition Discussed/Reviewed Nutrition Discussed, Nutrition Reviewed, Fluid intake  Pharmacy Interventions   Pharmacy Dicussed/Reviewed Pharmacy Topics Reviewed  SDOH assessments and interventions completed:  No     Care Coordination Interventions:  Yes, provided   Follow up plan: Follow up call scheduled for 11/22/23 @12 :30 PM    Encounter Outcome:  Patient Visit Completed

## 2023-08-23 ENCOUNTER — Other Ambulatory Visit: Payer: Self-pay | Admitting: Internal Medicine

## 2023-08-23 ENCOUNTER — Other Ambulatory Visit: Payer: Medicare Other

## 2023-08-23 DIAGNOSIS — R822 Biliuria: Secondary | ICD-10-CM | POA: Diagnosis not present

## 2023-08-24 ENCOUNTER — Ambulatory Visit (INDEPENDENT_AMBULATORY_CARE_PROVIDER_SITE_OTHER): Payer: Medicare Other

## 2023-08-24 VITALS — BP 110/74 | HR 75 | Temp 98.1°F | Ht 59.0 in | Wt 122.0 lb

## 2023-08-24 DIAGNOSIS — Z23 Encounter for immunization: Secondary | ICD-10-CM

## 2023-08-24 LAB — BILIRUBIN, FRACTIONATED(TOT/DIR/INDIR)
Bilirubin Total: 0.5 mg/dL (ref 0.0–1.2)
Bilirubin, Direct: 0.19 mg/dL (ref 0.00–0.40)
Bilirubin, Indirect: 0.31 mg/dL (ref 0.10–0.80)

## 2023-08-24 LAB — GAMMA GT: GGT: 15 [IU]/L (ref 0–60)

## 2023-08-24 LAB — MITOCHONDRIAL/SMOOTH MUSCLE AB PNL
Mitochondrial Ab: <20 U (ref 0.0–20.0)
Smooth Muscle Ab: 6 U (ref 0–19)

## 2023-08-24 NOTE — Progress Notes (Signed)
Presented today for COVID vaccine. She was given [employer] COVID booster in L/R arm. She waited for 15 minutes, he/she did not experience any ill effects.

## 2023-09-14 DIAGNOSIS — Z1231 Encounter for screening mammogram for malignant neoplasm of breast: Secondary | ICD-10-CM | POA: Diagnosis not present

## 2023-09-14 DIAGNOSIS — M8588 Other specified disorders of bone density and structure, other site: Secondary | ICD-10-CM | POA: Diagnosis not present

## 2023-09-14 LAB — HM MAMMOGRAPHY

## 2023-09-14 LAB — HM DEXA SCAN

## 2023-09-15 ENCOUNTER — Ambulatory Visit: Payer: Medicare Other | Admitting: *Deleted

## 2023-09-15 ENCOUNTER — Ambulatory Visit: Payer: Self-pay | Admitting: Internal Medicine

## 2023-09-15 VITALS — Ht 59.0 in | Wt 123.0 lb

## 2023-09-15 DIAGNOSIS — Z0000 Encounter for general adult medical examination without abnormal findings: Secondary | ICD-10-CM

## 2023-09-15 NOTE — Patient Instructions (Signed)
Ms. Butala , Thank you for taking time to come for your Medicare Wellness Visit. I appreciate your ongoing commitment to your health goals. Please review the following plan we discussed and let me know if I can assist you in the future.   Referrals/Orders/Follow-Ups/Clinician Recommendations: None  This is a list of the screening recommended for you and due dates:  Health Maintenance  Topic Date Due   Mammogram  09/09/2023   COVID-19 Vaccine (7 - 2023-24 season) 10/19/2023   Medicare Annual Wellness Visit  09/14/2024   DTaP/Tdap/Td vaccine (2 - Td or Tdap) 03/03/2028   Pneumonia Vaccine  Completed   Flu Shot  Completed   DEXA scan (bone density measurement)  Completed   Zoster (Shingles) Vaccine  Completed   HPV Vaccine  Aged Out    Advanced directives: (Copy Requested) Please bring a copy of your health care power of attorney and living will to the office to be added to your chart at your convenience.  Next Medicare Annual Wellness Visit scheduled for next year: Yes 09/20/24 @ 12:40

## 2023-09-15 NOTE — Progress Notes (Signed)
#[id number]  Subjective:   Erica Meadows is a 87 y.o. female who presents for Medicare Annual (Subsequent) preventive examination.  Visit Complete: Virtual I connected with  [name] on 09/15/23 by a audio enabled telemedicine application and verified that I am speaking with the correct person using two identifiers.  Patient Location: Home  Provider Location: Home Office  I discussed the limitations of evaluation and management by telemedicine. The patient expressed understanding and agreed to proceed.  Vital Signs: Because this visit was a virtual/telehealth visit, some criteria may be missing or patient reported. Any vitals not documented were not able to be obtained and vitals that have been documented are patient reported.   Cardiac Risk Factors include: advanced age (>18men, >31 women);hypertension;dyslipidemia     Objective:    Today's Vitals   09/15/23 1257  Weight: 123 lb (55.8 kg)  Height: 4\' 11"  (1.499 m)   Body mass index is 24.84 kg/m.     09/15/2023    1:09 PM 08/19/2022    9:08 AM 07/03/2021    2:13 PM 05/08/2021    9:38 AM 06/19/2020    4:06 PM 06/14/2019   11:24 AM 08/11/2018   10:33 AM  Advanced Directives  Does Patient Have a Medical Advance Directive? Yes Yes No No No No No  Type of Estate agent of [place];Living will Healthcare Power of Attorney       Copy of Healthcare Power of Attorney in Chart? No - copy requested No - copy requested       Would patient like information on creating a medical advance directive?   No - Patient declined Yes (MAU/Ambulatory/Procedural Areas - Information given) No - Patient declined  No - Patient declined    Current Medications (verified) Outpatient Encounter Medications as of 09/15/2023  Medication Sig   acetaminophen (TYLENOL) 500 MG tablet Take 500 mg by mouth every 6 (six) hours as needed. prn   amLODipine (NORVASC) 10 MG tablet Take 1 tablet (10 mg total) by mouth daily.    aspirin 81 MG tablet Take 81 mg by mouth every evening.    carvedilol (COREG) 12.5 MG tablet Take 1 tablet (12.5 mg total) by mouth 2 (two) times daily with a meal.   cholecalciferol (VITAMIN D) 1000 units tablet Take 5,000 Units by mouth 2 (two) times a week.   furosemide (LASIX) 20 MG tablet Take 1 tablet (20 mg total) by mouth daily.   KLOR-CON M20 20 MEQ tablet TAKE 1 TABLET BY MOUTH EVERY DAY   rosuvastatin (CRESTOR) 10 MG tablet TAKE 1 TABLET BY MOUTH EVERY DAY   valsartan (DIOVAN) 160 MG tablet TAKE 1 TABLET BY MOUTH EVERY DAY   No facility-administered encounter medications on file as of 09/15/2023.    Allergies (verified) Patient has no known allergies.   History: Past Medical History:  Diagnosis Date   High cholesterol    Hyperparathyroidism (HCC)    Hypertension    under control; has been on med. x 15-20 yr.   Seasonal allergies    Trigger thumb of left hand 12/2012   Past Surgical History:  Procedure Laterality Date   ABDOMINAL HYSTERECTOMY  1975   partial   HAND  SURGERY  Left 03/2018   ANTERIOR  HAND ;  CUT INTO TENDON ON HAND WHILE CHOPPING CABBAGE ;      hysterectomy partial     PARATHYROIDECTOMY Right 06/16/2018   Procedure: RIGHT INFERIOR PARATHYROIDECTOMY;  Surgeon: Darnell Level, MD;  Location: [facility];  Service: General;  Laterality: Right;   TRIGGER FINGER RELEASE Right    TRIGGER FINGER RELEASE Left 12/12/2012   Procedure: RELEASE TRIGGER FINGER/A-1 PULLEY LEFT THUMB;  Surgeon: Nicki Reaper, MD;  Location: [facility];  Service: Orthopedics;  Laterality: Left;  ANESTHESIA: IV REGIONAL FAB   Family History  Problem Relation Age of Onset   Hypertension Mother    Stroke Mother    Stroke Father    Hypertension Father    Social History   Socioeconomic History   Marital status: Married    Spouse name: Not on file   Number of children: Not on file   Years of education: Not on file   Highest education level: Not on file  Occupational History    Occupation: retired  Tobacco Use   Smoking status: Never   Smokeless tobacco: Never  Vaping Use   Vaping status: Never Used  Substance and Sexual Activity   Alcohol use: No   Drug use: No   Sexual activity: Not Currently  Other Topics Concern   Not on file  Social History Narrative   married   Social Determinants of Health   Financial Resource Strain: Low Risk  (09/15/2023)   Overall Financial Resource Strain (CARDIA)    Difficulty of Paying Living Expenses: Not hard at all  Food Insecurity: No Food Insecurity (09/15/2023)   Hunger Vital Sign    Worried About Running Out of Food in the Last Year: Never true    Ran Out of Food in the Last Year: Never true  Transportation Needs: No Transportation Needs (09/15/2023)   PRAPARE - Administrator, Civil Service (Medical): No    Lack of Transportation (Non-Medical): No  Physical Activity: Inactive (09/15/2023)   Exercise Vital Sign    Days of Exercise per Week: 0 days    Minutes of Exercise per Session: 0 min  Stress: No Stress Concern Present (09/15/2023)   [employer] of Occupational Health - Occupational Stress Questionnaire    Feeling of Stress : Not at all  Social Connections: Socially Integrated (09/15/2023)   Social Connection and Isolation Panel [NHANES]    Frequency of Communication with Friends and Family: More than three times a week    Frequency of Social Gatherings with Friends and Family: More than three times a week    Attends Religious Services: More than 4 times per year    Active Member of [employer] or Organizations: Yes    Attends Engineer, structural: More than 4 times per year    Marital Status: Married    Tobacco Counseling Counseling given: Not Answered   Clinical Intake:  Pre-visit preparation completed: Yes  Pain : No/denies pain     BMI - recorded: 24.84 Nutritional Status: BMI of 19-24  Normal Nutritional Risks: None Diabetes: No  How often do you need to have  someone help you when you read instructions, pamphlets, or other written materials from your doctor or pharmacy?: 1 - Never  Interpreter Needed?: No  Information entered by :: R. Dominque Levandowski LPN   Activities of Daily Living    09/15/2023   12:59 PM  In your present state of health, do you have any difficulty performing the following activities:  Hearing? 0  Vision? 0  Comment glasses  Difficulty concentrating or making decisions? 0  Walking or climbing stairs? 0  Dressing or bathing? 0  Doing errands, shopping? 0  Preparing Food and eating ? N  Using the Toilet? N  In the past six months, have you accidently leaked urine? Y  Comment wears pads  Do you have problems with loss of bowel control? N  Managing your Medications? N  Managing your Finances? N  Housekeeping or managing your Housekeeping? N    Patient Care Team: Dorothyann Peng, MD as PCP - General (Internal Medicine) Clarene Duke Karma Lew, RN as [facility] Care Management Janalyn Harder, MD (Inactive) as Consulting Physician (Dermatology)  Indicate any recent Medical Services you may have received from other than [facility] providers in the past year (date may be approximate).     Assessment:   This is a routine wellness examination for Lyn.  Hearing/Vision screen Hearing Screening - Comments:: No issues Vision Screening - Comments:: glasses   Goals Addressed             This Visit's Progress    Patient Stated       Start an exercise program       Depression Screen    09/15/2023    1:03 PM 01/11/2023    2:06 PM 08/19/2022    9:09 AM 07/09/2022    2:20 PM 07/03/2021    2:14 PM 06/26/2021    2:45 PM 06/19/2020    4:08 PM  PHQ 2/9 Scores  PHQ - 2 Score 0 0 0 0 0 0 0  PHQ- 9 Score 0 0         Fall Risk    09/15/2023    1:01 PM 01/11/2023    2:06 PM 08/19/2022    9:09 AM 07/09/2022    2:20 PM 07/03/2021    2:14 PM  Fall Risk   Falls in the past year? 0 0 0 0 0  Number falls in past yr: 0 0 0 0    Injury with Fall? 0 0 0 0   Risk for fall due to : No Fall Risks No Fall Risks Medication side effect No Fall Risks Medication side effect  Follow up Falls prevention discussed;Falls evaluation completed Falls evaluation completed Falls prevention discussed;Education provided;Falls evaluation completed Falls evaluation completed Falls evaluation completed;Education provided;Falls prevention discussed    MEDICARE RISK AT HOME: Medicare Risk at Home Any stairs in or around the home?: No If so, are there any without handrails?: No Home free of loose throw rugs in walkways, pet beds, electrical cords, etc?: Yes Adequate lighting in your home to reduce risk of falls?: Yes Life alert?: No Use of a cane, walker or w/c?: No Grab bars in the bathroom?: Yes Shower chair or bench in shower?: No Elevated toilet seat or a handicapped toilet?: Yes      Cognitive Function:        09/15/2023    1:09 PM 08/19/2022    9:11 AM 07/03/2021    2:16 PM 06/19/2020    4:09 PM 06/14/2019   11:27 AM  6CIT Screen  What Year? 0 points 0 points 0 points 0 points 0 points  What month? 0 points 0 points 0 points 0 points 0 points  What time? 0 points 0 points 0 points 0 points 0 points  Count back from 20 0 points 0 points 0 points 0 points 0 points  Months in reverse 0 points 0 points 0 points 0 points 0 points  Repeat phrase 0 points 0 points 0 points 0 points 0 points  Total Score 0 points 0 points 0 points 0 points 0 points    Immunizations Immunization History  Administered Date(s) Administered  Fluad Quad(high Dose 65+) 07/19/2019, 06/19/2020, 07/03/2021, 07/09/2022   Fluad Trivalent(High Dose 65+) 07/20/2023   Influenza, High Dose Seasonal PF 12/15/2018   Influenza-Unspecified 12/15/2018, 07/19/2019   Moderna Covid-19 Vaccine Bivalent Booster 2yrs & up 09/23/2022   PFIZER(Purple Top)SARS-COV-2 Vaccination 12/07/2019, 01/03/2020, 09/16/2020, 06/20/2021   PNEUMOCOCCAL CONJUGATE-20 12/30/2021    Pfizer Covid-19 Vaccine Bivalent Booster 75yrs & up 06/20/2021   Pfizer(Comirnaty)Fall Seasonal Vaccine 12 years and older 08/24/2023   Tdap 03/03/2018   Zoster Recombinant(Shingrix) 05/06/2021, 11/01/2021    TDAP status: Up to date  Flu Vaccine status: Up to date  Pneumococcal vaccine status: Up to date  Covid-19 vaccine status: Completed vaccines  Qualifies for Shingles Vaccine? Yes   Zostavax completed No   Shingrix Completed?: Yes  Screening Tests Health Maintenance  Topic Date Due   Medicare Annual Wellness (AWV)  08/20/2023   MAMMOGRAM  09/09/2023   COVID-19 Vaccine (7 - 2023-24 season) 10/19/2023   DTaP/Tdap/Td (2 - Td or Tdap) 03/03/2028   Pneumonia Vaccine 2+ Years old  Completed   INFLUENZA VACCINE  Completed   DEXA SCAN  Completed   Zoster Vaccines- Shingrix  Completed   HPV VACCINES  Aged Out    Health Maintenance  Health Maintenance Due  Topic Date Due   Medicare Annual Wellness (AWV)  08/20/2023   MAMMOGRAM  09/09/2023    Colorectal cancer screening: No longer required.   Mammogram status: Completed 09/14/23. Repeat every year  Bone Density Status 09/14/23 at [facility] per patient  Lung Cancer Screening: (Low Dose CT Chest recommended if Age 47-80 years, 20 pack-year currently smoking OR have quit w/in 15years.) does not qualify.     Additional Screening:  Hepatitis C Screening: does not qualify; Completed NA age  Vision Screening: Recommended annual ophthalmology exams for early detection of glaucoma and other disorders of the eye. Is the patient up to date with their annual eye exam?  Yes  Who is the provider or what is the name of the office in which the patient attends annual eye exams? [facility], recently got new glasses If pt is not established with a provider, would they like to be referred to a provider to establish care? No .   Dental Screening: Recommended annual dental exams for proper oral hygiene    Community Resource  Referral / Chronic Care Management: CRR required this visit?  No   CCM required this visit?  No     Plan:     I have personally reviewed and noted the following in the patient's chart:   Medical and social history Use of alcohol, tobacco or illicit drugs  Current medications and supplements including opioid prescriptions. Patient is not currently taking opioid prescriptions. Functional ability and status Nutritional status Physical activity Advanced directives List of other physicians Hospitalizations, surgeries, and ER visits in previous 12 months Vitals Screenings to include cognitive, depression, and falls Referrals and appointments  In addition, I have reviewed and discussed with patient certain preventive protocols, quality metrics, and best practice recommendations. A written personalized care plan for preventive services as well as general preventive health recommendations were provided to patient.     Sydell Axon, LPN   [id number]   After Visit Summary: (Pick Up) Due to this being a telephonic visit, with patients personalized plan was offered to patient and patient has requested to Pick up at office.  Nurse Notes: None

## 2023-09-17 ENCOUNTER — Encounter: Payer: Self-pay | Admitting: Internal Medicine

## 2023-09-23 ENCOUNTER — Other Ambulatory Visit: Payer: Self-pay | Admitting: Internal Medicine

## 2023-09-27 DIAGNOSIS — R922 Inconclusive mammogram: Secondary | ICD-10-CM | POA: Diagnosis not present

## 2023-09-27 LAB — MM OUTSIDE FILMS MAMMO

## 2023-09-30 ENCOUNTER — Encounter: Payer: Self-pay | Admitting: Internal Medicine

## 2023-10-13 ENCOUNTER — Other Ambulatory Visit: Payer: Self-pay | Admitting: Internal Medicine

## 2023-11-18 ENCOUNTER — Ambulatory Visit: Payer: Medicare Other | Admitting: Internal Medicine

## 2023-11-18 ENCOUNTER — Encounter: Payer: Self-pay | Admitting: Internal Medicine

## 2023-11-18 VITALS — BP 126/84 | HR 67 | Temp 97.7°F | Ht 59.0 in | Wt 124.8 lb

## 2023-11-18 DIAGNOSIS — R011 Cardiac murmur, unspecified: Secondary | ICD-10-CM

## 2023-11-18 DIAGNOSIS — R7309 Other abnormal glucose: Secondary | ICD-10-CM

## 2023-11-18 DIAGNOSIS — I10 Essential (primary) hypertension: Secondary | ICD-10-CM | POA: Diagnosis not present

## 2023-11-18 DIAGNOSIS — E2839 Other primary ovarian failure: Secondary | ICD-10-CM

## 2023-11-18 DIAGNOSIS — R822 Biliuria: Secondary | ICD-10-CM

## 2023-11-18 DIAGNOSIS — H9201 Otalgia, right ear: Secondary | ICD-10-CM

## 2023-11-18 NOTE — Assessment & Plan Note (Signed)
Chronic, well controlled. She will continue with amlodipine 10mg  daily, carvedilol 12.5mg  twice daily, furosemide 20mg  daily and valsartan 160mg  daily. Encouraged to follow low sodium diet. She will rto in 4-6 months for re-evaluation.

## 2023-11-18 NOTE — Progress Notes (Unsigned)
 I,Victoria T Deloria Lair, CMA,acting as a Neurosurgeon for Gwynneth Aliment, MD.,have documented all relevant documentation on the behalf of Gwynneth Aliment, MD,as directed by  Gwynneth Aliment, MD while in the presence of Gwynneth Aliment, MD.  Subjective:  Patient ID: Erica Meadows , female    DOB: 06-21-1936 , 88 y.o.   MRN: [id number]  Chief Complaint  Patient presents with  . Hypertension  . Hyperthyroidism    HPI  Patient presents today for BP & thyroid check. She reports compliance with meds. She denies having any headaches, chest pain and shortness of breath. She states she has a good appetite. She has no specific concerns or complaints at this time.   Hypertension This is a chronic problem. The current episode started more than 1 year ago. The problem has been gradually improving since onset. The problem is controlled. Pertinent negatives include no blurred vision. Risk factors for coronary artery disease include post-menopausal state and sedentary lifestyle. Past treatments include calcium channel blockers, angiotensin blockers and diuretics. The current treatment provides moderate improvement.     Past Medical History:  Diagnosis Date  . High cholesterol   . Hyperparathyroidism (HCC)   . Hypertension    under control; has been on med. x 15-20 yr.  . Seasonal allergies   . Trigger thumb of left hand 12/2012     Family History  Problem Relation Age of Onset  . Hypertension Mother   . Stroke Mother   . Stroke Father   . Hypertension Father      Current Outpatient Medications:  .  acetaminophen (TYLENOL) 500 MG tablet, Take 500 mg by mouth every 6 (six) hours as needed. prn, Disp: , Rfl:  .  amLODipine (NORVASC) 10 MG tablet, Take 1 tablet (10 mg total) by mouth daily., Disp: 90 tablet, Rfl: 2 .  aspirin 81 MG tablet, Take 81 mg by mouth every evening. , Disp: , Rfl:  .  carvedilol (COREG) 12.5 MG tablet, TAKE 1 TABLET (12.5MG  TOTAL) BY MOUTH TWICE A DAY WITH MEALS, Disp: 180  tablet, Rfl: 2 .  cholecalciferol (VITAMIN D) 1000 units tablet, Take 5,000 Units by mouth 2 (two) times a week., Disp: , Rfl:  .  furosemide (LASIX) 20 MG tablet, TAKE 1 TABLET BY MOUTH EVERY DAY, Disp: 90 tablet, Rfl: 1 .  KLOR-CON M20 20 MEQ tablet, TAKE 1 TABLET BY MOUTH EVERY DAY, Disp: 90 tablet, Rfl: 1 .  rosuvastatin (CRESTOR) 10 MG tablet, TAKE 1 TABLET BY MOUTH EVERY DAY, Disp: 90 tablet, Rfl: 1 .  valsartan (DIOVAN) 160 MG tablet, TAKE 1 TABLET BY MOUTH EVERY DAY, Disp: 90 tablet, Rfl: 3   No Known Allergies   Review of Systems  Constitutional: Negative.   HENT:         She c/o right ear pain. Intermittent. She states sx started several months ago; however, she feels sx are worsening - more frequent and increased severity. She denies having any hearing deficit.   Eyes:  Negative for blurred vision.  Respiratory: Negative.    Cardiovascular: Negative.   Gastrointestinal: Negative.   Neurological: Negative.   Psychiatric/Behavioral: Negative.       Today's Vitals   11/18/23 1542  BP: 126/84  Pulse: 67  Temp: 97.7 F (36.5 C)  SpO2: 98%  Weight: 124 lb 12.8 oz (56.6 kg)  Height: 4\' 11"  (1.499 m)   Body mass index is 25.21 kg/m.  Wt Readings from Last 3 Encounters:  11/18/23 124 lb  12.8 oz (56.6 kg)  09/15/23 123 lb (55.8 kg)  08/24/23 122 lb (55.3 kg)     Objective:  Physical Exam Vitals and nursing note reviewed.  Constitutional:      Appearance: Normal appearance.  HENT:     Head: Normocephalic and atraumatic.  Eyes:     Extraocular Movements: Extraocular movements intact.  Cardiovascular:     Rate and Rhythm: Normal rate and regular rhythm.     Heart sounds: Normal heart sounds.  Pulmonary:     Effort: Pulmonary effort is normal.     Breath sounds: Normal breath sounds.  Musculoskeletal:     Cervical back: Normal range of motion.  Skin:    General: Skin is warm.  Neurological:     General: No focal deficit present.     Mental Status: She is  alert.  Psychiatric:        Mood and Affect: Mood normal.        Behavior: Behavior normal.        Assessment And Plan:  Essential hypertension Assessment & Plan: Chronic, well controlled. She will continue with amlodipine 10mg  daily, carvedilol 12.5mg  twice daily, furosemide 20mg  daily and valsartan 160mg  daily. Encouraged to follow low sodium diet. She will rto in 4-6 months for re-evaluation.      Orders: -     CMP14+EGFR  Other abnormal glucose Assessment & Plan: Previous labs reviewed, her A1c has been elevated in the past. I will check an A1c today. Reminded to avoid refined sugars including sugary drinks/foods and processed meats including bacon, sausages and deli meats.    Orders: -     Hemoglobin A1c  Hypercalcemia -     CMP14+EGFR -     Magnesium -     Calcium, ionized  Bilirubinuria  Right ear pain     Return if symptoms worsen or fail to improve.  Patient was given opportunity to ask questions. Patient verbalized understanding of the plan and was able to repeat key elements of the plan. All questions were answered to their satisfaction.    I, Gwynneth Aliment, MD, have reviewed all documentation for this visit. The documentation on 11/18/23 for the exam, diagnosis, procedures, and orders are all accurate and complete.   IF YOU HAVE BEEN REFERRED TO A SPECIALIST, IT MAY TAKE 1-2 WEEKS TO SCHEDULE/PROCESS THE REFERRAL. IF YOU HAVE NOT HEARD FROM US/SPECIALIST IN TWO WEEKS, PLEASE GIVE [place] A CALL AT [phone].   THE PATIENT IS ENCOURAGED TO PRACTICE SOCIAL DISTANCING DUE TO THE COVID-19 PANDEMIC.

## 2023-11-18 NOTE — Assessment & Plan Note (Signed)
Previous labs reviewed, her A1c has been elevated in the past. I will check an A1c today. Reminded to avoid refined sugars including sugary drinks/foods and processed meats including bacon, sausages and deli meats.

## 2023-11-18 NOTE — Patient Instructions (Signed)
Hypertension, Adult Hypertension is another name for high blood pressure. High blood pressure forces your heart to work harder to pump blood. This can cause problems over time. There are two numbers in a blood pressure reading. There is a top number (systolic) over a bottom number (diastolic). It is best to have a blood pressure that is below 120/80. What are the causes? The cause of this condition is not known. Some other conditions can lead to high blood pressure. What increases the risk? Some lifestyle factors can make you more likely to develop high blood pressure: Smoking. Not getting enough exercise or physical activity. Being overweight. Having too much fat, sugar, calories, or salt (sodium) in your diet. Drinking too much alcohol. Other risk factors include: Having any of these conditions: Heart disease. Diabetes. High cholesterol. Kidney disease. Obstructive sleep apnea. Having a family history of high blood pressure and high cholesterol. Age. The risk increases with age. Stress. What are the signs or symptoms? High blood pressure may not cause symptoms. Very high blood pressure (hypertensive crisis) may cause: Headache. Fast or uneven heartbeats (palpitations). Shortness of breath. Nosebleed. Vomiting or feeling like you may vomit (nauseous). Changes in how you see. Very bad chest pain. Feeling dizzy. Seizures. How is this treated? This condition is treated by making healthy lifestyle changes, such as: Eating healthy foods. Exercising more. Drinking less alcohol. Your doctor may prescribe medicine if lifestyle changes do not help enough and if: Your top number is above 130. Your bottom number is above 80. Your personal target blood pressure may vary. Follow these instructions at home: Eating and drinking  If told, follow the DASH eating plan. To follow this plan: Fill one half of your plate at each meal with fruits and vegetables. Fill one fourth of your plate  at each meal with whole grains. Whole grains include whole-wheat pasta, brown rice, and whole-grain bread. Eat or drink low-fat dairy products, such as skim milk or low-fat yogurt. Fill one fourth of your plate at each meal with low-fat (lean) proteins. Low-fat proteins include fish, chicken without skin, eggs, beans, and tofu. Avoid fatty meat, cured and processed meat, or chicken with skin. Avoid pre-made or processed food. Limit the amount of salt in your diet to less than 1,500 mg each day. Do not drink alcohol if: Your doctor tells you not to drink. You are pregnant, may be pregnant, or are planning to become pregnant. If you drink alcohol: Limit how much you have to: 0-1 drink a day for women. 0-2 drinks a day for men. Know how much alcohol is in your drink. In the [place] one drink equals one 12 oz bottle of beer (355 mL), one 5 oz glass of wine (148 mL), or one 1 oz glass of hard liquor (44 mL). Lifestyle  Work with your doctor to stay at a healthy weight or to lose weight. Ask your doctor what the best weight is for you. Get at least 30 minutes of exercise that causes your heart to beat faster (aerobic exercise) most days of the week. This may include walking, swimming, or biking. Get at least 30 minutes of exercise that strengthens your muscles (resistance exercise) at least 3 days a week. This may include lifting weights or doing Pilates. Do not smoke or use any products that contain nicotine or tobacco. If you need help quitting, ask your doctor. Check your blood pressure at home as told by your doctor. Keep all follow-up visits. Medicines Take over-the-counter and prescription medicines  only as told by your doctor. Follow directions carefully. Do not skip doses of blood pressure medicine. The medicine does not work as well if you skip doses. Skipping doses also puts you at risk for problems. Ask your doctor about side effects or reactions to medicines that you should watch  for. Contact a doctor if: You think you are having a reaction to the medicine you are taking. You have headaches that keep coming back. You feel dizzy. You have swelling in your ankles. You have trouble with your vision. Get help right away if: You get a very bad headache. You start to feel mixed up (confused). You feel weak or numb. You feel faint. You have very bad pain in your: Chest. Belly (abdomen). You vomit more than once. You have trouble breathing. These symptoms may be an emergency. Get help right away. Call 911. Do not wait to see if the symptoms will go away. Do not drive yourself to the hospital. Summary Hypertension is another name for high blood pressure. High blood pressure forces your heart to work harder to pump blood. For most people, a normal blood pressure is less than 120/80. Making healthy choices can help lower blood pressure. If your blood pressure does not get lower with healthy choices, you may need to take medicine. This information is not intended to replace advice given to you by your health care provider. Make sure you discuss any questions you have with your health care provider. Document Revised: 07/10/2021 Document Reviewed: 07/10/2021 Elsevier Patient Education  2024 [employer].

## 2023-11-19 LAB — HEMOGLOBIN A1C
Est. average glucose Bld gHb Est-mCnc: 126 mg/dL
Hgb A1c MFr Bld: 6 % — ABNORMAL HIGH (ref 4.8–5.6)

## 2023-11-19 LAB — CMP14+EGFR
ALT: 10 [IU]/L (ref 0–32)
AST: 24 [IU]/L (ref 0–40)
Albumin: 4.6 g/dL (ref 3.7–4.7)
Alkaline Phosphatase: 92 [IU]/L (ref 44–121)
BUN/Creatinine Ratio: 13 (ref 12–28)
BUN: 9 mg/dL (ref 8–27)
Bilirubin Total: 0.6 mg/dL (ref 0.0–1.2)
CO2: 28 mmol/L (ref 20–29)
Calcium: 10.6 mg/dL — ABNORMAL HIGH (ref 8.7–10.3)
Chloride: 101 mmol/L (ref 96–106)
Creatinine, Ser: 0.68 mg/dL (ref 0.57–1.00)
Globulin, Total: 2.4 g/dL (ref 1.5–4.5)
Glucose: 94 mg/dL (ref 70–99)
Potassium: 3.2 mmol/L — ABNORMAL LOW (ref 3.5–5.2)
Sodium: 143 mmol/L (ref 134–144)
Total Protein: 7 g/dL (ref 6.0–8.5)
eGFR: 85 mL/min/{1.73_m2} (ref >59)

## 2023-11-19 LAB — MAGNESIUM: Magnesium: 2.1 mg/dL (ref 1.6–2.3)

## 2023-11-19 LAB — CALCIUM, IONIZED: Calcium, Ion: 5.3 mg/dL (ref 4.5–5.6)

## 2023-11-20 ENCOUNTER — Other Ambulatory Visit: Payer: Self-pay | Admitting: Internal Medicine

## 2023-11-22 ENCOUNTER — Ambulatory Visit: Payer: Self-pay

## 2023-11-22 NOTE — Patient Instructions (Signed)
Visit Information  Thank you for taking time to visit with me today. Please don't hesitate to contact me if I can be of assistance to you.   Following are the goals we discussed today:   Goals Addressed             This Visit's Progress    COMPLETED: To increase daily water intake as directed       Care Coordination Interventions: Review of patient status, including review of consultant's reports, relevant laboratory and other test results     Educated patient regarding the importance to increase daily water intake, instructed to aim for 48-64 oz daily unless otherwise directed by your doctor  Last practice recorded BP readings:  BP Readings from Last 3 Encounters:  11/18/23 126/84  08/24/23 110/74  07/20/23 110/70   Most recent eGFR/CrCl:  Lab Results  Component Value Date   EGFR 85 11/18/2023    No components found for: "CRCL"      COMPLETED: To maintain and or lower A1c       Care Coordination Interventions: Provided education to patient about basic prediabetes disease process Review of patient status, including review of consultants reports, relevant laboratory and other test results, and medications completed Counseled on Diabetic diet, my plate method, portion control, 150 minutes of moderate intensity exercise/week Discussed plans to close patient from care coordination due to patient is managing her chronic health conditions and staying healthy Instructed patient notify her PCP to report new symptoms or concerns if they arise Lab Results  Component Value Date   HGBA1C 6.0 (H) 11/18/2023        COMPLETED: To maintain current BMI       Care Coordination Interventions: Evaluation of current treatment plan related to weight loss  and patient's adherence to plan as established by provider Counseled on the importance of exercise goals with target of 150 minutes per week Discussed plans with patient for closure from care coordination due to patient is stable and  managing her chronic conditions Instructed patient to keep her PCP informed of new symptoms or concerns  Body mass index is 25.21 kg/m.     Wt Readings from Last 3 Encounters:  11/18/23 124 lb 12.8 oz (56.6 kg)  09/15/23 123 lb (55.8 kg)  08/24/23 122 lb (55.3 kg)         If you are experiencing a Mental Health or Behavioral Health Crisis or need someone to talk to, please call 1-800-273-TALK (toll free, 24 hour hotline)  The patient verbalized understanding of instructions, educational materials, and care plan provided today and DECLINED offer to receive copy of patient instructions, educational materials, and care plan.   Delsa Sale RN BSN CCM [place]  [facility] [place], [facility] Nurse Care Coordinator  Direct Dial: [phone] Website: [email or web address]

## 2023-11-22 NOTE — Patient Outreach (Signed)
Care Coordination   Follow Up Visit Note   11/22/2023 Name: Erica Meadows MRN: [id number] DOB: 1936-05-09  Erica Meadows is a 88 y.o. year old female who sees Dorothyann Peng, MD for primary care. I spoke with  [name] by phone today.  What matters to the patients health and wellness today?  Patient would like to continue to mange her chronic health conditions without complications or hospitalization.     Goals Addressed             This Visit's Progress    COMPLETED: To increase daily water intake as directed       Care Coordination Interventions: Review of patient status, including review of consultant's reports, relevant laboratory and other test results     Educated patient regarding the importance to increase daily water intake, instructed to aim for 48-64 oz daily unless otherwise directed by your doctor  Last practice recorded BP readings:  BP Readings from Last 3 Encounters:  11/18/23 126/84  08/24/23 110/74  07/20/23 110/70   Most recent eGFR/CrCl:  Lab Results  Component Value Date   EGFR 85 11/18/2023    No components found for: "CRCL"      COMPLETED: To maintain and or lower A1c       Care Coordination Interventions: Provided education to patient about basic prediabetes disease process Review of patient status, including review of consultants reports, relevant laboratory and other test results, and medications completed Counseled on Diabetic diet, my plate method, portion control, 150 minutes of moderate intensity exercise/week Discussed plans to close patient from care coordination due to patient is managing her chronic health conditions and staying healthy Instructed patient notify her PCP to report new symptoms or concerns if they arise Lab Results  Component Value Date   HGBA1C 6.0 (H) 11/18/2023        COMPLETED: To maintain current BMI       Care Coordination Interventions: Evaluation of current treatment plan related to weight loss   and patient's adherence to plan as established by provider Counseled on the importance of exercise goals with target of 150 minutes per week Discussed plans with patient for closure from care coordination due to patient is stable and managing her chronic conditions Instructed patient to keep her PCP informed of new symptoms or concerns  Body mass index is 25.21 kg/m.     Wt Readings from Last 3 Encounters:  11/18/23 124 lb 12.8 oz (56.6 kg)  09/15/23 123 lb (55.8 kg)  08/24/23 122 lb (55.3 kg)      Interventions Today    Flowsheet Row Most Recent Value  Chronic Disease   Chronic disease during today's visit Diabetes, Other  [hypokalemia]  General Interventions   General Interventions Discussed/Reviewed General Interventions Discussed, General Interventions Reviewed, Labs, Doctor Visits  Doctor Visits Discussed/Reviewed Doctor Visits Reviewed, Doctor Visits Discussed, PCP  Exercise Interventions   Exercise Discussed/Reviewed Physical Activity, Exercise Reviewed, Exercise Discussed  Physical Activity Discussed/Reviewed Physical Activity Discussed, Physical Activity Reviewed  Education Interventions   Education Provided Provided Education, Provided Printed Education  Provided Verbal Education On When to see the doctor, Medication, Exercise, Labs, Nutrition  Labs Reviewed Kidney Function  Nutrition Interventions   Nutrition Discussed/Reviewed Nutrition Discussed, Nutrition Reviewed, Carbohydrate meal planning, Fluid intake, Portion sizes  Pharmacy Interventions   Pharmacy Dicussed/Reviewed Pharmacy Topics Reviewed, Pharmacy Topics Discussed          SDOH assessments and interventions completed:  Yes  SDOH Interventions Today  Flowsheet Row Most Recent Value  SDOH Interventions   Food Insecurity Interventions Intervention Not Indicated  Housing Interventions Intervention Not Indicated  Transportation Interventions Intervention Not Indicated  Utilities Interventions  Intervention Not Indicated        Care Coordination Interventions:  Yes, provided   Follow up plan: No further intervention required.   Encounter Outcome:  Patient Visit Completed

## 2023-11-23 LAB — SPECIMEN STATUS REPORT

## 2023-11-23 LAB — VITAMIN D 25 HYDROXY (VIT D DEFICIENCY, FRACTURES): Vit D, 25-Hydroxy: 47.9 ng/mL (ref 30.0–100.0)

## 2023-11-28 NOTE — Assessment & Plan Note (Signed)
 Chronic, she is not taking any calcium supplements.  She has stopped hydrochlorothiazide, but is now on furosemide. Most recent PTH is not elevated.   I will check labs as below. May need Endo evaluation for further workup.

## 2023-12-10 ENCOUNTER — Ambulatory Visit (HOSPITAL_COMMUNITY): Payer: Medicare Other | Attending: Internal Medicine

## 2023-12-10 DIAGNOSIS — R011 Cardiac murmur, unspecified: Secondary | ICD-10-CM | POA: Insufficient documentation

## 2023-12-10 LAB — ECHOCARDIOGRAM COMPLETE
Area-P 1/2: 3.42 cm2
P 1/2 time: 542 ms
S' Lateral: 1.9 cm

## 2023-12-13 ENCOUNTER — Other Ambulatory Visit: Payer: Self-pay | Admitting: Internal Medicine

## 2023-12-13 DIAGNOSIS — I5189 Other ill-defined heart diseases: Secondary | ICD-10-CM

## 2023-12-13 DIAGNOSIS — I358 Other nonrheumatic aortic valve disorders: Secondary | ICD-10-CM

## 2024-01-11 ENCOUNTER — Other Ambulatory Visit: Payer: Self-pay | Admitting: Internal Medicine

## 2024-01-28 ENCOUNTER — Ambulatory Visit (INDEPENDENT_AMBULATORY_CARE_PROVIDER_SITE_OTHER): Payer: Medicare Other | Admitting: Otolaryngology

## 2024-01-28 DIAGNOSIS — H9201 Otalgia, right ear: Secondary | ICD-10-CM

## 2024-01-28 DIAGNOSIS — M26621 Arthralgia of right temporomandibular joint: Secondary | ICD-10-CM | POA: Diagnosis not present

## 2024-01-28 NOTE — Progress Notes (Signed)
 ENT CONSULT:  Reason for Consult: R ear pain for several months   HPI: Discussed the use of AI scribe software for clinical note transcription with the patient, who gave verbal consent to proceed.  History of Present Illness Erica Meadows is an 88 year old female who presents with right ear pain.  She has been experiencing intermittent right ear pain for several months. The pain is sharp and brief, lasting only two to three seconds, and occurs sporadically without specific triggers. It can happen while she is in bed or during other activities. The intensity of the pain is significant enough to capture her attention, but it resolves quickly.  No changes in hearing, ear drainage, or history of ear surgeries or frequent ear infections. There is no associated tinnitus.   The pain feels like it is located near the ear but not directly in the ear canal    Records Reviewed:  11/18/23 PCP office visit Dr Elnita Hai, [facility] She c/o right ear pain. Intermittent. She states sx started several months ago; however, she feels sx are worsening - more frequent and increased severity. She denies having any hearing deficit.     Past Medical History:  Diagnosis Date   High cholesterol    Hyperparathyroidism (HCC)    Hypertension    under control; has been on med. x 15-20 yr.   Seasonal allergies    Trigger thumb of left hand 12/2012    Past Surgical History:  Procedure Laterality Date   ABDOMINAL HYSTERECTOMY  1975   partial   HAND  SURGERY  Left 03/2018   ANTERIOR  HAND ;  CUT INTO TENDON ON HAND WHILE CHOPPING CABBAGE ;      hysterectomy partial     PARATHYROIDECTOMY Right 06/16/2018   Procedure: RIGHT INFERIOR PARATHYROIDECTOMY;  Surgeon: Oralee Billow, MD;  Location: [facility];  Service: General;  Laterality: Right;   TRIGGER FINGER RELEASE Right    TRIGGER FINGER RELEASE Left 12/12/2012   Procedure: RELEASE TRIGGER FINGER/A-1 PULLEY LEFT THUMB;  Surgeon: Kemp Patter, MD;  Location: [facility];  Service: Orthopedics;  Laterality: Left;  ANESTHESIA: IV REGIONAL FAB    Family History  Problem Relation Age of Onset   Hypertension Mother    Stroke Mother    Stroke Father    Hypertension Father     Social History:  reports that she has never smoked. She has never used smokeless tobacco. She reports that she does not drink alcohol and does not use drugs.  Allergies: No Known Allergies  Medications: I have reviewed the patient's current medications.  The PMH, PSH, Medications, Allergies, and SH were reviewed and updated.  ROS: Constitutional: Negative for fever, weight loss and weight gain. Cardiovascular: Negative for chest pain and dyspnea on exertion. Respiratory: Is not experiencing shortness of breath at rest. Gastrointestinal: Negative for nausea and vomiting. Neurological: Negative for headaches. Psychiatric: The patient is not nervous/anxious  There were no vitals taken for this visit.  PHYSICAL EXAM:  Exam: General: Well-developed, well-nourished Respiratory Respiratory effort: Equal inspiration and expiration without stridor Cardiovascular Peripheral Vascular: Warm extremities with equal color/perfusion Eyes: No nystagmus with equal extraocular motion bilaterally Neuro/Psych/Balance: Patient oriented to person, place, and time; Appropriate mood and affect; Gait is intact with no imbalance; Cranial nerves I-XII are intact Head and Face Inspection: Normocephalic and atraumatic without mass or lesion Palpation: Facial skeleton intact without bony stepoffs Salivary Glands: No mass or tenderness Facial Strength: Facial motility symmetric and full bilaterally  ENT Pinna: External ear intact and fully developed External canal: Canal is patent with intact skin Tympanic Membrane: Clear and mobile External Nose: No scar or anatomic deformity Internal Nose: Septum intact and midline. No edema, polyp, or rhinorrhea Lips, Teeth, and gums: Mucosa and  teeth intact and viable TMJ: No pain to palpation with full mobility Oral cavity/oropharynx: No erythema or exudate, no lesions present Neck Neck and Trachea: Midline trachea without mass or lesion Thyroid : No mass or nodularity Lymphatics: No lymphadenopathy   Assessment/Plan: Encounter Diagnoses  Name Primary?   Right ear pain Yes   Arthralgia of right temporomandibular joint     Assessment and Plan Assessment & Plan TMJ pain/right otalgia  Intermittent right ear pain likely due to TMJ dysfunction. No hearing changes or ear infection. Ear exam is unremarkable today. Possible jaw joint arthritis or teeth clenching causing intermittent brief episodes of pain. Mild, infrequent pain suggests conservative management. - Recommend warm compresses to affected area. - Advise acetaminophen  for pain relief as needed. - Suggest soft diet on painful days. - Provided TMJ pain management handout.   Thank you for allowing me to participate in the care of this patient. Please do not hesitate to contact me with any questions or concerns.   Artice Last, MD Otolaryngology [facility] Phone: [phone] Fax: [phone]    01/28/2024, 11:29 AM

## 2024-01-28 NOTE — Patient Instructions (Signed)
  TMJ (Temporomandibular Joint Syndrome) The temporomandibular (tem-puh-roe-man-DIB-u-lur) joint (TMJ) acts like a sliding hinge, connecting your jawbone to your skull. You have one joint on each side of your jaw. TMJ disorders -- a type of temporomandibular disorder or TMD -- can cause pain in your jaw joint and in the muscles that control jaw movement.  The exact cause of a person's TMJ disorder is often difficult to determine. Your pain may be due to a combination of factors, such as genetics, arthritis or jaw injury. Some people who have jaw pain also tend to clench or grind their teeth (bruxism), although many people habitually clench or grind their teeth and never develop TMJ disorders.  In most cases, the pain and discomfort associated with TMJ disorders is temporary and can be relieved with self-managed care or nonsurgical treatments. This includes stress reduction, softer diet when the pain is present, anti-inflammatory pain medications such as Motrin and warm compresses.

## 2024-02-24 ENCOUNTER — Ambulatory Visit: Admitting: Internal Medicine

## 2024-03-10 ENCOUNTER — Other Ambulatory Visit: Payer: Self-pay | Admitting: Internal Medicine

## 2024-04-12 ENCOUNTER — Encounter: Payer: Self-pay | Admitting: Cardiology

## 2024-04-12 ENCOUNTER — Ambulatory Visit: Attending: Cardiology | Admitting: Cardiology

## 2024-04-12 VITALS — BP 150/64 | HR 56 | Ht 60.0 in | Wt 124.0 lb

## 2024-04-12 DIAGNOSIS — I10 Essential (primary) hypertension: Secondary | ICD-10-CM | POA: Diagnosis not present

## 2024-04-12 DIAGNOSIS — E7800 Pure hypercholesterolemia, unspecified: Secondary | ICD-10-CM

## 2024-04-12 DIAGNOSIS — R0989 Other specified symptoms and signs involving the circulatory and respiratory systems: Secondary | ICD-10-CM

## 2024-04-12 DIAGNOSIS — R600 Localized edema: Secondary | ICD-10-CM | POA: Diagnosis not present

## 2024-04-12 DIAGNOSIS — I119 Hypertensive heart disease without heart failure: Secondary | ICD-10-CM

## 2024-04-12 NOTE — Patient Instructions (Addendum)
 Medication Instructions:  No changes   *If you need a refill on your cardiac medications before your next appointment, please call your pharmacy*   Lab Work: Not needed    Testing/Procedures: 2nd floor of [facility] - building  Your physician has requested that you have a carotid duplex. This test is an ultrasound of the carotid arteries in your neck. It looks at blood flow through these arteries that supply the brain with blood. Allow one hour for this exam. There are no restrictions or special instructions.    Follow-Up: At [facility], you and your health needs are our priority.  As part of our continuing mission to provide you with exceptional heart care, we have created designated Provider Care Teams.  These Care Teams include your primary Cardiologist (physician) and Advanced Practice Providers (APPs -  Physician Assistants and Nurse Practitioners) who all work together to provide you with the care you need, when you need it.     Your next appointment:   2  to 3 month(s) ( after doppler is completed)   The format for your next appointment:   In Person  Provider:   Alm Clay, MD   Other Instructions    If all test look normal,you can cancel your follow up appointment

## 2024-04-12 NOTE — Progress Notes (Signed)
 Cardiology Office Note:  .   Date:  04/14/2024  ID:  Erica Meadows, DOB 1936/07/15, MRN [id number] PCP: Jarold Medici, MD   HeartCare Providers Cardiologist:  Alm Clay, MD     Chief Complaint  Patient presents with   New Patient (Initial Visit)    Referred for murmur, diastolic dysfunction and pulsating right carotid artery    Patient Profile: .     Erica Meadows is a very pleasant and otherwise healthy looking 88 y.o. female  with a PMH notable for longstanding hypertension who presents here for evaluation of Right Carotid Artery Pulsation, Heart Murmur and Diastolic Dysfunction at the request of Jarold Medici, MD.     Erica Meadows was last seen on fibber 23rd 2025 by Dr. Jarold for routine follow-up.  Murmur heard on exam.  Echo ordered.  Refer to cardiology  Subjective  Discussed the use of AI scribe software for clinical note transcription with the patient, who gave verbal consent to proceed.  History of Present Illness  History of Present Illness Erica Meadows is an 88 year old female with hypertension who presents with prominent right-sided pulsation and leg swelling. She was referred by her primary care physician for evaluation of a strong right-sided pulsation.  She has a strong pulsation on the right side of her neck, described as a 'real strong pumping' sensation, which is both visible and palpable. There is no similar sensation on the left side. This has been ongoing for some time, and her primary care physician noted the asymmetry. No associated pain, shortness of breath, heart racing, palpitations, chest pain, or dizziness. She experiences no shortness of breath during activities or when lying flat, and does not wake up short of breath at night.  She experiences significant swelling in her legs, which typically reduces with elevation. She sleeps with one pillow and does not wake up due to breathing difficulties, although she occasionally  wakes up due to snoring.  Her past medical history includes long-standing hypertension, for which she has been on medication for 20 to 30 years. She is currently taking amlodipine  10 mg daily.  She also reports symptoms suggestive of carpal tunnel syndrome, including pain and numbness in her right hand, particularly affecting three fingers, which has been present for some time but has not been formally diagnosed or treated.     Objective   Medications: Amlodipine  10 mg daily; carvedilol  12.5 mg twice daily; valsartan  160 on daily.  Furosemide  20 mg daily (Klor-Con  20 mg daily).  Aspirin 81 mg daily, Crestor 10 mg daily  Studies Reviewed: SABRA   EKG Interpretation Date/Time:  Wednesday April 12 2024 10:09:51 EDT Ventricular Rate:  56 PR Interval:  186 QRS Duration:  84 QT Interval:  446 QTC Calculation: 430 R Axis:   0  Text Interpretation: Sinus bradycardia Septal infarct , age undetermined When compared with ECG of 08-Dec-2012 16:12, Septal infarct is now Present Non-specific change in ST segment in Anterior leads Inverted T waves have replaced nonspecific T wave abnormality in Inferior leads Confirmed by Clay Alm ([id number]) on 04/12/2024 10:40:04 AM    Echocardiogram: EF 60 to 65%.  No RWMA.  GR 2 DD.  Mild LA dilation.  AOV sclerosis but no stenosis.  Normal RAP.  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 Echocardiogram 04/08/2018: EF 60 to 65%.  No RWMA.  Normal diastolic parameters.  Risk Assessment/Calculations:       Physical Exam:   VS:  BP (!) 150/64 (BP Location: Left Arm, Patient Position: Sitting, Cuff Size: Normal)   Pulse (!) 56   Ht 5' (1.524 m)   Wt 124 lb (56.2 kg)   SpO2 96%   BMI 24.22 kg/m    Wt Readings from Last 3 Encounters:  04/12/24 124 lb (56.2 kg)  11/18/23 124 lb 12.8 oz (56.6 kg)  09/15/23 123 lb (55.8 kg)    GEN: Healthy appearing.  Well nourished, well groomed in no acute distress; looks younger than stated age. NECK:  No JVD; generalized to the left carotid bruit; right carotid pulsation is definitely more prominent than compared to the left side. CARDIAC: Normal S1, S2; RRR, soft 1/6 SEM at RUSB but otherwise no additional murmurs, rubs, gallops RESPIRATORY:  Clear to auscultation without rales, wheezing or rhonchi ; nonlabored, good air movement. ABDOMEN: Soft, non-tender, non-distended EXTREMITIES:  No edema; No deformity      ASSESSMENT AND PLAN: .    Problem List Items Addressed This Visit       Cardiology Problems   Essential hypertension - Primary (Chronic)   Blood pressure is little high today and she does note that she was a little stressed out getting here to this appointment.  She is on multiple medications and I am reluctant to make any changes just meeting her today.  She is on Norvasc  10 mg daily, diet and 150 mg daily and carvedilol  12.5 mg twice daily along with standing dose of Lasix  20 mg with Klor-Con  20 mg.  Reassess in follow-up      Relevant Orders   EKG 12-Lead (Completed)   VAS US  CAROTID   Hyperlipidemia   Left ventricular hypertrophy due to hypertensive disease (Chronic)   Echocardiogram shows thickening likely due to hypertension. Normal ejection fraction. Low likelihood of significant issue. - Have imaging specialist review echocardiogram for comparison with previous studies. - Consider further testing for amyloidosis if significant changes in heart muscle thickness are noted.        Other   Left carotid bruit   Prominent right carotid pulse, possibly due to anatomical variation or previous surgery.  Also cannot exclude soft left carotid bruit. Differential includes carotid artery stenosis or dominance. - Order carotid ultrasound to assess for stenosis or anatomical variation.      Relevant Orders   VAS US  CAROTID   Lower extremity edema (Chronic)   Likely a side effect of amlodipine . No associated symptoms of PND orthopnea to suggest that is related to heart  failure.          Follow-Up: Return in about 3 months (around 07/13/2024) for To discuss test results, [employer].     Signed, Alm MICAEL Clay, MD, MS Alm Clay, M.D., M.S. Interventional Chartered certified accountant  Pager # [phone]

## 2024-04-14 ENCOUNTER — Encounter: Payer: Self-pay | Admitting: Cardiology

## 2024-04-14 DIAGNOSIS — I119 Hypertensive heart disease without heart failure: Secondary | ICD-10-CM | POA: Insufficient documentation

## 2024-04-14 NOTE — Assessment & Plan Note (Signed)
 Echocardiogram shows thickening likely due to hypertension. Normal ejection fraction. Low likelihood of significant issue. - Have imaging specialist review echocardiogram for comparison with previous studies. - Consider further testing for amyloidosis if significant changes in heart muscle thickness are noted.

## 2024-04-14 NOTE — Assessment & Plan Note (Signed)
 Blood pressure is little high today and she does note that she was a little stressed out getting here to this appointment.  She is on multiple medications and I am reluctant to make any changes just meeting her today.  She is on Norvasc  10 mg daily, diet and 150 mg daily and carvedilol  12.5 mg twice daily along with standing dose of Lasix  20 mg with Klor-Con  20 mg.  Reassess in follow-up

## 2024-04-14 NOTE — Assessment & Plan Note (Signed)
 Prominent right carotid pulse, possibly due to anatomical variation or previous surgery.  Also cannot exclude soft left carotid bruit. Differential includes carotid artery stenosis or dominance. - Order carotid ultrasound to assess for stenosis or anatomical variation.

## 2024-04-14 NOTE — Assessment & Plan Note (Signed)
 Likely a side effect of amlodipine . No associated symptoms of PND orthopnea to suggest that is related to heart failure.

## 2024-05-01 ENCOUNTER — Ambulatory Visit (HOSPITAL_COMMUNITY)
Admission: RE | Admit: 2024-05-01 | Discharge: 2024-05-01 | Disposition: A | Discharge status: Home / Self Care | Source: Ambulatory Visit | Attending: Cardiology | Admitting: Cardiology

## 2024-05-01 DIAGNOSIS — R0989 Other specified symptoms and signs involving the circulatory and respiratory systems: Secondary | ICD-10-CM | POA: Diagnosis not present

## 2024-05-01 DIAGNOSIS — I10 Essential (primary) hypertension: Secondary | ICD-10-CM | POA: Insufficient documentation

## 2024-05-06 ENCOUNTER — Ambulatory Visit: Payer: Self-pay | Admitting: Cardiology

## 2024-06-15 ENCOUNTER — Ambulatory Visit: Admitting: Cardiology

## 2024-06-20 ENCOUNTER — Ambulatory Visit: Admitting: Cardiology

## 2024-07-07 ENCOUNTER — Other Ambulatory Visit: Payer: Self-pay | Admitting: Internal Medicine

## 2024-07-20 ENCOUNTER — Ambulatory Visit: Payer: Self-pay | Admitting: Internal Medicine

## 2024-07-20 ENCOUNTER — Encounter: Payer: Self-pay | Admitting: Internal Medicine

## 2024-07-20 ENCOUNTER — Ambulatory Visit: Payer: Medicare Other | Admitting: Internal Medicine

## 2024-07-20 VITALS — BP 122/70 | HR 60 | Temp 98.0°F | Ht 60.0 in | Wt 125.6 lb

## 2024-07-20 DIAGNOSIS — H6121 Impacted cerumen, right ear: Secondary | ICD-10-CM | POA: Diagnosis not present

## 2024-07-20 DIAGNOSIS — Z0000 Encounter for general adult medical examination without abnormal findings: Secondary | ICD-10-CM | POA: Diagnosis not present

## 2024-07-20 DIAGNOSIS — I5032 Chronic diastolic (congestive) heart failure: Secondary | ICD-10-CM | POA: Diagnosis not present

## 2024-07-20 DIAGNOSIS — Z23 Encounter for immunization: Secondary | ICD-10-CM

## 2024-07-20 DIAGNOSIS — I110 Hypertensive heart disease with heart failure: Secondary | ICD-10-CM | POA: Diagnosis not present

## 2024-07-20 DIAGNOSIS — R7309 Other abnormal glucose: Secondary | ICD-10-CM | POA: Diagnosis not present

## 2024-07-20 LAB — POCT URINALYSIS DIP (CLINITEK)
Bilirubin, UA: NEGATIVE
Blood, UA: NEGATIVE
Glucose, UA: NEGATIVE mg/dL
Ketones, POC UA: NEGATIVE mg/dL
Leukocytes, UA: NEGATIVE
Nitrite, UA: NEGATIVE
POC PROTEIN,UA: NEGATIVE
Spec Grav, UA: 1.015 (ref 1.010–1.025)
Urobilinogen, UA: 1 U/dL
pH, UA: 7 (ref 5.0–8.0)

## 2024-07-20 NOTE — Patient Instructions (Signed)

## 2024-07-20 NOTE — Progress Notes (Signed)
 I,Erica Meadows, CMA,acting as a neurosurgeon for Erica LOISE Slocumb, MD.,have documented all relevant documentation on the behalf of Erica LOISE Slocumb, MD,as directed by  Erica LOISE Slocumb, MD while in the presence of Erica LOISE Slocumb, MD.  Subjective:  Patient ID: Erica Meadows , female    DOB: 1936-05-13 , 88 y.o.   MRN: [id number]  Chief Complaint  Patient presents with   Annual Exam    Patient presents today for her annual exam. She is no longer followed by GYN. She reports compliance with meds. She denies headaches, chest pain and shortness of breath. She offers no complaints. She is sch for December to complete mammogram.    Hypertension    HPI Discussed the use of AI scribe software for clinical note transcription with the patient, who gave verbal consent to proceed.  History of Present Illness Carey LELON Roane is an 88 year old female with hypertension who presents for a physical exam and blood pressure check.  She is currently on a regimen of amlodipine  10 mg daily, aspirin, carvedilol  twice daily, furosemide  20 mg, potassium once daily, rosuvastatin 10 mg, and valsartan  160 mg. She reported taking her blood pressure medication late today, but her blood pressure at home was around 120 mmHg. No shortness of breath, palpitations, or significant leg swelling. She experiences occasional leg swelling, which she attributes to amlodipine , and notes that it decreases after lying down for 15-20 minutes.   She reported wearing a jacket at today's visit due to the chilly weather. Her diet includes a good dinner with protein, occasional breakfast, and fruit snacks during the day. She engages in physical activity such as marching in place and heel raises.  She had a parathyroid  adenoma in 2019, with one gland removed. Her calcium levels have been elevated in the past, but the most recent parathyroid  hormone levels were normal. Her potassium was low in February, and she sometimes forgets to take her  potassium supplement, which she finds large and difficult to swallow.  She underwent an echocardiogram in March. No significant symptoms like palpitations or shortness of breath. A Doppler study of her neck showed no blockages.  She reports occasional ear pain lasting a few seconds, which was previously evaluated by an ENT with no findings. She uses Debrox for ear wax management.    Hypertension This is a chronic problem. The current episode started more than 1 year ago. The problem has been gradually improving since onset. The problem is controlled. Risk factors for coronary artery disease include post-menopausal state and sedentary lifestyle. Past treatments include calcium channel blockers, beta blockers, diuretics and angiotensin blockers. The current treatment provides moderate improvement. There are no compliance problems.      Past Medical History:  Diagnosis Date   High cholesterol    Hyperparathyroidism    Hypertension    under control; has been on med. x 15-20 yr.   Seasonal allergies    Trigger thumb of left hand 12/2012     Family History  Problem Relation Age of Onset   Hypertension Mother    Stroke Mother    Stroke Father    Hypertension Father      Current Outpatient Medications:    acetaminophen  (TYLENOL ) 500 MG tablet, Take 500 mg by mouth every 6 (six) hours as needed. prn, Disp: , Rfl:    amLODipine  (NORVASC ) 10 MG tablet, TAKE 1 TABLET BY MOUTH EVERY DAY, Disp: 90 tablet, Rfl: 2   aspirin 81 MG tablet, Take 81 mg  by mouth every evening. , Disp: , Rfl:    carvedilol  (COREG ) 12.5 MG tablet, TAKE 1 TABLET (12.5MG  TOTAL) BY MOUTH TWICE A DAY WITH MEALS, Disp: 180 tablet, Rfl: 2   cholecalciferol (VITAMIN D ) 1000 units tablet, Take 5,000 Units by mouth 2 (two) times a week., Disp: , Rfl:    furosemide  (LASIX ) 20 MG tablet, TAKE 1 TABLET BY MOUTH EVERY DAY, Disp: 90 tablet, Rfl: 1   KLOR-CON  M20 20 MEQ tablet, TAKE 1 TABLET BY MOUTH EVERY DAY, Disp: 90 tablet, Rfl:  1   rosuvastatin (CRESTOR) 10 MG tablet, TAKE 1 TABLET BY MOUTH EVERY DAY, Disp: 90 tablet, Rfl: 1   valsartan  (DIOVAN ) 160 MG tablet, TAKE 1 TABLET BY MOUTH EVERY DAY, Disp: 90 tablet, Rfl: 3   No Known Allergies   Review of Systems  Constitutional: Negative.   HENT: Negative.    Eyes: Negative.   Respiratory: Negative.    Cardiovascular: Negative.   Gastrointestinal: Negative.   Endocrine: Negative.   Genitourinary: Negative.   Musculoskeletal: Negative.   Skin: Negative.   Allergic/Immunologic: Negative.   Neurological: Negative.   Hematological: Negative.   Psychiatric/Behavioral: Negative.       Today's Vitals   07/20/24 1413  BP: 122/70  Pulse: 60  Temp: 98 F (36.7 C)  SpO2: 98%  Weight: 125 lb 9.6 oz (57 kg)  Height: 5' (1.524 m)   Body mass index is 24.53 kg/m.  Wt Readings from Last 3 Encounters:  07/20/24 125 lb 9.6 oz (57 kg)  04/12/24 124 lb (56.2 kg)  11/18/23 124 lb 12.8 oz (56.6 kg)     Objective:  Physical Exam Vitals and nursing note reviewed.  Constitutional:      Appearance: Normal appearance.  HENT:     Head: Normocephalic and atraumatic.     Right Ear: Ear canal and external ear normal. There is impacted cerumen.     Left Ear: Tympanic membrane, ear canal and external ear normal. There is no impacted cerumen.     Nose: Nose normal.     Mouth/Throat:     Mouth: Mucous membranes are moist.     Pharynx: Oropharynx is clear.  Eyes:     Extraocular Movements: Extraocular movements intact.     Conjunctiva/sclera: Conjunctivae normal.     Pupils: Pupils are equal, round, and reactive to light.  Cardiovascular:     Rate and Rhythm: Normal rate and regular rhythm.     Pulses: Normal pulses.     Heart sounds: Normal heart sounds.  Pulmonary:     Effort: Pulmonary effort is normal.     Breath sounds: Normal breath sounds.  Chest:  Breasts:    Tanner Score is 5.     Right: Normal.     Left: Normal.  Abdominal:     General: Abdomen is  flat. Bowel sounds are normal.     Palpations: Abdomen is soft.  Genitourinary:    Comments: deferred Musculoskeletal:        General: Normal range of motion.     Cervical back: Normal range of motion and neck supple.  Skin:    General: Skin is warm and dry.  Neurological:     General: No focal deficit present.     Mental Status: She is alert and oriented to person, place, and time.  Psychiatric:        Mood and Affect: Mood normal.        Behavior: Behavior normal.  Assessment And Plan:  Encounter for general adult medical examination w/o abnormal findings Assessment & Plan: A full exam was performed.  Importance of monthly self breast exams was discussed with the patient.  She is advised to get 30-45 minutes of regular exercise, no less than four to five days per week. Both weight-bearing and aerobic exercises are recommended.  She is advised to follow a healthy diet with at least six fruits/veggies per day, decrease intake of red meat and other saturated fats and to increase fish intake to twice weekly.  Meats/fish should not be fried -- baked, boiled or broiled is preferable. It is also important to cut back on your sugar intake.  Be sure to read labels - try to avoid anything with added sugar, high fructose corn syrup or other sweeteners.  If you must use a sweetener, you can try stevia or monkfruit.  It is also important to avoid artificially sweetened foods/beverages and diet drinks. Lastly, wear SPF 50 sunscreen on exposed skin and when in direct sunlight for an extended period of time.  Be sure to avoid fast food restaurants and aim for at least 60 ounces of water daily.       Hypertensive heart disease with chronic diastolic congestive heart failure (HCC) Assessment & Plan: Chronic, well controlled. EKG performed, SB w/ old anteroseptal infarct, nonspecific ST depression and nonspecific T abnormality.   She will continue with amlodipine  10mg  daily, carvedilol  12.5mg  twice  daily, furosemide  20mg  daily and valsartan  160mg  daily. Encouraged to follow low sodium diet. She will rto in 4-6 months for re-evaluation.    Heart function preserved, no symptoms of shortness of breath or fatigue. Previous tests showed functional aortic valve calcification. - Monitor for symptoms of worsening shortness of breath or leg swelling. - Follow up with cardiology if symptoms worsen.  Orders: -     POCT URINALYSIS DIP (CLINITEK) -     Microalbumin / creatinine urine ratio -     EKG 12-Lead -     CBC -     CMP14+EGFR -     Lipid panel -     TSH  Hypercalcemia Assessment & Plan: Previous parathyroid  adenoma treated with parathyroidectomy. Recent parathyroid  hormone levels normal. - Recheck calcium levels.  Orders: -     CMP14+EGFR -     PTH, intact and calcium -     Protein electrophoresis, serum  Right ear impacted cerumen Assessment & Plan: Right ear wax buildup with occasional pain. ENT evaluation showed no significant findings. - Use Debrox over-the-counter ear drops. - Schedule appointment if symptoms persist.   Other abnormal glucose Assessment & Plan: Previous labs reviewed, her A1c has been elevated in the past. I will check an A1c today. Reminded to avoid refined sugars including sugary drinks/foods and processed meats including bacon, sausages and deli meats.    Orders: -     CMP14+EGFR -     Hemoglobin A1c  Immunization due -     Flu vaccine HIGH DOSE PF(Fluzone Trivalent)   Return for 1 year HM, 6 MONTH BPC..  Patient was given opportunity to ask questions. Patient verbalized understanding of the plan and was able to repeat key elements of the plan. All questions were answered to their satisfaction.   I, Erica LOISE Slocumb, MD, have reviewed all documentation for this visit. The documentation on 07/20/24 for the exam, diagnosis, procedures, and orders are all accurate and complete.   IF YOU HAVE BEEN REFERRED TO A SPECIALIST, IT MAY  TAKE 1-2 WEEKS  TO SCHEDULE/PROCESS THE REFERRAL. IF YOU HAVE NOT HEARD FROM US /SPECIALIST IN TWO WEEKS, PLEASE GIVE US  A CALL AT [phone].   THE PATIENT IS ENCOURAGED TO PRACTICE SOCIAL DISTANCING DUE TO THE COVID-19 PANDEMIC.

## 2024-07-21 LAB — MICROALBUMIN / CREATININE URINE RATIO
Creatinine, Urine: 30.2 mg/dL
Microalb/Creat Ratio: 72 mg/g{creat} — ABNORMAL HIGH (ref 0–29)
Microalbumin, Urine: 21.8 ug/mL

## 2024-07-24 LAB — LIPID PANEL
Chol/HDL Ratio: 2 ratio (ref 0.0–4.4)
Cholesterol, Total: 166 mg/dL (ref 100–199)
HDL: 84 mg/dL (ref >39)
LDL Chol Calc (NIH): 69 mg/dL (ref 0–99)
Triglycerides: 65 mg/dL (ref 0–149)
VLDL Cholesterol Cal: 13 mg/dL (ref 5–40)

## 2024-07-24 LAB — CMP14+EGFR
ALT: 11 IU/L (ref 0–32)
AST: 23 IU/L (ref 0–40)
Albumin: 4.5 g/dL (ref 3.7–4.7)
Alkaline Phosphatase: 103 IU/L (ref 48–129)
BUN/Creatinine Ratio: 16 (ref 12–28)
BUN: 11 mg/dL (ref 8–27)
Bilirubin Total: 0.6 mg/dL (ref 0.0–1.2)
CO2: 26 mmol/L (ref 20–29)
Calcium: 10.3 mg/dL (ref 8.7–10.3)
Chloride: 101 mmol/L (ref 96–106)
Creatinine, Ser: 0.68 mg/dL (ref 0.57–1.00)
Globulin, Total: 2.3 g/dL (ref 1.5–4.5)
Glucose: 99 mg/dL (ref 70–99)
Potassium: 3.3 mmol/L — ABNORMAL LOW (ref 3.5–5.2)
Sodium: 142 mmol/L (ref 134–144)
Total Protein: 6.8 g/dL (ref 6.0–8.5)
eGFR: 85 mL/min/1.73 (ref >59)

## 2024-07-24 LAB — PROTEIN ELECTROPHORESIS, SERUM
A/G Ratio: 1.3 (ref 0.7–1.7)
Albumin ELP: 3.9 g/dL (ref 2.9–4.4)
Alpha 1: 0.3 g/dL (ref 0.0–0.4)
Alpha 2: 0.7 g/dL (ref 0.4–1.0)
Beta: 1.1 g/dL (ref 0.7–1.3)
Gamma Globulin: 0.9 g/dL (ref 0.4–1.8)
Globulin, Total: 2.9 g/dL (ref 2.2–3.9)

## 2024-07-24 LAB — CBC
Hematocrit: 38.3 % (ref 34.0–46.6)
Hemoglobin: 12.8 g/dL (ref 11.1–15.9)
MCH: 31.5 pg (ref 26.6–33.0)
MCHC: 33.4 g/dL (ref 31.5–35.7)
MCV: 94 fL (ref 79–97)
Platelets: 225 x10E3/uL (ref 150–450)
RBC: 4.06 x10E6/uL (ref 3.77–5.28)
RDW: 12.5 % (ref 11.7–15.4)
WBC: 5.5 x10E3/uL (ref 3.4–10.8)

## 2024-07-24 LAB — HEMOGLOBIN A1C
Est. average glucose Bld gHb Est-mCnc: 123 mg/dL
Hgb A1c MFr Bld: 5.9 % — ABNORMAL HIGH (ref 4.8–5.6)

## 2024-07-24 LAB — PTH, INTACT AND CALCIUM: PTH: 89 pg/mL — ABNORMAL HIGH (ref 15–65)

## 2024-07-24 LAB — TSH: TSH: 0.91 u[IU]/mL (ref 0.450–4.500)

## 2024-07-29 DIAGNOSIS — H6121 Impacted cerumen, right ear: Secondary | ICD-10-CM | POA: Insufficient documentation

## 2024-07-29 NOTE — Assessment & Plan Note (Signed)

## 2024-07-29 NOTE — Assessment & Plan Note (Addendum)
 Right ear wax buildup with occasional pain. ENT evaluation showed no significant findings. - Use Debrox over-the-counter ear drops. - Schedule appointment if symptoms persist.

## 2024-07-29 NOTE — Assessment & Plan Note (Signed)
 Previous labs reviewed, her A1c has been elevated in the past. I will check an A1c today. Reminded to avoid refined sugars including sugary drinks/foods and processed meats including bacon, sausages and deli meats.

## 2024-07-29 NOTE — Assessment & Plan Note (Signed)
 Previous parathyroid  adenoma treated with parathyroidectomy. Recent parathyroid  hormone levels normal. - Recheck calcium levels.

## 2024-07-29 NOTE — Assessment & Plan Note (Addendum)
 Chronic, well controlled. EKG performed, SB w/ old anteroseptal infarct, nonspecific ST depression and nonspecific T abnormality.   She will continue with amlodipine  10mg  daily, carvedilol  12.5mg  twice daily, furosemide  20mg  daily and valsartan  160mg  daily. Encouraged to follow low sodium diet. She will rto in 4-6 months for re-evaluation.    Heart function preserved, no symptoms of shortness of breath or fatigue. Previous tests showed functional aortic valve calcification. - Monitor for symptoms of worsening shortness of breath or leg swelling. - Follow up with cardiology if symptoms worsen.

## 2024-08-05 ENCOUNTER — Other Ambulatory Visit: Payer: Self-pay | Admitting: Internal Medicine

## 2024-09-07 ENCOUNTER — Other Ambulatory Visit: Payer: Self-pay | Admitting: Internal Medicine

## 2024-09-17 ENCOUNTER — Other Ambulatory Visit: Payer: Self-pay | Admitting: Internal Medicine

## 2024-09-19 LAB — HM MAMMOGRAPHY

## 2024-09-20 ENCOUNTER — Ambulatory Visit: Payer: Self-pay

## 2024-09-20 VITALS — Ht 60.0 in | Wt 125.9 lb

## 2024-09-20 DIAGNOSIS — Z0000 Encounter for general adult medical examination without abnormal findings: Secondary | ICD-10-CM | POA: Diagnosis not present

## 2024-09-20 NOTE — Patient Instructions (Signed)
 [name],  Thank you for taking the time for your Medicare Wellness Visit. I appreciate your continued commitment to your health goals. Please review the care plan we discussed, and feel free to reach out if I can assist you further.  Please note that Annual Wellness Visits do not include a physical exam. Some assessments may be limited, especially if the visit was conducted virtually. If needed, we may recommend an in-person follow-up with your provider.  Ongoing Care Seeing your primary care provider every 3 to 6 months helps us  monitor your health and provide consistent, personalized care.   Referrals If a referral was made during today's visit and you haven't received any updates within two weeks, please contact the referred provider directly to check on the status.  Recommended Screenings:  Health Maintenance  Topic Date Due   COVID-19 Vaccine (7 - 2025-26 season) 06/05/2024   Medicare Annual Wellness Visit  09/14/2024   Breast Cancer Screening  09/13/2024   DTaP/Tdap/Td vaccine (2 - Td or Tdap) 03/03/2028   Pneumococcal Vaccine for age over 65  Completed   Flu Shot  Completed   Osteoporosis screening with Bone Density Scan  Completed   Zoster (Shingles) Vaccine  Completed   Meningitis B Vaccine  Aged Out       09/20/2024   12:09 PM  Advanced Directives  Does Patient Have a Medical Advance Directive? Yes  Type of Estate Agent of [place];Living will  Does patient want to make changes to medical advance directive? No - Patient declined  Copy of Healthcare Power of Attorney in Chart? No - copy requested    Vision: Annual vision screenings are recommended for early detection of glaucoma, cataracts, and diabetic retinopathy. These exams can also reveal signs of chronic conditions such as diabetes and high blood pressure.  Dental: Annual dental screenings help detect early signs of oral cancer, gum disease, and other conditions linked to overall health,  including heart disease and diabetes.  Please see the attached documents for additional preventive care recommendations.

## 2024-09-20 NOTE — Progress Notes (Signed)
 Chief Complaint  Patient presents with   Medicare Wellness     Subjective:   Lakashia Collison Trent is a 88 y.o. female who presents for a Medicare Annual Wellness Visit.  Visit info / Clinical Intake: Medicare Wellness Visit Type:: Subsequent Annual Wellness Visit Persons participating in visit and providing information:: patient Medicare Wellness Visit Mode:: Telephone If telephone:: video declined Since this visit was completed virtually, some vitals may be partially provided or unavailable. Missing vitals are due to the limitations of the virtual format.: Documented vitals are patient reported If Telephone or Video please confirm:: I connected with patient using audio/video enable telemedicine. I verified patient identity with two identifiers, discussed telehealth limitations, and patient agreed to proceed. Patient Location:: home Provider Location:: office Interpreter Needed?: No Pre-visit prep was completed: yes AWV questionnaire completed by patient prior to visit?: no Living arrangements:: lives with spouse/significant other Patient's Overall Health Status Rating: (!) fair Typical amount of pain: some Does pain affect daily life?: no Are you currently prescribed opioids?: no  Dietary Habits and Nutritional Risks How many meals a day?: 2 Eats fruit and vegetables daily?: yes Most meals are obtained by: preparing own meals In the last 2 weeks, have you had any of the following?: none Diabetic:: no  Functional Status Activities of Daily Living (to include ambulation/medication): Independent Ambulation: Independent Medication Administration: Independent Home Management (perform basic housework or laundry): Independent Manage your own finances?: yes Primary transportation is: driving Concerns about vision?: no *vision screening is required for WTM* Concerns about hearing?: no  Fall Screening Falls in the past year?: 0 Number of falls in past year: 0 Was there an injury  with Fall?: 0 Fall Risk Category Calculator: 0 Patient Fall Risk Level: Low Fall Risk  Fall Risk Patient at Risk for Falls Due to: Medication side effect Fall risk Follow up: Falls evaluation completed; Falls prevention discussed  Home and Transportation Safety: All rugs have non-skid backing?: N/A, no rugs All stairs or steps have railings?: yes Grab bars in the bathtub or shower?: yes Have non-skid surface in bathtub or shower?: (!) no Good home lighting?: yes Regular seat belt use?: yes Hospital stays in the last year:: no  Cognitive Assessment Difficulty concentrating, remembering, or making decisions? : no Will 6CIT or Mini Cog be Completed: yes What year is it?: 0 points What month is it?: 0 points Give patient an address phrase to remember (5 components): [id number] About what time is it?: 0 points Count backwards from 20 to 1: 0 points Say the months of the year in reverse: 0 points Repeat the address phrase from earlier: 0 points 6 CIT Score: 0 points  Advance Directives (For Healthcare) Does Patient Have a Medical Advance Directive?: Yes Does patient want to make changes to medical advance directive?: No - Patient declined Type of Advance Directive: Healthcare Power of [place]; Living will Copy of Healthcare Power of Attorney in Chart?: No - copy requested Copy of Living Will in Chart?: No - copy requested  Reviewed/Updated  Reviewed/Updated: Reviewed All (Medical, Surgical, Family, Medications, Allergies, Care Teams, Patient Goals)    Allergies (verified) Patient has no known allergies.   Current Medications (verified) Outpatient Encounter Medications as of 09/20/2024  Medication Sig   acetaminophen  (TYLENOL ) 500 MG tablet Take 500 mg by mouth every 6 (six) hours as needed. prn   amLODipine  (NORVASC ) 10 MG tablet TAKE 1 TABLET BY MOUTH EVERY DAY   aspirin 81 MG tablet Take 81 mg  by mouth every evening.    carvedilol  (COREG ) 12.5 MG tablet TAKE 1  TABLET (12.5MG  TOTAL) BY MOUTH TWICE A DAY WITH MEALS   cholecalciferol (VITAMIN D ) 1000 units tablet Take 5,000 Units by mouth 2 (two) times a week.   furosemide  (LASIX ) 20 MG tablet TAKE 1 TABLET BY MOUTH EVERY DAY   potassium chloride  SA (KLOR-CON  M) 20 MEQ tablet TAKE 1 TABLET BY MOUTH EVERY DAY   rosuvastatin (CRESTOR) 10 MG tablet TAKE 1 TABLET BY MOUTH EVERY DAY   valsartan  (DIOVAN ) 160 MG tablet TAKE 1 TABLET BY MOUTH EVERY DAY   No facility-administered encounter medications on file as of 09/20/2024.    History: Past Medical History:  Diagnosis Date   High cholesterol    Hyperparathyroidism    Hypertension    under control; has been on med. x 15-20 yr.   Seasonal allergies    Trigger thumb of left hand 12/2012   Past Surgical History:  Procedure Laterality Date   ABDOMINAL HYSTERECTOMY  1975   partial   HAND  SURGERY  Left 03/2018   ANTERIOR  HAND ;  CUT INTO TENDON ON HAND WHILE CHOPPING CABBAGE ;      hysterectomy partial     PARATHYROIDECTOMY Right 06/16/2018   Procedure: RIGHT INFERIOR PARATHYROIDECTOMY;  Surgeon: Eletha Boas, MD;  Location: [facility];  Service: General;  Laterality: Right;   TRIGGER FINGER RELEASE Right    TRIGGER FINGER RELEASE Left 12/12/2012   Procedure: RELEASE TRIGGER FINGER/A-1 PULLEY LEFT THUMB;  Surgeon: Arley JONELLE Curia, MD;  Location:  [facility];  Service: Orthopedics;  Laterality: Left;  ANESTHESIA: IV REGIONAL FAB   Family History  Problem Relation Age of Onset   Hypertension Mother    Stroke Mother    Stroke Father    Hypertension Father    Social History   Occupational History   Occupation: retired  Tobacco Use   Smoking status: Never   Smokeless tobacco: Never  Vaping Use   Vaping status: Never Used  Substance and Sexual Activity   Alcohol use: No   Drug use: No   Sexual activity: Not Currently   Tobacco Counseling Counseling given: Not Answered  SDOH Screenings   Food Insecurity: No Food Insecurity  (09/20/2024)  Housing: Unknown (09/20/2024)  Transportation Needs: No Transportation Needs (09/20/2024)  Utilities: Not At Risk (09/20/2024)  Alcohol Screen: Low Risk (09/20/2024)  Depression (PHQ2-9): Low Risk (09/20/2024)  Financial Resource Strain: Low Risk (09/20/2024)  Physical Activity: Inactive (09/20/2024)  Social Connections: Moderately Integrated (09/20/2024)  Stress: No Stress Concern Present (09/20/2024)  Tobacco Use: Low Risk (09/20/2024)  Health Literacy: Adequate Health Literacy (09/20/2024)   See flowsheets for full screening details  Depression Screen PHQ 2 & 9 Depression Scale- Over the past 2 weeks, how often have you been bothered by any of the following problems? Little interest or pleasure in doing things: 0 Feeling down, depressed, or hopeless (PHQ Adolescent also includes...irritable): 0 PHQ-2 Total Score: 0 Trouble falling or staying asleep, or sleeping too much: 0 Feeling tired or having little energy: 0 Poor appetite or overeating (PHQ Adolescent also includes...weight loss): 0 Feeling bad about yourself - or that you are a failure or have let yourself or your family down: 0 Trouble concentrating on things, such as reading the newspaper or watching television (PHQ Adolescent also includes...like school work): 0 Moving or speaking so slowly that other people could have noticed. Or the opposite - being so fidgety or restless that you have  been moving around a lot more than usual: 0 Thoughts that you would be better off dead, or of hurting yourself in some way: 0 PHQ-9 Total Score: 0 If you checked off any problems, how difficult have these problems made it for you to do your work, take care of things at home, or get along with other people?: Not difficult at all  Depression Treatment Depression Interventions/Treatment : EYV7-0 Score <4 Follow-up Not Indicated     Goals Addressed             This Visit's Progress    Patient Stated       09/20/2024,  maintain weight             Objective:    Today's Vitals   09/20/24 1203  Height: 5' (1.524 m)   Body mass index is 24.53 kg/m.  Hearing/Vision screen Hearing Screening - Comments:: Denies hearing issues Vision Screening - Comments:: Regular eye exams Immunizations and Health Maintenance Health Maintenance  Topic Date Due   COVID-19 Vaccine (7 - 2025-26 season) 06/05/2024   Mammogram  09/13/2024   Medicare Annual Wellness (AWV)  09/20/2025   DTaP/Tdap/Td (2 - Td or Tdap) 03/03/2028   Pneumococcal Vaccine: 50+ Years  Completed   Influenza Vaccine  Completed   Bone Density Scan  Completed   Zoster Vaccines- Shingrix   Completed   Meningococcal B Vaccine  Aged Out        Assessment/Plan:  This is a routine wellness examination for Lysa.  Patient Care Team: Jarold Medici, MD as PCP - General (Internal Medicine) Anner Alm ORN, MD as PCP - Cardiology (Cardiology) Livingston Rigg, MD as Consulting Physician (Dermatology)  I have personally reviewed and noted the following in the patients chart:   Medical and social history Use of alcohol, tobacco or illicit drugs  Current medications and supplements including opioid prescriptions. Functional ability and status Nutritional status Physical activity Advanced directives List of other physicians Hospitalizations, surgeries, and ER visits in previous 12 months Vitals Screenings to include cognitive, depression, and falls Referrals and appointments  No orders of the defined types were placed in this encounter.  In addition, I have reviewed and discussed with patient certain preventive protocols, quality metrics, and best practice recommendations. A written personalized care plan for preventive services as well as general preventive health recommendations were provided to patient.   Ardella FORBES Dawn, LPN   [id number]   Return in 1 year (on 09/20/2025).  After Visit Summary: (Pick Up) Due to this being a  telephonic visit, with patients personalized plan was offered to patient and patient has requested to Pick up at office.  Nurse Notes: Screenings not ordered: Already had mammogram  screening - Records requested - Awaiting results HM Addressed: Vaccines Due: covid

## 2024-09-26 ENCOUNTER — Encounter: Payer: Self-pay | Admitting: Internal Medicine

## 2024-10-23 ENCOUNTER — Ambulatory Visit: Payer: Self-pay

## 2024-10-23 NOTE — Telephone Encounter (Signed)
 FYI Only or Action Required?: FYI only for provider: home care advised.  Patient was last seen in primary care on 07/20/2024 by Jarold Medici, MD.  Called Nurse Triage reporting Leg Pain.  Symptoms began a week ago.  Interventions attempted: OTC medications: tylenol , motrin.  Symptoms are: unchanged.  Triage Disposition: Home Care  Patient/caregiver understands and will follow disposition?: Yes   Reason for Triage: Pt is experiencing worsening pain around Groin/leg area. This is affecting daily activities such as getting in and out of car, toilet, putting a sock on. Transferred to [facility]    Reason for Disposition  Caused by strained muscle  Leg pain  Answer Assessment - Initial Assessment Questions 1. ONSET: When did the pain start?      1-2 weeks ago  2. LOCATION: Where is the pain located?      Right upper thigh/right pubic area  3. PAIN: How bad is the pain?    (Scale 1-10; or mild, moderate, severe)     Sharp pain, made worse when she sits down on her toilet or gets into the car, gets my attention, ouch, then I'm okay.  4. WORK OR EXERCISE: Has there been any recent work or exercise that involved this part of the body?      I just don't remember, if I bent down to reach something, I'm 88.   5. CAUSE: What do you think is causing the leg pain?     Pt unsure  6. OTHER SYMPTOMS: Do you have any other symptoms? (e.g., chest pain, back pain, breathing difficulty, swelling, rash, fever, numbness, weakness)     Denies  Protocols used: Leg Pain-A-AH

## 2025-01-18 ENCOUNTER — Ambulatory Visit: Payer: Self-pay | Admitting: Internal Medicine

## 2025-07-31 ENCOUNTER — Encounter: Payer: Self-pay | Admitting: Internal Medicine

## 2025-10-03 ENCOUNTER — Ambulatory Visit: Payer: Self-pay
